# Patient Record
Sex: Female | Born: 1955 | Race: Black or African American | Hispanic: No | Marital: Single | Smoking: Never smoker
Health system: Southern US, Community
[De-identification: ages and names within clinical notes are randomized; demographics above are authoritative.]

## PROBLEM LIST (undated history)

## (undated) DIAGNOSIS — Z8659 Personal history of other mental and behavioral disorders: Secondary | ICD-10-CM

## (undated) DIAGNOSIS — F209 Schizophrenia, unspecified: Secondary | ICD-10-CM

## (undated) DIAGNOSIS — E109 Type 1 diabetes mellitus without complications: Secondary | ICD-10-CM

## (undated) DIAGNOSIS — E785 Hyperlipidemia, unspecified: Secondary | ICD-10-CM

## (undated) DIAGNOSIS — K219 Gastro-esophageal reflux disease without esophagitis: Secondary | ICD-10-CM

## (undated) DIAGNOSIS — D369 Benign neoplasm, unspecified site: Secondary | ICD-10-CM

## (undated) HISTORY — DX: Type 1 diabetes mellitus without complications: E10.9

## (undated) HISTORY — DX: Gastro-esophageal reflux disease without esophagitis: K21.9

## (undated) HISTORY — DX: Hyperlipidemia, unspecified: E78.5

## (undated) HISTORY — DX: Benign neoplasm, unspecified site: D36.9

## (undated) HISTORY — DX: Personal history of other mental and behavioral disorders: Z86.59

---

## 2002-05-18 ENCOUNTER — Other Ambulatory Visit: Admission: RE | Admit: 2002-05-18 | Discharge: 2002-05-18 | Payer: Self-pay | Admitting: Family Medicine

## 2002-10-11 ENCOUNTER — Encounter: Payer: Self-pay | Admitting: Family Medicine

## 2002-10-11 ENCOUNTER — Ambulatory Visit (HOSPITAL_COMMUNITY): Admission: RE | Admit: 2002-10-11 | Discharge: 2002-10-11 | Payer: Self-pay | Admitting: Family Medicine

## 2003-12-05 ENCOUNTER — Ambulatory Visit: Payer: Self-pay | Admitting: Family Medicine

## 2004-05-13 ENCOUNTER — Ambulatory Visit: Payer: Self-pay | Admitting: Family Medicine

## 2004-05-20 ENCOUNTER — Ambulatory Visit (HOSPITAL_COMMUNITY): Admission: RE | Admit: 2004-05-20 | Discharge: 2004-05-20 | Payer: Self-pay | Admitting: Family Medicine

## 2004-06-25 ENCOUNTER — Ambulatory Visit: Payer: Self-pay | Admitting: Family Medicine

## 2004-07-17 ENCOUNTER — Encounter (INDEPENDENT_AMBULATORY_CARE_PROVIDER_SITE_OTHER): Payer: Self-pay | Admitting: General Surgery

## 2004-07-17 ENCOUNTER — Ambulatory Visit (HOSPITAL_COMMUNITY): Admission: RE | Admit: 2004-07-17 | Discharge: 2004-07-17 | Payer: Self-pay | Admitting: General Surgery

## 2004-08-07 ENCOUNTER — Ambulatory Visit: Payer: Self-pay | Admitting: Family Medicine

## 2004-08-12 ENCOUNTER — Ambulatory Visit (HOSPITAL_COMMUNITY): Admission: RE | Admit: 2004-08-12 | Discharge: 2004-08-12 | Payer: Self-pay | Admitting: Family Medicine

## 2005-03-11 ENCOUNTER — Ambulatory Visit: Payer: Self-pay | Admitting: Family Medicine

## 2005-04-06 ENCOUNTER — Ambulatory Visit: Payer: Self-pay | Admitting: Family Medicine

## 2005-04-08 ENCOUNTER — Ambulatory Visit (HOSPITAL_COMMUNITY): Admission: RE | Admit: 2005-04-08 | Discharge: 2005-04-08 | Payer: Self-pay | Admitting: Family Medicine

## 2005-08-12 ENCOUNTER — Other Ambulatory Visit: Admission: RE | Admit: 2005-08-12 | Discharge: 2005-08-12 | Payer: Self-pay | Admitting: Family Medicine

## 2005-08-12 ENCOUNTER — Encounter: Payer: Self-pay | Admitting: Family Medicine

## 2005-08-12 ENCOUNTER — Encounter (INDEPENDENT_AMBULATORY_CARE_PROVIDER_SITE_OTHER): Payer: Self-pay | Admitting: *Deleted

## 2005-08-12 ENCOUNTER — Ambulatory Visit: Payer: Self-pay | Admitting: Family Medicine

## 2005-08-18 ENCOUNTER — Ambulatory Visit (HOSPITAL_COMMUNITY): Admission: RE | Admit: 2005-08-18 | Discharge: 2005-08-18 | Payer: Self-pay | Admitting: Family Medicine

## 2005-10-02 ENCOUNTER — Encounter (HOSPITAL_COMMUNITY): Admission: RE | Admit: 2005-10-02 | Discharge: 2005-10-17 | Payer: Self-pay | Admitting: Family Medicine

## 2005-11-12 ENCOUNTER — Ambulatory Visit: Payer: Self-pay | Admitting: Family Medicine

## 2006-02-17 ENCOUNTER — Ambulatory Visit: Payer: Self-pay | Admitting: Family Medicine

## 2006-02-17 LAB — CONVERTED CEMR LAB: Microalb, Ur: 0.44 mg/dL (ref 0.00–1.89)

## 2006-02-25 ENCOUNTER — Encounter (INDEPENDENT_AMBULATORY_CARE_PROVIDER_SITE_OTHER): Payer: Self-pay | Admitting: *Deleted

## 2006-02-25 ENCOUNTER — Other Ambulatory Visit: Admission: RE | Admit: 2006-02-25 | Discharge: 2006-02-25 | Payer: Self-pay | Admitting: General Surgery

## 2006-05-19 ENCOUNTER — Ambulatory Visit: Payer: Self-pay | Admitting: Family Medicine

## 2006-05-26 ENCOUNTER — Ambulatory Visit: Payer: Self-pay | Admitting: Family Medicine

## 2006-05-28 ENCOUNTER — Encounter: Payer: Self-pay | Admitting: Family Medicine

## 2006-05-28 LAB — CONVERTED CEMR LAB
ALT: 10 units/L (ref 0–35)
AST: 14 units/L (ref 0–37)
Albumin: 3.8 g/dL (ref 3.5–5.2)
Basophils Absolute: 0 10*3/uL (ref 0.0–0.1)
Bilirubin, Direct: <0.1 mg/dL (ref 0.0–0.3)
CO2: 32 meq/L (ref 19–32)
Calcium: 9.3 mg/dL (ref 8.4–10.5)
Cholesterol: 102 mg/dL (ref 0–200)
Eosinophils Absolute: 0.1 10*3/uL (ref 0.0–0.7)
Eosinophils Relative: 2 % (ref 0–5)
Glucose, Bld: 81 mg/dL (ref 70–99)
HCT: 37.7 % (ref 36.0–46.0)
HDL: 43 mg/dL (ref >39)
Hgb A1c MFr Bld: 6.2 % — ABNORMAL HIGH (ref 4.6–6.1)
Lymphocytes Relative: 46 % (ref 12–46)
Lymphs Abs: 1.9 10*3/uL (ref 0.7–3.3)
Neutrophils Relative %: 43 % (ref 43–77)
Platelets: 331 10*3/uL (ref 150–400)
Potassium: 3.7 meq/L (ref 3.5–5.3)
RDW: 13.4 % (ref 11.5–14.0)
Sodium: 141 meq/L (ref 135–145)
TSH: 1.233 microintl units/mL (ref 0.350–5.50)
Total CHOL/HDL Ratio: 2.4
VLDL: 16 mg/dL (ref 0–40)
WBC: 4.1 10*3/uL (ref 4.0–10.5)

## 2006-06-04 ENCOUNTER — Ambulatory Visit (HOSPITAL_COMMUNITY): Admission: RE | Admit: 2006-06-04 | Discharge: 2006-06-04 | Payer: Self-pay | Admitting: Family Medicine

## 2006-08-03 ENCOUNTER — Ambulatory Visit: Payer: Self-pay | Admitting: Family Medicine

## 2006-08-18 ENCOUNTER — Other Ambulatory Visit: Admission: RE | Admit: 2006-08-18 | Discharge: 2006-08-18 | Payer: Self-pay | Admitting: Family Medicine

## 2006-08-18 ENCOUNTER — Encounter: Payer: Self-pay | Admitting: Family Medicine

## 2006-08-18 ENCOUNTER — Ambulatory Visit: Payer: Self-pay | Admitting: Family Medicine

## 2006-08-18 ENCOUNTER — Encounter (INDEPENDENT_AMBULATORY_CARE_PROVIDER_SITE_OTHER): Payer: Self-pay | Admitting: *Deleted

## 2006-09-10 ENCOUNTER — Ambulatory Visit (HOSPITAL_COMMUNITY): Admission: RE | Admit: 2006-09-10 | Discharge: 2006-09-10 | Payer: Self-pay | Admitting: Family Medicine

## 2006-09-10 ENCOUNTER — Ambulatory Visit: Payer: Self-pay | Admitting: Family Medicine

## 2006-10-13 ENCOUNTER — Ambulatory Visit: Payer: Self-pay | Admitting: Family Medicine

## 2006-11-18 ENCOUNTER — Ambulatory Visit: Payer: Self-pay | Admitting: Family Medicine

## 2006-11-19 ENCOUNTER — Encounter: Payer: Self-pay | Admitting: Family Medicine

## 2006-11-19 LAB — CONVERTED CEMR LAB
ALT: 10 units/L (ref 0–35)
AST: 13 units/L (ref 0–37)
Albumin: 4.2 g/dL (ref 3.5–5.2)
Alkaline Phosphatase: 39 units/L (ref 39–117)
BUN: 8 mg/dL (ref 6–23)
Calcium: 9.2 mg/dL (ref 8.4–10.5)
Cholesterol: 115 mg/dL (ref 0–200)
Creatinine, Ser: 0.64 mg/dL (ref 0.40–1.20)
HDL: 44 mg/dL (ref >39)
Indirect Bilirubin: 0.2 mg/dL (ref 0.0–0.9)
Total Protein: 7.1 g/dL (ref 6.0–8.3)
Triglycerides: 75 mg/dL (ref <150)

## 2006-12-09 ENCOUNTER — Ambulatory Visit: Payer: Self-pay | Admitting: Family Medicine

## 2007-02-17 ENCOUNTER — Ambulatory Visit: Payer: Self-pay | Admitting: Family Medicine

## 2007-04-22 ENCOUNTER — Inpatient Hospital Stay: Payer: Self-pay | Admitting: Unknown Physician Specialty

## 2007-05-31 ENCOUNTER — Ambulatory Visit: Payer: Self-pay | Admitting: Family Medicine

## 2007-06-01 ENCOUNTER — Encounter: Payer: Self-pay | Admitting: Family Medicine

## 2007-06-01 LAB — CONVERTED CEMR LAB: Microalb, Ur: 0.25 mg/dL (ref 0.00–1.89)

## 2007-06-02 ENCOUNTER — Encounter (INDEPENDENT_AMBULATORY_CARE_PROVIDER_SITE_OTHER): Payer: Self-pay | Admitting: *Deleted

## 2007-06-02 DIAGNOSIS — E785 Hyperlipidemia, unspecified: Secondary | ICD-10-CM

## 2007-06-03 ENCOUNTER — Encounter: Payer: Self-pay | Admitting: Family Medicine

## 2007-06-03 LAB — CONVERTED CEMR LAB
AST: 13 units/L (ref 0–37)
Alkaline Phosphatase: 44 units/L (ref 39–117)
Basophils Absolute: 0 10*3/uL (ref 0.0–0.1)
Bilirubin, Direct: 0.1 mg/dL (ref 0.0–0.3)
CO2: 22 meq/L (ref 19–32)
Calcium: 9.3 mg/dL (ref 8.4–10.5)
Cholesterol: 119 mg/dL (ref 0–200)
Creatinine, Ser: 0.72 mg/dL (ref 0.40–1.20)
Eosinophils Absolute: 0 10*3/uL (ref 0.0–0.7)
Eosinophils Relative: 1 % (ref 0–5)
Glucose, Bld: 101 mg/dL — ABNORMAL HIGH (ref 70–99)
HCT: 43.4 % (ref 36.0–46.0)
HDL: 44 mg/dL (ref >39)
Hemoglobin: 14 g/dL (ref 12.0–15.0)
Lymphocytes Relative: 33 % (ref 12–46)
MCHC: 32.3 g/dL (ref 30.0–36.0)
MCV: 94.1 fL (ref 78.0–100.0)
Monocytes Absolute: 0.6 10*3/uL (ref 0.1–1.0)
RDW: 12.5 % (ref 11.5–15.5)
Total Bilirubin: 0.3 mg/dL (ref 0.3–1.2)

## 2007-06-07 ENCOUNTER — Ambulatory Visit (HOSPITAL_COMMUNITY): Admission: RE | Admit: 2007-06-07 | Discharge: 2007-06-07 | Payer: Self-pay | Admitting: Unknown Physician Specialty

## 2007-08-03 ENCOUNTER — Ambulatory Visit: Payer: Self-pay | Admitting: Family Medicine

## 2007-08-17 ENCOUNTER — Encounter: Payer: Self-pay | Admitting: Family Medicine

## 2007-08-17 ENCOUNTER — Other Ambulatory Visit: Admission: RE | Admit: 2007-08-17 | Discharge: 2007-08-17 | Payer: Self-pay | Admitting: Family Medicine

## 2007-08-17 ENCOUNTER — Ambulatory Visit: Payer: Self-pay | Admitting: Family Medicine

## 2007-08-17 ENCOUNTER — Encounter (INDEPENDENT_AMBULATORY_CARE_PROVIDER_SITE_OTHER): Payer: Self-pay | Admitting: *Deleted

## 2007-08-17 LAB — CONVERTED CEMR LAB
Blood in Urine, dipstick: NEGATIVE
Ketones, urine, test strip: NEGATIVE
Nitrite: NEGATIVE
Urobilinogen, UA: NEGATIVE

## 2007-08-18 ENCOUNTER — Encounter: Payer: Self-pay | Admitting: Family Medicine

## 2007-08-18 LAB — CONVERTED CEMR LAB
Candida species: POSITIVE — AB
Chlamydia, DNA Probe: NEGATIVE
GC Probe Amp, Genital: NEGATIVE
Gardnerella vaginalis: NEGATIVE
Trichomonal Vaginitis: NEGATIVE

## 2007-08-24 ENCOUNTER — Encounter: Payer: Self-pay | Admitting: Family Medicine

## 2007-09-08 ENCOUNTER — Encounter: Payer: Self-pay | Admitting: Family Medicine

## 2007-10-28 ENCOUNTER — Encounter: Payer: Self-pay | Admitting: Family Medicine

## 2007-11-07 ENCOUNTER — Ambulatory Visit: Payer: Self-pay | Admitting: Family Medicine

## 2007-11-07 DIAGNOSIS — E119 Type 2 diabetes mellitus without complications: Secondary | ICD-10-CM

## 2007-11-07 LAB — CONVERTED CEMR LAB
Blood in Urine, dipstick: NEGATIVE
Nitrite: NEGATIVE
Protein, U semiquant: NEGATIVE
WBC Urine, dipstick: NEGATIVE
pH: 7

## 2007-11-25 ENCOUNTER — Encounter: Payer: Self-pay | Admitting: Family Medicine

## 2007-11-25 LAB — CONVERTED CEMR LAB
Albumin: 4.3 g/dL (ref 3.5–5.2)
Alkaline Phosphatase: 43 units/L (ref 39–117)
BUN: 13 mg/dL (ref 6–23)
CO2: 27 meq/L (ref 19–32)
Chloride: 104 meq/L (ref 96–112)
Creatinine, Ser: 0.92 mg/dL (ref 0.40–1.20)
HDL: 48 mg/dL (ref >39)
LDL Cholesterol: 60 mg/dL (ref 0–99)
Total Bilirubin: 0.3 mg/dL (ref 0.3–1.2)

## 2007-12-29 ENCOUNTER — Encounter: Payer: Self-pay | Admitting: Family Medicine

## 2008-01-30 ENCOUNTER — Encounter: Payer: Self-pay | Admitting: Family Medicine

## 2008-02-27 ENCOUNTER — Encounter: Payer: Self-pay | Admitting: Family Medicine

## 2008-03-13 ENCOUNTER — Ambulatory Visit: Payer: Self-pay | Admitting: Family Medicine

## 2008-03-13 LAB — CONVERTED CEMR LAB
Glucose, Bld: 135 mg/dL
Hgb A1c MFr Bld: 7.3 %

## 2008-03-14 ENCOUNTER — Encounter: Payer: Self-pay | Admitting: Family Medicine

## 2008-03-24 DIAGNOSIS — J309 Allergic rhinitis, unspecified: Secondary | ICD-10-CM | POA: Insufficient documentation

## 2008-03-27 ENCOUNTER — Encounter: Payer: Self-pay | Admitting: Family Medicine

## 2008-04-24 ENCOUNTER — Encounter: Payer: Self-pay | Admitting: Family Medicine

## 2008-04-24 LAB — CONVERTED CEMR LAB
ALT: 17 units/L (ref 0–35)
AST: 19 units/L (ref 0–37)
Albumin: 4.1 g/dL (ref 3.5–5.2)
Alkaline Phosphatase: 46 units/L (ref 39–117)
Basophils Absolute: 0 10*3/uL (ref 0.0–0.1)
Basophils Relative: 0 % (ref 0–1)
Calcium: 10.5 mg/dL (ref 8.4–10.5)
Cholesterol: 121 mg/dL (ref 0–200)
Creatinine, Urine: 112.3 mg/dL
HDL: 39 mg/dL — ABNORMAL LOW (ref >39)
MCHC: 33.3 g/dL (ref 30.0–36.0)
Microalb, Ur: 0.5 mg/dL (ref 0.00–1.89)
Neutro Abs: 3.5 10*3/uL (ref 1.7–7.7)
Neutrophils Relative %: 52 % (ref 43–77)
Platelets: 317 10*3/uL (ref 150–400)
RBC: 4.24 M/uL (ref 3.87–5.11)
RDW: 12.8 % (ref 11.5–15.5)
Sodium: 140 meq/L (ref 135–145)
Total CHOL/HDL Ratio: 3.1

## 2008-05-01 ENCOUNTER — Encounter: Payer: Self-pay | Admitting: Family Medicine

## 2008-05-14 ENCOUNTER — Encounter: Payer: Self-pay | Admitting: Family Medicine

## 2008-05-16 ENCOUNTER — Encounter: Payer: Self-pay | Admitting: Family Medicine

## 2008-05-19 DIAGNOSIS — D369 Benign neoplasm, unspecified site: Secondary | ICD-10-CM

## 2008-05-19 HISTORY — PX: COLONOSCOPY: SHX174

## 2008-05-19 HISTORY — DX: Benign neoplasm, unspecified site: D36.9

## 2008-05-23 ENCOUNTER — Encounter: Payer: Self-pay | Admitting: Gastroenterology

## 2008-06-12 ENCOUNTER — Encounter: Payer: Self-pay | Admitting: Gastroenterology

## 2008-06-12 ENCOUNTER — Ambulatory Visit: Payer: Self-pay | Admitting: Gastroenterology

## 2008-06-12 ENCOUNTER — Ambulatory Visit (HOSPITAL_COMMUNITY): Admission: RE | Admit: 2008-06-12 | Discharge: 2008-06-12 | Payer: Self-pay | Admitting: Gastroenterology

## 2008-06-13 ENCOUNTER — Encounter: Payer: Self-pay | Admitting: Gastroenterology

## 2008-07-06 ENCOUNTER — Encounter: Payer: Self-pay | Admitting: Family Medicine

## 2008-07-11 ENCOUNTER — Encounter: Payer: Self-pay | Admitting: Family Medicine

## 2008-07-12 ENCOUNTER — Ambulatory Visit: Payer: Self-pay | Admitting: Family Medicine

## 2008-07-12 DIAGNOSIS — R5383 Other fatigue: Secondary | ICD-10-CM

## 2008-07-12 DIAGNOSIS — M79609 Pain in unspecified limb: Secondary | ICD-10-CM

## 2008-07-12 DIAGNOSIS — R5381 Other malaise: Secondary | ICD-10-CM

## 2008-07-12 LAB — CONVERTED CEMR LAB: Hgb A1c MFr Bld: 6.9 %

## 2008-07-25 LAB — CONVERTED CEMR LAB
Alkaline Phosphatase: 45 units/L (ref 39–117)
BUN: 10 mg/dL (ref 6–23)
Bilirubin, Direct: 0.1 mg/dL (ref 0.0–0.3)
Chloride: 104 meq/L (ref 96–112)
Cholesterol: 105 mg/dL (ref 0–200)
Creatinine, Ser: 0.84 mg/dL (ref 0.40–1.20)
Glucose, Bld: 94 mg/dL (ref 70–99)
Indirect Bilirubin: 0.2 mg/dL (ref 0.0–0.9)
LDL Cholesterol: 54 mg/dL (ref 0–99)
Total Protein: 7 g/dL (ref 6.0–8.3)
Triglycerides: 80 mg/dL (ref <150)

## 2008-09-13 ENCOUNTER — Ambulatory Visit: Payer: Self-pay | Admitting: Gastroenterology

## 2008-09-13 DIAGNOSIS — K5900 Constipation, unspecified: Secondary | ICD-10-CM

## 2008-09-13 DIAGNOSIS — R112 Nausea with vomiting, unspecified: Secondary | ICD-10-CM

## 2008-09-13 HISTORY — DX: Constipation, unspecified: K59.00

## 2008-09-14 ENCOUNTER — Encounter: Payer: Self-pay | Admitting: Gastroenterology

## 2008-09-17 ENCOUNTER — Encounter (HOSPITAL_COMMUNITY): Admission: RE | Admit: 2008-09-17 | Discharge: 2008-10-17 | Payer: Self-pay | Admitting: Gastroenterology

## 2008-09-18 ENCOUNTER — Encounter (INDEPENDENT_AMBULATORY_CARE_PROVIDER_SITE_OTHER): Payer: Self-pay

## 2008-09-18 ENCOUNTER — Encounter: Payer: Self-pay | Admitting: Gastroenterology

## 2008-09-28 ENCOUNTER — Encounter: Payer: Self-pay | Admitting: Family Medicine

## 2008-10-11 ENCOUNTER — Encounter: Payer: Self-pay | Admitting: Family Medicine

## 2008-10-12 ENCOUNTER — Ambulatory Visit: Payer: Self-pay | Admitting: Family Medicine

## 2008-10-12 DIAGNOSIS — N3000 Acute cystitis without hematuria: Secondary | ICD-10-CM

## 2008-10-12 LAB — CONVERTED CEMR LAB
Blood in Urine, dipstick: NEGATIVE
Glucose, Urine, Semiquant: NEGATIVE
Hgb A1c MFr Bld: 6.5 %
Ketones, urine, test strip: NEGATIVE
Protein, U semiquant: NEGATIVE
WBC Urine, dipstick: NEGATIVE
pH: 7

## 2008-10-16 ENCOUNTER — Ambulatory Visit (HOSPITAL_COMMUNITY): Admission: RE | Admit: 2008-10-16 | Discharge: 2008-10-16 | Payer: Self-pay | Admitting: Family Medicine

## 2008-11-06 ENCOUNTER — Encounter: Payer: Self-pay | Admitting: Urgent Care

## 2008-11-22 LAB — CONVERTED CEMR LAB
ALT: 23 units/L (ref 0–35)
BUN: 7 mg/dL (ref 6–23)
Basophils Absolute: 0 10*3/uL (ref 0.0–0.1)
Bilirubin, Direct: <0.1 mg/dL (ref 0.0–0.3)
Chloride: 104 meq/L (ref 96–112)
Cholesterol: 117 mg/dL (ref 0–200)
Eosinophils Relative: 2 % (ref 0–5)
Glucose, Bld: 107 mg/dL — ABNORMAL HIGH (ref 70–99)
HCT: 38.3 % (ref 36.0–46.0)
Hemoglobin: 12.3 g/dL (ref 12.0–15.0)
LDL Cholesterol: 63 mg/dL (ref 0–99)
Lymphocytes Relative: 44 % (ref 12–46)
Lymphs Abs: 2.6 10*3/uL (ref 0.7–4.0)
Monocytes Absolute: 0.8 10*3/uL (ref 0.1–1.0)
Monocytes Relative: 13 % — ABNORMAL HIGH (ref 3–12)
Neutro Abs: 2.3 10*3/uL (ref 1.7–7.7)
Potassium: 4.4 meq/L (ref 3.5–5.3)
RBC: 4.09 M/uL (ref 3.87–5.11)
Sodium: 143 meq/L (ref 135–145)
TSH: 2.148 microintl units/mL (ref 0.350–4.500)
Total CHOL/HDL Ratio: 3.1
Total Protein: 7 g/dL (ref 6.0–8.3)
VLDL: 16 mg/dL (ref 0–40)
WBC: 5.8 10*3/uL (ref 4.0–10.5)

## 2008-11-30 ENCOUNTER — Telehealth: Payer: Self-pay | Admitting: Family Medicine

## 2008-12-04 ENCOUNTER — Encounter: Payer: Self-pay | Admitting: Family Medicine

## 2008-12-11 ENCOUNTER — Ambulatory Visit: Payer: Self-pay | Admitting: Gastroenterology

## 2008-12-11 DIAGNOSIS — Z860100 Personal history of colon polyps, unspecified: Secondary | ICD-10-CM | POA: Insufficient documentation

## 2008-12-11 HISTORY — DX: Personal history of colonic polyps: Z86.010

## 2008-12-11 HISTORY — DX: Personal history of colon polyps, unspecified: Z86.0100

## 2008-12-12 DIAGNOSIS — K219 Gastro-esophageal reflux disease without esophagitis: Secondary | ICD-10-CM | POA: Insufficient documentation

## 2008-12-27 ENCOUNTER — Encounter: Payer: Self-pay | Admitting: Family Medicine

## 2009-01-15 ENCOUNTER — Ambulatory Visit: Payer: Self-pay | Admitting: Family Medicine

## 2009-01-15 ENCOUNTER — Other Ambulatory Visit: Admission: RE | Admit: 2009-01-15 | Discharge: 2009-01-15 | Payer: Self-pay | Admitting: Family Medicine

## 2009-01-30 ENCOUNTER — Encounter: Payer: Self-pay | Admitting: Gastroenterology

## 2009-01-30 ENCOUNTER — Encounter: Payer: Self-pay | Admitting: Family Medicine

## 2009-02-12 ENCOUNTER — Encounter: Payer: Self-pay | Admitting: Family Medicine

## 2009-02-22 LAB — CONVERTED CEMR LAB
ALT: 15 units/L (ref 0–35)
Albumin: 4 g/dL (ref 3.5–5.2)
Alkaline Phosphatase: 51 units/L (ref 39–117)
BUN: 9 mg/dL (ref 6–23)
Cholesterol: 116 mg/dL (ref 0–200)
Creatinine, Ser: 0.84 mg/dL (ref 0.40–1.20)
HDL: 35 mg/dL — ABNORMAL LOW (ref >39)
Indirect Bilirubin: 0.1 mg/dL (ref 0.0–0.9)
Potassium: 4.4 meq/L (ref 3.5–5.3)
Total Protein: 7 g/dL (ref 6.0–8.3)
Triglycerides: 84 mg/dL (ref <150)

## 2009-04-15 ENCOUNTER — Ambulatory Visit: Payer: Self-pay | Admitting: Family Medicine

## 2009-04-15 LAB — HM DIABETES FOOT EXAM

## 2009-04-15 LAB — CONVERTED CEMR LAB: Glucose, Bld: 103 mg/dL

## 2009-04-26 ENCOUNTER — Ambulatory Visit: Payer: Self-pay | Admitting: Gastroenterology

## 2009-04-29 ENCOUNTER — Telehealth: Payer: Self-pay | Admitting: Family Medicine

## 2009-04-29 DIAGNOSIS — R1084 Generalized abdominal pain: Secondary | ICD-10-CM | POA: Insufficient documentation

## 2009-05-01 ENCOUNTER — Encounter: Payer: Self-pay | Admitting: Family Medicine

## 2009-06-03 ENCOUNTER — Telehealth: Payer: Self-pay | Admitting: Family Medicine

## 2009-06-05 ENCOUNTER — Encounter: Payer: Self-pay | Admitting: Family Medicine

## 2009-07-16 ENCOUNTER — Ambulatory Visit: Payer: Self-pay | Admitting: Family Medicine

## 2009-07-16 DIAGNOSIS — R110 Nausea: Secondary | ICD-10-CM

## 2009-07-16 LAB — CONVERTED CEMR LAB: Microalb Creat Ratio: 3.8 mg/g (ref 0.0–30.0)

## 2009-07-18 LAB — CONVERTED CEMR LAB: Hgb A1c MFr Bld: 6.5 % — ABNORMAL HIGH (ref <5.7)

## 2009-07-22 DIAGNOSIS — F209 Schizophrenia, unspecified: Secondary | ICD-10-CM | POA: Insufficient documentation

## 2009-07-22 HISTORY — DX: Schizophrenia, unspecified: F20.9

## 2009-10-22 ENCOUNTER — Ambulatory Visit (HOSPITAL_COMMUNITY)
Admission: RE | Admit: 2009-10-22 | Discharge: 2009-10-22 | Payer: Self-pay | Source: Home / Self Care | Admitting: Family Medicine

## 2009-11-06 ENCOUNTER — Ambulatory Visit: Payer: Self-pay | Admitting: Family Medicine

## 2009-11-06 DIAGNOSIS — M549 Dorsalgia, unspecified: Secondary | ICD-10-CM | POA: Insufficient documentation

## 2009-11-19 LAB — CONVERTED CEMR LAB
Albumin: 4.3 g/dL (ref 3.5–5.2)
Alkaline Phosphatase: 52 units/L (ref 39–117)
Chloride: 103 meq/L (ref 96–112)
HDL: 39 mg/dL — ABNORMAL LOW (ref >39)
Hgb A1c MFr Bld: 6.6 % — ABNORMAL HIGH (ref <5.7)
LDL Cholesterol: 71 mg/dL (ref 0–99)
Potassium: 4.3 meq/L (ref 3.5–5.3)
Sodium: 140 meq/L (ref 135–145)
Total CHOL/HDL Ratio: 3.2
Total Protein: 7.2 g/dL (ref 6.0–8.3)
Triglycerides: 78 mg/dL (ref <150)
VLDL: 16 mg/dL (ref 0–40)

## 2009-12-10 ENCOUNTER — Ambulatory Visit: Payer: Self-pay | Admitting: Family Medicine

## 2010-01-28 ENCOUNTER — Encounter: Payer: Self-pay | Admitting: Urgent Care

## 2010-02-03 HISTORY — PX: MULTIPLE TOOTH EXTRACTIONS: SHX2053

## 2010-02-04 ENCOUNTER — Ambulatory Visit (HOSPITAL_COMMUNITY)
Admission: RE | Admit: 2010-02-04 | Discharge: 2010-02-04 | Payer: Self-pay | Source: Home / Self Care | Attending: Oral Surgery | Admitting: Oral Surgery

## 2010-02-05 LAB — BASIC METABOLIC PANEL
BUN: 6 mg/dL (ref 6–23)
CO2: 30 mEq/L (ref 19–32)
Calcium: 9.9 mg/dL (ref 8.4–10.5)
Chloride: 107 mEq/L (ref 96–112)
Creatinine, Ser: 0.8 mg/dL (ref 0.4–1.2)
GFR calc Af Amer: >60 mL/min (ref >60)
GFR calc non Af Amer: >60 mL/min (ref >60)
Glucose, Bld: 100 mg/dL — ABNORMAL HIGH (ref 70–99)
Potassium: 4.2 mEq/L (ref 3.5–5.1)
Sodium: 143 mEq/L (ref 135–145)

## 2010-02-05 LAB — CBC
HCT: 37.2 % (ref 36.0–46.0)
Hemoglobin: 12.1 g/dL (ref 12.0–15.0)
MCH: 29.8 pg (ref 26.0–34.0)
MCHC: 32.5 g/dL (ref 30.0–36.0)
MCV: 91.6 fL (ref 78.0–100.0)
Platelets: 314 10*3/uL (ref 150–400)
RBC: 4.06 MIL/uL (ref 3.87–5.11)
RDW: 12.8 % (ref 11.5–15.5)
WBC: 5.6 10*3/uL (ref 4.0–10.5)

## 2010-02-05 LAB — GLUCOSE, CAPILLARY
Glucose-Capillary: 125 mg/dL — ABNORMAL HIGH (ref 70–99)
Glucose-Capillary: 111 mg/dL — ABNORMAL HIGH (ref 70–99)
Glucose-Capillary: 152 mg/dL — ABNORMAL HIGH (ref 70–99)

## 2010-02-09 ENCOUNTER — Encounter: Payer: Self-pay | Admitting: Family Medicine

## 2010-02-10 ENCOUNTER — Encounter: Payer: Self-pay | Admitting: Family Medicine

## 2010-02-10 NOTE — Op Note (Signed)
NAME:  NANAKO, STOPHER                 ACCOUNT NO.:  [id number]  MEDICAL RECORD NO.:  [id number]          PATIENT TYPE:  AMB  LOCATION:  SDS                          FACILITY:  [facility]  PHYSICIAN:  Georgia Lopes, M.D.  DATE OF BIRTH:  Apr 10, 1955  DATE OF PROCEDURE:  02/04/2010 DATE OF DISCHARGE:  02/04/2010                              OPERATIVE REPORT   PREOPERATIVE DIAGNOSES:  Nonrestorable teeth numbers 5, 6, 7, 8, 9, 10, 11, 12, 13, 20, 21, 22, 23, 24, 25, 26, 27, 28, 29, and 30.  POSTOPERATIVE DIAGNOSES:  Nonrestorable teeth numbers 5, 6, 7, 8, 9, 10, 11, 12, 13, 20, 21, 22, 23, 24, 25, 26, 27, 28, 29, and 30.  PROCEDURE:  Removal of teeth numbers 5, 6, 7, 8, 9, 10, 11, 12, 13, 20, 21, 22, 23, 24, 25, 26, 27, 28, 29, and 30, alveoplasty of maxilla and mandible.  SURGEON:  Georgia Lopes, MD  ANESTHESIA:  General nasal.  Dr. Gypsy Balsam attending.  ASSISTANTS:  Nixon and Cimler.  INDICATIONS FOR PROCEDURE:  Wendy Oneill is a 55 year old female who was referred to my office by her general dentist to remove multiple nonrestorable decayed teeth because of her past medical history which is significant for irregular heartbeat, diabetes, schizophrenia, and because of the amount of work needed and severity of the bone loss around teeth, it was recommended that the patient had the procedure done at hospital under general anesthesia so that the airway could be protected.  PROCEDURE:  The patient was taken to the operating room and placed on the table in supine position.  General anesthesia was administered intravenously and a nasal endotracheal tube was placed and secured.  The patient was draped for the procedure after the eyes were protected.  The posterior pharynx was suctioned.  A throat pack was placed.  Lidocaine 2% was infiltrated 1:100,000 epinephrine in an inferior alveolar block technique on the right and left side and buccal and palatal infiltration around the maxillary teeth.  A  total of 18 mL was utilized.  Then, a #15 blade was used to make a full-thickness incision around teeth numbers 20, 21, 22, 23, 24, 25, 26, and around teeth numbers 7, 8, 9, 10, 11, 12, and 13.  The periosteum was reflected buccally and palatally in the maxilla and buccally and lingually in the mandible.  Interproximal bone was removed with the handpiece with crosscut fissure bur under irrigation between all these teeth.  The teeth were elevated with a 301 elevator and several teeth were removed with the Ash forceps; however, several of teeth fractured during removal and required additional bone to be removed around the roots before the teeth could be removed.  After all the teeth were removed from the mouth, the curette was used to curette the tissues.  The periosteum was further reflected and an egg- shaped bur and a bone file was used to perform alveoplasty.  Then, the area was irrigated again and closed with 3-0 chromic.  Bite block was repositioned and a 15 blade was used to make a full-thickness incision around teeth numbers 5 and 6 and  around teeth numbers 27, 28, 29, and 30.  The periosteum was reflected.  Bone was removed interproximally, the teeth were elevated, and some of the teeth were removed with the forceps, however, the majority of the teeth fractured at the root and required removal of additional bone with the handpiece and then the teeth were eventually removed.  The sockets were curetted, irrigated, and then, alveoplasty was performed with egg-shaped bur and bone file and then the areas were irrigated, suctioned, and closed with 3-0 chromic.  There was no lesion noted at tooth #8 or any of the other extraction areas.  Then, the oral cavity was examined and inspected and found to have good closure and contour.  The posterior pharynx was suctioned, the Yankauer was used.  Then, the throat pack was removed. The patient was awakened and taken to the recovery room  breathing spontaneously in good condition.  ESTIMATED BLOOD LOSS:  Minimum.  COMPLICATIONS:  None.  SPECIMENS:  None.     Georgia Lopes, M.D.     SMJ/MEDQ  D:  02/04/2010  T:  02/05/2010  Job:  [id number]  Electronically Signed by Ocie Doyne M.D. on 02/10/2010 09:32:40 AM

## 2010-02-16 LAB — CONVERTED CEMR LAB
Glucose, Bld: 129 mg/dL
Hgb A1c MFr Bld: 6.8 %

## 2010-02-20 NOTE — Miscellaneous (Signed)
Summary: Home Care Report  Home Care Report   Imported By: Lind Guest 07/17/2009 14:30:12  _____________________________________________________________________  External Attachment:    Type:   Image     Comment:   External Document

## 2010-02-20 NOTE — Miscellaneous (Signed)
Summary: Home Care Report  Home Care Report   Imported By: Lind Guest 06/05/2009 10:29:49  _____________________________________________________________________  External Attachment:    Type:   Image     Comment:   External Document

## 2010-02-20 NOTE — Assessment & Plan Note (Signed)
Summary: f up   Vital Signs:  Patient profile:   55 year old female Menstrual status:  irregular Height:      62 inches Weight:      137.50 pounds O2 Sat:      98 % on Room air Pulse rate:   118 / minute Resp:     16 per minute BP sitting:   112 / 76  (left arm)  Vitals Entered By: Mauricia Area CMA (November 06, 2009 10:20 AM) CC: Sharp lower back pain, hurts off and on throughout the day   Primary Provider:  Lodema Hong, M.D.  CCLambert Mody lower back pain and hurts off and on throughout the day.  History of Present Illness: Pt states she has been having lower back pain x approx 1 week.  She first noticed this one morning when she got out of bed & back felt stiff and sore.  No injury. No prev hx of back problems. No radiation, loss of bowel or bladder control, or parasthesias of LE.  Pain is present with certain mvmts only.  Hx of DM and hyperlipidemia. Taking meds as prescribed.  Has lab order, but has not had drawn yet.      Allergies (verified): 1)  ! Risperdal (Risperidone) 2)  ! * Uncoated Asa 3)  ! * Haladol 4)  ! Sulfa  Past History:  Past medical history reviewed for relevance to current acute and chronic problems.  Past Medical History: Reviewed history from 04/26/2009 and no changes required. 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 CV:  Denies chest pain or discomfort and palpitations. Resp:  Denies cough and shortness of breath. GI:  Complains of constipation; denies abdominal pain, change in bowel habits, nausea, and vomiting. GU:  Denies dysuria and urinary frequency. MS:  Complains of low back pain; denies joint pain and muscle weakness.  Physical Exam  General:  Well-developed,well-nourished,in no acute distress; alert,appropriate and  cooperative throughout examination Head:  Normocephalic and atraumatic without obvious abnormalities. No apparent alopecia or balding. Ears:  External ear exam shows no significant lesions or deformities.  Otoscopic examination reveals clear canals, tympanic membranes are intact bilaterally without bulging, retraction, inflammation or discharge. Hearing is grossly normal bilaterally. Nose:  External nasal examination shows no deformity or inflammation. Nasal mucosa are pink and moist without lesions or exudates. Mouth:  Oral mucosa and oropharynx without lesions or exudates.   Neck:  No deformities, masses, or tenderness noted. Lungs:  Normal respiratory effort, chest expands symmetrically. Lungs are clear to auscultation, no crackles or wheezes. Heart:  Normal rate and regular rhythm. S1 and S2 normal without gallop, murmur, click, rub or other extra sounds. Msk:  LS spine:  FROM.  mild TTP lumbar paraspinal muscles bilat, nontender SI and sciatic notches Extremities:  No clubbing, cyanosis, edema, or deformity noted with normal full range of motion of all joints.   Neurologic:  alert & oriented X3, strength normal in all extremities, gait normal, and DTRs symmetrical and normal.   Skin:  Intact without suspicious lesions or rashes Cervical Nodes:  No lymphadenopathy noted Psych:  Cognition and judgment appear intact. Alert and cooperative with normal attention span and concentration. No apparent delusions, illusions, hallucinations   Impression & Recommendations:  Problem # 1:  BACK PAIN (ICD-724.5) Assessment New  Her updated medication list for this problem  includes:    Aspirin 81 Mg Tbec (Aspirin) ..... One tab by mouth at bedtime    Ibuprofen 800 Mg Tabs (Ibuprofen) .Marland Kitchen... Take one tab three times a day as needed back pain  Orders: Depo- Medrol 80mg  (J1040) Admin of Therapeutic Inj  intramuscular or subcutaneous (16109) Ketorolac-Toradol 15mg  (U0454)  Problem # 2:  DIABETES  MELLITUS, TYPE II (ICD-250.00) Assessment: Comment Only  Her updated medication list for this problem includes:    Aspirin 81 Mg Tbec (Aspirin) ..... One tab by mouth at bedtime    Metformin Hcl 500 Mg Tabs (Metformin hcl) .Marland Kitchen... 2 tablets in the morning and 1 in the evening  Problem # 3:  HYPERLIPIDEMIA (ICD-272.4) Assessment: Comment Only  Her updated medication list for this problem includes:    Simvastatin 40 Mg Tabs (Simvastatin) ..... One tab by mouth at bedtime  Complete Medication List: 1)  Simvastatin 40 Mg Tabs (Simvastatin) .... One tab by mouth at bedtime 2)  Aspirin 81 Mg Tbec (Aspirin) .... One tab by mouth at bedtime 3)  Risperidone 2 Mg Tabs (Risperidone) .... One tablet am and  two tablets in  the pm 4)  Lexapro 10 Mg Tabs (Escitalopram oxalate) .... One tab by mouth once daily 5)  Oyster Shell Calcium/d 500-200 Mg-unit Tabs (Calcium-vitamin d) .... Take 1 tablet by mouth three times a day 6)  Loratadine 10 Mg Tabs (Loratadine) .... Take one tablet by mouth once a day 7)  Benztropine Mesylate 1 Mg Tabs (Benztropine mesylate) .... Take 1 tablet by mouth two times a day 8)  Metformin Hcl 500 Mg Tabs (Metformin hcl) .... 2 tablets in the morning and 1 in the evening 9)  Diabetic Shoes  .... W/ inserts x 1 pair dx: 250.00 10)  Clotrimazole-betamethasone 1-0.05 % Crea (Clotrimazole-betamethasone) .... Apply to the soles of both feet two times a day 11)  Omeprazole 20 Mg Cpdr (Omeprazole) .... 30 minutes prior to breakfast 12)  Docusate Sodium 100 Mg Caps (Docusate sodium) .... Take 1 tablet by mouth two times a day 13)  Ibuprofen 800 Mg Tabs (Ibuprofen) .... Take one tab three times a day as needed back pain  Patient Instructions: 1)  Please schedule a follow-up appointment in 1 month. 2)  Have blood work drawn fasting before your next appt. 3)  You have received Depo Medrol and Toradol injections today for your back pain. 4)  I have prescribed Ibuprofen to use as needed  for back pain. Prescriptions: IBUPROFEN 800 MG TABS (IBUPROFEN) take one tab three times a day as needed back pain  #30 x 0   Entered and Authorized by:   Esperanza Sheets PA   Signed by:   Esperanza Sheets PA on 11/06/2009   Method used:   Electronically to        [facility] (retail)       [address]       [place], [place]  [address]       Ph: [phone]       Fax: [phone]   RxID:   [id number]    Medication Administration  Injection # 1:    Medication: Depo- Medrol 80mg     Diagnosis: BACK PAIN (ICD-724.5)    Route: IM    Site: LUOQ gluteus    Exp Date: 06/2010    Lot #: [id number]    Mfr: Pharmacia    Comments: 80 mg given    Patient tolerated injection  without complications    Given by: Mauricia Area CMA (November 06, 2009 11:04 AM)  Injection # 2:    Medication: Ketorolac-Toradol 15mg     Diagnosis: BACK PAIN (ICD-724.5)    Route: IM    Site: RUOQ gluteus    Exp Date: 11/20/2010    Lot #: [id number]    Mfr: novaplus    Comments: 60 mg given    Patient tolerated injection without complications    Given by: Mauricia Area CMA (November 06, 2009 11:05 AM)  Orders Added: 1)  Depo- Medrol 80mg  [J1040] 2)  Admin of Therapeutic Inj  intramuscular or subcutaneous [96372] 3)  Ketorolac-Toradol 15mg  [J1885] 4)  Est. Patient Level III [16109]

## 2010-02-20 NOTE — Miscellaneous (Signed)
Summary: Home Care Report  Home Care Report   Imported By: Lind Guest 05/01/2009 09:32:02  _____________________________________________________________________  External Attachment:    Type:   Image     Comment:   External Document

## 2010-02-20 NOTE — Assessment & Plan Note (Signed)
Summary: office visit   Vital Signs:  Patient profile:   55 year old female Menstrual status:  irregular Height:      62 inches Weight:      131.75 pounds BMI:     24.18 O2 Sat:      97 % on Room air Pulse rate:   106 / minute Pulse rhythm:   regular Resp:     16 per minute BP sitting:   100 / 70  (left arm)  Vitals Entered By: Adella Hare LPN (December 10, 2009 10:06 AM)  O2 Flow:  Room air CC: follow-up visit Is Patient Diabetic? Yes Did you bring your meter with you today? No Pain Assessment Patient in pain? no        Primary Care Provider:  Lodema Hong, M.D.  CC:  follow-up visit.  History of Present Illness: Pt has upcoming dental work for extraction of her teeth.she has occasional back pain, but less severe than befor. she is here for clearance before her oral surgery. Reports  that she has been  doing well. Denies recent fever or chills. Denies sinus pressure, nasal congestion , ear pain or sore throat. Denies chest congestion, or cough productive of sputum. Denies chest pain, palpitations, PND, orthopnea or leg swelling. Denies abdominal pain, nausea, vomitting, diarrhea or constipation. Denies change in bowel movements or bloody stool. Denies dysuria , frequency, incontinence or hesitancy. Denies significant joint pain, swelling, or reduced mobility. Denies headaches, vertigo, seizures. Denies depression, anxiety or insomnia. Denies  rash, lesions, or itch.     Current Medications (verified): 1)  Simvastatin 40 Mg  Tabs (Simvastatin) .... One Tab By Mouth At Bedtime 2)  Aspirin 81 Mg  Tbec (Aspirin) .... One Tab By Mouth At Bedtime 3)  Risperidone 2 Mg  Tabs (Risperidone) .... One Tablet Am and  Two Tablets in  The Pm 4)  Lexapro 10 Mg  Tabs (Escitalopram Oxalate) .... One Tab By Mouth Once Daily 5)  Oyster Shell Calcium/d 500-200 Mg-Unit Tabs (Calcium-Vitamin D) .... Take 1 Tablet By Mouth Three Times A Day 6)  Loratadine 10 Mg Tabs (Loratadine) ....  Take One Tablet By Mouth Once A Day 7)  Benztropine Mesylate 1 Mg Tabs (Benztropine Mesylate) .... Take 1 Tablet By Mouth Two Times A Day 8)  Metformin Hcl 500 Mg Tabs (Metformin Hcl) .... 2 Tablets in The Morning and 1 in The Evening 9)  Diabetic Shoes .... W/ Inserts X 1 Pair Dx: 250.00 10)  Clotrimazole-Betamethasone 1-0.05 % Crea (Clotrimazole-Betamethasone) .... Apply To The Soles of Both Feet Two Times A Day 11)  Omeprazole 20 Mg Cpdr (Omeprazole) .... 30 Minutes Prior To Breakfast 12)  Docusate Sodium 100 Mg Caps (Docusate Sodium) .... Take 1 Tablet By Mouth Two Times A Day 13)  Ibuprofen 800 Mg Tabs (Ibuprofen) .... Take One Tab Three Times A Day As Needed Back Pain  Allergies (verified): 1)  ! Risperdal (Risperidone) 2)  ! * Uncoated Asa 3)  ! * Haladol 4)  ! Sulfa  Review of Systems      See HPI General:  Denies fatigue, sweats, and weakness. Eyes:  Denies discharge, eye pain, and red eye. Psych:  Complains of mental problems. Endo:  Denies cold intolerance, excessive hunger, excessive thirst, and excessive urination. Heme:  Denies abnormal bruising and bleeding. Allergy:  Denies hives or rash and itching eyes.  Physical Exam  General:  Well-developed,well-nourished,in no acute distress; alert,appropriate and cooperative throughout examination HEENT: No facial asymmetry,  EOMI, No  sinus tenderness, TM's Clear, oropharynx  pink and moist.   Chest: Clear to auscultation bilaterally.  CVS: S1, S2, No murmurs, No S3.   Abd: Soft, Nontender.  MS: Adequate ROM spine, hips, shoulders and knees.  Ext: No edema.   CNS: CN 2-12 intact, power tone and sensation normal throughout.   Skin: Intact, no visible lesions or rashes.  Psych: Good eye contact, normal affect.  Memory intact, not anxious or depressed appearing.    Impression & Recommendations:  Problem # 1:  BACK PAIN (ICD-724.5) Assessment Unchanged  The following medications were removed from the medication list:     Ibuprofen 800 Mg Tabs (Ibuprofen) .Marland Kitchen... Take one tab three times a day as needed back pain Her updated medication list for this problem includes:    Aspirin 81 Mg Tbec (Aspirin) ..... One tab by mouth at bedtime    Tylenol Extra Strength 500 Mg Tabs (Acetaminophen) ..... One tablet daily as needed for pain  Problem # 2:  GERD (ICD-530.81) Assessment: Unchanged  Her updated medication list for this problem includes:    Omeprazole 20 Mg Cpdr (Omeprazole) .Marland KitchenMarland KitchenMarland KitchenMarland Kitchen 30 minutes prior to breakfast  Problem # 3:  DIABETES MELLITUS, TYPE II (ICD-250.00) Assessment: Unchanged  The following medications were removed from the medication list:    Metformin Hcl 500 Mg Tabs (Metformin hcl) .Marland Kitchen... 2 tablets in the morning and 1 in the evening Her updated medication list for this problem includes:    Aspirin 81 Mg Tbec (Aspirin) ..... One tab by mouth at bedtime    Metformin Hcl 500 Mg Tabs (Metformin hcl) .Marland Kitchen... Take 1 tablet by mouth two times a day  Orders: T- Hemoglobin A1C (11914-78295) EKG w/ Interpretation (93000)nSR, no ischemia  Labs Reviewed: Creat: 0.73 (11/19/2009)    Reviewed HgBA1c results: 6.6 (11/19/2009)  6.5 (07/18/2009)  Problem # 4:  HYPERLIPIDEMIA (ICD-272.4) Assessment: Unchanged  The following medications were removed from the medication list:    Simvastatin 40 Mg Tabs (Simvastatin) ..... One tab by mouth at bedtime Her updated medication list for this problem includes:    Pravastatin Sodium 40 Mg Tabs (Pravastatin sodium) .Marland Kitchen... Take 1 tab by mouth at bedtime  Orders: T-Lipid Profile [phone]) T-Hepatic Function [phone])  Labs Reviewed: SGOT: 16 (11/19/2009)   SGPT: 11 (11/19/2009)   HDL:39 (11/19/2009), 35 (02/22/2009)  LDL:71 (11/19/2009), 64 (02/22/2009)  Chol:126 (11/19/2009), 116 (02/22/2009)  Trig:78 (11/19/2009), 84 (02/22/2009)  Complete Medication List: 1)  Aspirin 81 Mg Tbec (Aspirin) .... One tab by mouth at bedtime 2)  Risperidone 2 Mg Tabs  (Risperidone) .... One tablet am and  two tablets in  the pm 3)  Lexapro 10 Mg Tabs (Escitalopram oxalate) .... One tab by mouth once daily 4)  Oyster Shell Calcium/d 500-200 Mg-unit Tabs (Calcium-vitamin d) .... Take 1 tablet by mouth three times a day 5)  Loratadine 10 Mg Tabs (Loratadine) .... Take one tablet by mouth once a day 6)  Benztropine Mesylate 1 Mg Tabs (Benztropine mesylate) .... Take 1 tablet by mouth two times a day 7)  Diabetic Shoes  .... W/ inserts x 1 pair dx: 250.00 8)  Clotrimazole-betamethasone 1-0.05 % Crea (Clotrimazole-betamethasone) .... Apply to the soles of both feet two times a day 9)  Omeprazole 20 Mg Cpdr (Omeprazole) .... 30 minutes prior to breakfast 10)  Docusate Sodium 100 Mg Caps (Docusate sodium) .... Take 1 tablet by mouth two times a day 11)  Pravastatin Sodium 40 Mg Tabs (Pravastatin sodium) .... Take 1 tab by mouth  at bedtime 12)  Metformin Hcl 500 Mg Tabs (Metformin hcl) .... Take 1 tablet by mouth two times a day 13)  Tylenol Extra Strength 500 Mg Tabs (Acetaminophen) .... One tablet daily as needed for pain  Other Orders: T-CBC w/Diff ([id number]) T-TSH [id number])  Patient Instructions: 1)  Please schedule a follow-up appointment in 4 months. 2)  Hepatic Panel prior to visit, ICD-9: 3)  Lipid Panel prior to visit, ICD-9: 4)  TSH prior to visit, ICD-9:   fasting in 4 months 5)  CBC w/ Diff prior to visit, ICD-9: 6)  HbgA1C prior to visit, ICD-9: 7)  Check your blood sugars regularlyOnce daily in the mornings before breakfast. If your readings are usually above 130 or below 70 you should contact our office. 8)  Stop ibuprofen, and simvastatin. 9)  Reduced dose of metformin. 10)  Labs were excellent Prescriptions: TYLENOL EXTRA STRENGTH 500 MG TABS (ACETAMINOPHEN) one tablet daily as needed for pain  #30 x 4   Entered and Authorized by:   [name] MD   Signed by:   [name] MD on 12/10/2009   Method used:   Printed then  faxed to ...       [facility] (retail)       [address]       [place], [place]  [address]       Ph: [phone]       Fax: [phone]   RxID:   [id number] METFORMIN HCL 500 MG TABS (METFORMIN HCL) Take 1 tablet by mouth two times a day  #60 x 4   Entered and Authorized by:   [name] MD   Signed by:   [name] MD on 12/10/2009   Method used:   Printed then faxed to ...       [facility] (retail)       [address]       [place], [place]  [address]       Ph: [phone]       Fax: [phone]   RxID:   [id number] PRAVASTATIN SODIUM 40 MG TABS (PRAVASTATIN SODIUM) Take 1 tab by mouth at bedtime  #30 x 4   Entered and Authorized by:   [name] MD   Signed by:   [name] MD on 12/10/2009   Method used:   Printed then faxed to ...       [facility] (retail)       [address]       [place], [place]  [address]       Ph: [phone]       Fax: [phone]   RxID:   [id number]    Orders Added: 1)  Est. Patient Level IV [62703] 2)  T-Lipid Profile [80061-22930] 3)  T-Hepatic Function [80076-22960] 4)  T-CBC w/Diff [50093-81829] 5)  T- Hemoglobin A1C [83036-23375] 6)  T-TSH [93716-96789] 7)  EKG w/ Interpretation [93000]

## 2010-02-20 NOTE — Assessment & Plan Note (Signed)
Summary: F UP   Vital Signs:  Patient profile:   55 year old female Menstrual status:  irregular Height:      62 inches Weight:      132.75 pounds BMI:     24.37 O2 Sat:      97 % Pulse rate:   106 / minute Pulse rhythm:   regular Resp:     16 per minute BP sitting:   98 / 68  (left arm) Cuff size:   regular  Vitals Entered By: [name] (July 16, 2009 1:37 PM) CC: Follow up chronic problems, still feels nauseated off and on throughout the day, mostly in the am and after meals   Primary Care Provider:  Lodema Hong, M.D.  CC:  Follow up chronic problems, still feels nauseated off and on throughout the day, and mostly in the am and after meals.  History of Present Illness: Reports  that she has been  doing well. Denies recent fever or chills. Denies sinus pressure, nasal congestion , ear pain or sore throat. Denies chest congestion, or cough productive of sputum. Denies chest pain, palpitations, PND, orthopnea or leg swelling. Denies abdominal pain,  diarrhea or constipation. Denies change in bowel movements or bloody stool. Denies dysuria , frequency, incontinence or hesitancy. Denies  joint pain, swelling, or reduced mobility. Denies headaches, vertigo, seizures. Denies depression, anxiety or insomnia. Denies  rash, lesions, or itch.     Current Medications (verified): 1)  Simvastatin 40 Mg  Tabs (Simvastatin) .... One Tab By Mouth At Bedtime 2)  Aspirin 81 Mg  Tbec (Aspirin) .... One Tab By Mouth At Bedtime 3)  Risperidone 2 Mg  Tabs (Risperidone) .... One Tablet Am and  Two Tablets in  The Pm 4)  Lexapro 10 Mg  Tabs (Escitalopram Oxalate) .... One Tab By Mouth Once Daily 5)  Oyster Shell Calcium/d 500-200 Mg-Unit Tabs (Calcium-Vitamin D) .... Take 1 Tablet By Mouth Three Times A Day 6)  Loratadine 10 Mg Tabs (Loratadine) .... Take One Tablet By Mouth Once A Day 7)  Benztropine Mesylate 1 Mg Tabs (Benztropine Mesylate) .... Take 1 Tablet By Mouth Two Times A Day 8)   Metformin Hcl 500 Mg Tabs (Metformin Hcl) .... 2 Tablets in The Morning and 1 in The Evening 9)  Diabetic Shoes .... W/ Inserts X 1 Pair Dx: 250.00 10)  Dulcolax 5 Mg Tbec (Bisacodyl) .... One Tablet Every 3 Days As Needed 11)  Clotrimazole-Betamethasone 1-0.05 % Crea (Clotrimazole-Betamethasone) .... Apply To The Soles of Both Feet Two Times A Day 12)  Omeprazole 20 Mg Cpdr (Omeprazole) .... 30 Minutes Prior To Breakfast 13)  Docusate Sodium 100 Mg Caps (Docusate Sodium) .... Take 1 Tablet By Mouth Two Times A Day  Allergies (verified): 1)  ! Risperdal (Risperidone) 2)  ! * Uncoated Asa 3)  ! * Haladol  Review of Systems      See HPI Eyes:  Denies discharge, eye pain, and red eye. GI:  Complains of nausea and vomiting; denies abdominal pain, constipation, diarrhea, and indigestion; vommits on avg twice per week. Psych:  Complains of mental problems; denies anxiety, depression, suicidal thoughts/plans, and thoughts of violence. Endo:  Denies cold intolerance, excessive hunger, excessive thirst, excessive urination, heat intolerance, polyuria, and weight change.  Physical Exam  General:  Well-developed,well-nourished,in no acute distress; alert,appropriate and cooperative throughout examination HEENT: No facial asymmetry,  EOMI, No sinus tenderness, TM's Clear, oropharynx  pink and moist.   Chest: Clear to auscultation bilaterally.  CVS: S1, S2, No murmurs, No S3.   Abd: Soft, Nontender.  MS: Adequate ROM spine, hips, shoulders and knees.  Ext: No edema.   CNS: CN 2-12 intact, power tone and sensation normal throughout.   Skin: Intact, no visible lesions or rashes.  Psych: Good eye contact, normal affect.  Memory intact, not anxious or depressed appearing.    Impression & Recommendations:  Problem # 1:  NAUSEA (ICD-787.02) Assessment Deteriorated  Orders: Zofran 1mg . injection (J4782) Admin of Therapeutic Inj  intramuscular or subcutaneous (95621)  Problem # 2:  GERD  (ICD-530.81) Assessment: Deteriorated  Her updated medication list for this problem includes:    Omeprazole 20 Mg Cpdr (Omeprazole) .Marland KitchenMarland KitchenMarland KitchenMarland Kitchen 30 minutes prior to breakfast  Problem # 3:  DIABETES MELLITUS, TYPE II (ICD-250.00) Assessment: Comment Only  Her updated medication list for this problem includes:    Aspirin 81 Mg Tbec (Aspirin) ..... One tab by mouth at bedtime    Metformin Hcl 500 Mg Tabs (Metformin hcl) .Marland Kitchen... 2 tablets in the morning and 1 in the evening  Orders: T- Hemoglobin A1C ([id number]) T-Urine Microalbumin w/creat. ratio [phone]) T- Hemoglobin A1C [phone])  Labs Reviewed: Creat: 0.84 (02/22/2009)    Reviewed HgBA1c results: 6.9 (04/15/2009)  6.8 (01/15/2009)  Problem # 4:  HYPERLIPIDEMIA (ICD-272.4) Assessment: Comment Only  Her updated medication list for this problem includes:    Simvastatin 40 Mg Tabs (Simvastatin) ..... One tab by mouth at bedtime  Orders: T-Hepatic Function [phone]) T-Lipid Profile [id number])  Labs Reviewed: SGOT: 16 (02/22/2009)   SGPT: 15 (02/22/2009)   HDL:35 (02/22/2009), 38 (11/22/2008)  LDL:64 (02/22/2009), 63 (11/22/2008)  Chol:116 (02/22/2009), 117 (11/22/2008)  Trig:84 (02/22/2009), 82 (11/22/2008)  Problem # 5:  SCHIZOPHRENIA (ICD-295.90) Assessment: Improved well controlled on meds, and pt is followed by psych  Complete Medication List: 1)  Simvastatin 40 Mg Tabs (Simvastatin) .... One tab by mouth at bedtime 2)  Aspirin 81 Mg Tbec (Aspirin) .... One tab by mouth at bedtime 3)  Risperidone 2 Mg Tabs (Risperidone) .... One tablet am and  two tablets in  the pm 4)  Lexapro 10 Mg Tabs (Escitalopram oxalate) .... One tab by mouth once daily 5)  Oyster Shell Calcium/d 500-200 Mg-unit Tabs (Calcium-vitamin d) .... Take 1 tablet by mouth three times a day 6)  Loratadine 10 Mg Tabs (Loratadine) .... Take one tablet by mouth once a day 7)  Benztropine Mesylate 1 Mg Tabs (Benztropine mesylate) .... Take 1  tablet by mouth two times a day 8)  Metformin Hcl 500 Mg Tabs (Metformin hcl) .... 2 tablets in the morning and 1 in the evening 9)  Diabetic Shoes  .... W/ inserts x 1 pair dx: 250.00 10)  Dulcolax 5 Mg Tbec (Bisacodyl) .... One tablet every 3 days as needed 11)  Clotrimazole-betamethasone 1-0.05 % Crea (Clotrimazole-betamethasone) .... Apply to the soles of both feet two times a day 12)  Omeprazole 20 Mg Cpdr (Omeprazole) .... 30 minutes prior to breakfast 13)  Docusate Sodium 100 Mg Caps (Docusate sodium) .... Take 1 tablet by mouth two times a day  Other Orders: T-Basic Metabolic Panel [phone])  Patient Instructions: 1)  Please schedule a follow-up appointment in 3.5 months. 2)  Check your blood sugars regularly. If your readings are usually above : or below 70 you should contact our office. 3)  HbgA1C prior to visit, ICD-9:  today, also send microalb from the office 4)  Fasting labs in 3.5 months chem 7 lipid , hepatic , HBa1C in  3.5 months  5)  You will get an injection in for nausea    Medication Administration  Injection # 1:    Medication: Zofran 1mg . injection    Diagnosis: NAUSEA (ICD-787.02)    Route: IM    Site: RUOQ gluteus    Exp Date: 10/12    Lot #: [id number]    Mfr: baxter    Comments: 4mg  given     Patient tolerated injection without complications    Given by: [name] (July 16, 2009 2:43 PM)  Orders Added: 1)  Est. Patient Level IV [78469] 2)  T- Hemoglobin A1C [83036-23375] 3)  T-Urine Microalbumin w/creat. ratio [[id number]] 4)  T-Basic Metabolic Panel [80048-22910] 5)  T-Hepatic Function [80076-22960] 6)  T-Lipid Profile [80061-22930] 7)  T- Hemoglobin A1C [83036-23375] 8)  Zofran 1mg . injection [J2405] 9)  Admin of Therapeutic Inj  intramuscular or subcutaneous [62952]

## 2010-02-20 NOTE — Medication Information (Signed)
Summary: Tax adviser   Imported By: Diana Eves 01/30/2009 10:21:13  _____________________________________________________________________  External Attachment:    Type:   Image     Comment:   External Document  Appended Document: RX Folder-omeprazole    Prescriptions: OMEPRAZOLE 20 MG CPDR (OMEPRAZOLE) 30 minutes prior to breakfast  #30 x 11   Entered and Authorized by:   Leanna Battles. Dixon Boos   Signed by:   Leanna Battles Alexandro Line PA-C on 01/31/2009   Method used:   Electronically to        [facility] (retail)       [address]       [place], [place]  [address]       Ph: [phone]       Fax: [phone]   RxID:   [id number]

## 2010-02-20 NOTE — Miscellaneous (Signed)
Summary: refill  Clinical Lists Changes  Medications: Rx of METFORMIN HCL 500 MG TABS (METFORMIN HCL) 2 tablets in the morning and 1 in the evening;  #90 x 3;  Signed;  Entered by: Worthy Keeler LPN;  Authorized by: [name] MD;  Method used: Telephoned to [facility] [address], [place], [place], [place]  [address], Ph: [phone], Fax: [phone] Rx of DOCUSATE SODIUM 100 MG Take 1 tablet by mouth two times a day;  #60 x 3;  Signed;  Entered by: Worthy Keeler LPN;  Authorized by: [name] MD;  Method used: Telephoned to [facility] [address], [place], [place], [place]  [address], Ph: [phone], Fax: [phone]    Prescriptions: DOCUSATE SODIUM 100 MG Take 1 tablet by mouth two times a day  #60 x 3   Entered by:   Worthy Keeler LPN   Authorized by:   [name] MD   Signed by:   Worthy Keeler LPN on 66/44/0347   Method used:   Telephoned to ...       [facility] (retail)       [address]       [place], [place]  [address]       Ph: [phone]       Fax: [phone]   RxID:   [id number] METFORMIN HCL 500 MG TABS (METFORMIN HCL) 2 tablets in the morning and 1 in the evening  #90 x 3   Entered by:   Worthy Keeler LPN   Authorized by:   [name] MD   Signed by:   Worthy Keeler LPN on 22/02/5425   Method used:   Telephoned to ...       [facility] (retail)       [address]       [place], [place]  [address]       Ph: [phone]       Fax: [phone]   RxID:   [id number]

## 2010-02-20 NOTE — Progress Notes (Signed)
Summary: BOIL UNDER GUM  Phone Note Call from Patient   Summary of Call: WANTS A REFERRALL  TO SEE DR. DUNCAN HAS A BOIL UNDER GUM CALL BACK AT [phone] Initial call taken by: Lind Guest,  April 29, 2009 4:23 PM  Follow-up for Phone Call        refer to the dentist of her choice pls Follow-up by: [name] MD,  April 29, 2009 5:06 PM  Additional Follow-up for Phone Call Additional follow up Details #1::        Meriam Sprague said pt already has appt with dr. Blondell Reveal.  Additional Follow-up by: Rudene Anda,  April 30, 2009 11:41 AM

## 2010-02-20 NOTE — Medication Information (Signed)
Summary: Tax adviser   Imported By: Lind Guest 12/11/2009 09:27:10  _____________________________________________________________________  External Attachment:    Type:   Image     Comment:   External Document

## 2010-02-20 NOTE — Medication Information (Signed)
Summary: OMEPRAZOLE 20MG   OMEPRAZOLE 20MG    Imported By: Rexene Alberts 01/28/2010 11:09:18  _____________________________________________________________________  External Attachment:    Type:   Image     Comment:   External Document  Appended Document: OMEPRAZOLE 20MG     Prescriptions: OMEPRAZOLE 20 MG CPDR (OMEPRAZOLE) 30 minutes prior to breakfast  #30 x 11   Entered and Authorized by:   Joselyn Arrow FNP-BC   Signed by:   Joselyn Arrow FNP-BC on 01/28/2010   Method used:   Electronically to        [facility] (retail)       [address]       [place], [place]  [address]       Ph: [phone]       Fax: [phone]   RxID:   [id number]

## 2010-02-20 NOTE — Miscellaneous (Signed)
Summary: Home Care Report  Home Care Report   Imported By: Lind Guest 02/12/2009 10:43:08  _____________________________________________________________________  External Attachment:    Type:   Image     Comment:   External Document

## 2010-02-20 NOTE — Assessment & Plan Note (Signed)
Summary: office visit   Vital Signs:  Patient profile:   55 year old female Menstrual status:  irregular Height:      62 inches Weight:      137.75 pounds BMI:     25.29 O2 Sat:      97 % Pulse rate:   80 / minute Pulse rhythm:   regular Resp:     16 per minute BP sitting:   118 / 80 Cuff size:   regular  Vitals Entered By: [name] (April 15, 2009 9:21 AM)  Nutrition Counseling: Patient's BMI is greater than 25 and therefore counseled on weight management options. CC: Follow up chronic problems Is Patient Diabetic? Yes   Primary Care Provider:  Lodema Hong, M.D.   CC:  Follow up chronic problems.  History of Present Illness: Reports  that she has been doing fairly well. Denies recent fever or chills. Denies sinus pressure, nasal congestion , ear pain or sore throat. Denies chest congestion, or cough productive of sputum. Denies chest pain, palpitations, PND, orthopnea or leg swelling. Denies abdominal pain, nausea, vomitting, diarrhea or constipation. Denies change in bowel movements or bloody stool. Denies dysuria , frequency, incontinence or hesitancy. Denies  joint pain, swelling, or reduced mobility. Denies headaches, vertigo, seizures. Denies depression, anxiety or insomnia. Denies  rash, lesions, or itch.     Current Medications (verified): 1)  Simvastatin 40 Mg  Tabs (Simvastatin) .... One Tab By Mouth At Bedtime 2)  Aspirin 81 Mg  Tbec (Aspirin) .... One Tab By Mouth At Bedtime 3)  Risperidone 2 Mg  Tabs (Risperidone) .... One Tablet Am and  Two Tablets in  The Pm 4)  Lexapro 10 Mg  Tabs (Escitalopram Oxalate) .... One Tab By Mouth Once Daily 5)  Oyster Shell Calcium/d 500-200 Mg-Unit Tabs (Calcium-Vitamin D) .... Take 1 Tablet By Mouth Three Times A Day 6)  Loratadine 10 Mg Tabs (Loratadine) .... Take One Tablet By Mouth Once A Day 7)  Benztropine Mesylate 1 Mg Tabs (Benztropine Mesylate) .... Take 1 Tablet By Mouth Two Times A Day 8)  Metformin Hcl  500 Mg Tabs (Metformin Hcl) .... 2 Tablets in The Morning and 1 in The Evening 9)  Diabetic Shoes .... W/ Inserts X 1 Pair Dx: 250.00 10)  Docusate Sodium 100 Mg .... Take 1 Tablet By Mouth Two Times A Day 11)  Dulcolax 5 Mg Tbec (Bisacodyl) .... One Tablet Every 3 Days As Needed 12)  Benefiber  Powd (Wheat Dextrin) .... 2 Tsp By Mouth Tid 13)  Clotrimazole-Betamethasone 1-0.05 % Crea (Clotrimazole-Betamethasone) .... Apply To The Soles of Both Feet Two Times A Day 14)  Omeprazole 20 Mg Cpdr (Omeprazole) .... 30 Minutes Prior To Breakfast  Allergies (verified): No Known Drug Allergies  Review of Systems      See HPI Eyes:  Complains of vision loss-both eyes; denies blurring and discharge; wears corrective lenses. GU:  Complains of discharge; puritic vaginal d/c . Endo:  Denies excessive thirst, excessive urination, and weight change; tests blood sugars infrequently. Heme:  Denies abnormal bruising and bleeding. Allergy:  Complains of seasonal allergies.  Physical Exam  General:  Well-developed,well-nourished,in no acute distress; alert,appropriate and cooperative throughout examination HEENT: No facial asymmetry,  EOMI, No sinus tenderness, TM's Clear, oropharynx  pink and moist.   Chest: Clear to auscultation bilaterally.  CVS: S1, S2, No murmurs, No S3.   Abd: Soft, Nontender.  MS: Adequate ROM spine, hips, shoulders and knees.  Ext: No edema.  CNS: CN 2-12 intact, power tone and sensation normal throughout.   Skin: Intact, no visible lesions or rashes.  Psych: Good eye contact, normal affect.  Memory intact, not anxious or depressed appearing.   Diabetes Management Exam:    Foot Exam (with socks and/or shoes not present):       Sensory-Monofilament:          Left foot: diminished          Right foot: diminished       Inspection:          Left foot: normal          Right foot: normal       Nails:          Left foot: thickened          Right foot:  thickened   Impression & Recommendations:  Problem # 1:  HYPERLIPIDEMIA (ICD-272.4) Assessment Unchanged  Her updated medication list for this problem includes:    Simvastatin 40 Mg Tabs (Simvastatin) ..... One tab by mouth at bedtime  Labs Reviewed: SGOT: 16 (02/22/2009)   SGPT: 15 (02/22/2009)   HDL:35 (02/22/2009), 38 (11/22/2008)  LDL:64 (02/22/2009), 63 (11/22/2008)  Chol:116 (02/22/2009), 117 (11/22/2008)  Trig:84 (02/22/2009), 82 (11/22/2008)  Problem # 2:  DIABETES MELLITUS, TYPE II (ICD-250.00) Assessment: Comment Only  Her updated medication list for this problem includes:    Aspirin 81 Mg Tbec (Aspirin) ..... One tab by mouth at bedtime    Metformin Hcl 500 Mg Tabs (Metformin hcl) .Marland Kitchen... 2 tablets in the morning and 1 in the evening  Orders: Glucose, (CBG) (82962) T- Hemoglobin A1C ([address])  Labs Reviewed: Creat: 0.84 (02/22/2009)    Reviewed HgBA1c results: 6.8 (01/15/2009)  6.5 (10/12/2008)  Problem # 3:  ALLERGIC RHINITIS CAUSE UNSPECIFIED (ICD-477.9) Assessment: Unchanged  Her updated medication list for this problem includes:    Loratadine 10 Mg Tabs (Loratadine) .Marland Kitchen... Take one tablet by mouth once a day  Problem # 4:  PERSONAL HISTORY OF SCHIZOPHRENIA (ICD-V11.0) Assessment: Unchanged mx per psych  Complete Medication List: 1)  Simvastatin 40 Mg Tabs (Simvastatin) .... One tab by mouth at bedtime 2)  Aspirin 81 Mg Tbec (Aspirin) .... One tab by mouth at bedtime 3)  Risperidone 2 Mg Tabs (Risperidone) .... One tablet am and  two tablets in  the pm 4)  Lexapro 10 Mg Tabs (Escitalopram oxalate) .... One tab by mouth once daily 5)  Oyster Shell Calcium/d 500-200 Mg-unit Tabs (Calcium-vitamin d) .... Take 1 tablet by mouth three times a day 6)  Loratadine 10 Mg Tabs (Loratadine) .... Take one tablet by mouth once a day 7)  Benztropine Mesylate 1 Mg Tabs (Benztropine mesylate) .... Take 1 tablet by mouth two times a day 8)  Metformin Hcl 500 Mg Tabs  (Metformin hcl) .... 2 tablets in the morning and 1 in the evening 9)  Diabetic Shoes  .... W/ inserts x 1 pair dx: 250.00 10)  Docusate Sodium 100 Mg  .... Take 1 tablet by mouth two times a day 11)  Dulcolax 5 Mg Tbec (Bisacodyl) .... One tablet every 3 days as needed 12)  Benefiber Powd (Wheat dextrin) .... 2 tsp by mouth tid 13)  Clotrimazole-betamethasone 1-0.05 % Crea (Clotrimazole-betamethasone) .... Apply to the soles of both feet two times a day 14)  Omeprazole 20 Mg Cpdr (Omeprazole) .... 30 minutes prior to breakfast 15)  Fluconazole 150 Mg Tabs (Fluconazole) .... Take 1 tablet by mouth once a day  Other  Orders: T-Vitamin D (25-Hydroxy) [phone])  Patient Instructions: 1)  Please schedule a follow-up appointment in 3 months. 2)  It is important that you exercise regularly at least 20 minutes 5 times a week. If you develop chest pain, have severe difficulty breathing, or feel very tired , stop exercising immediately and seek medical attention. 3)  You need to lose weight. Consider a lower calorie diet and regular exercise. goal is 5 pounds. 4)  HbgA1C prior to visit, ICD-9: and Vit D level tioday 5)  your cholesterol and liver are great and the kidneys, no med changes at this time. 6)  one tab is sent in for vag itch, call back if it persits , Ibelieve it isd a yeast infection. Prescriptions: FLUCONAZOLE 150 MG TABS (FLUCONAZOLE) Take 1 tablet by mouth once a day  #1 x 0   Entered and Authorized by:   [name] MD   Signed by:   [name] MD on 04/15/2009   Method used:   Electronically to        [facility] (retail)       [address]       [place], [place]  [address]       Ph: [phone]       Fax: [phone]   RxID:   [id number]   Laboratory Results   Blood Tests     Glucose (random): 103 mg/dL   (Normal Range: 18-841)

## 2010-02-20 NOTE — Assessment & Plan Note (Signed)
Summary: GERD   Visit Type:  Follow-up Visit Primary Care Provider:  Lodema Hong, M.D.  Chief Complaint:  follow up- doing ok.  History of Present Illness: Rare 1-2x/wkk feels soreness under her ribs. Sometimes after she eats. Bowels moving good. BM: once daily or every other day. Rare nausea. Vomiting: 2-3x/mo. Can't think of anything that brings it on. May be related to eating greasy foods. Intentionally lost 9 lbs. Heartburn is good. The capsule helps and does good.  Current Medications (verified): 1)  Simvastatin 40 Mg  Tabs (Simvastatin) .... One Tab By Mouth At Bedtime 2)  Aspirin 81 Mg  Tbec (Aspirin) .... One Tab By Mouth At Bedtime 3)  Risperidone 2 Mg  Tabs (Risperidone) .... One Tablet Am and  Two Tablets in  The Pm 4)  Lexapro 10 Mg  Tabs (Escitalopram Oxalate) .... One Tab By Mouth Once Daily 5)  Oyster Shell Calcium/d 500-200 Mg-Unit Tabs (Calcium-Vitamin D) .... Take 1 Tablet By Mouth Three Times A Day 6)  Loratadine 10 Mg Tabs (Loratadine) .... Take One Tablet By Mouth Once A Day 7)  Benztropine Mesylate 1 Mg Tabs (Benztropine Mesylate) .... Take 1 Tablet By Mouth Two Times A Day 8)  Metformin Hcl 500 Mg Tabs (Metformin Hcl) .... 2 Tablets in The Morning and 1 in The Evening 9)  Diabetic Shoes .... W/ Inserts X 1 Pair Dx: 250.00 10)  Dulcolax 5 Mg Tbec (Bisacodyl) .... One Tablet Every 3 Days As Needed 11)  Clotrimazole-Betamethasone 1-0.05 % Crea (Clotrimazole-Betamethasone) .... Apply To The Soles of Both Feet Two Times A Day 12)  Omeprazole 20 Mg Cpdr (Omeprazole) .... 30 Minutes Prior To Breakfast  Allergies (verified): 1)  ! Risperdal (Risperidone) 2)  ! * Uncoated Asa 3)  ! 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 Head:  Normocephalic and atraumatic. Lungs:  Clear throughout to auscultation. Heart:  Regular rate and rhythm; no murmurs. Abdomen:  Soft, nontender and nondistended.Normal bowel sounds.  Impression & Recommendations:  Problem # 1:  GERD (ICD-530.81) Assessment Improved  Continue OMP. OPV in 12 mos.  CC: PCP  Orders: Est. Patient Level II (91478)  Appended Document: GERD 66yr follow up reminder appt made- cdg

## 2010-02-20 NOTE — Miscellaneous (Signed)
Summary: Home Care Report  Home Care Report   Imported By: Lind Guest 04/15/2009 11:21:30  _____________________________________________________________________  External Attachment:    Type:   Image     Comment:   External Document

## 2010-02-20 NOTE — Progress Notes (Signed)
  Phone Note Other Incoming   Caller: dr simpson Summary of Call: verify her meds on fl2 withe pharmacy, put sticky note on form so i vcan sign off pls put in my chair when done Initial call taken by: [name] MD,  Jun 03, 2009 11:39 PM  Follow-up for Phone Call        done Follow-up by: [name],  Jun 05, 2009 8:15 AM    New/Updated Medications: DOCUSATE SODIUM 100 MG CAPS (DOCUSATE SODIUM) Take 1 tablet by mouth two times a day

## 2010-02-20 NOTE — Letter (Signed)
Summary: RX METER  RX METER   Imported By: Lind Guest 12/10/2009 11:20:23  _____________________________________________________________________  External Attachment:    Type:   Image     Comment:   External Document

## 2010-02-20 NOTE — Miscellaneous (Signed)
Summary: Home Care Report  Home Care Report   Imported By: Lind Guest 12/11/2009 09:23:45  _____________________________________________________________________  External Attachment:    Type:   Image     Comment:   External Document

## 2010-02-20 NOTE — Miscellaneous (Signed)
Summary: Home Care Report  Home Care Report   Imported By: Lind Guest 04/16/2009 14:31:37  _____________________________________________________________________  External Attachment:    Type:   Image     Comment:   External Document

## 2010-02-20 NOTE — Letter (Signed)
Summary: MEDICAL CLEARANCE  MEDICAL CLEARANCE   Imported By: Lind Guest 12/10/2009 11:21:24  _____________________________________________________________________  External Attachment:    Type:   Image     Comment:   External Document

## 2010-02-28 ENCOUNTER — Encounter: Payer: Self-pay | Admitting: Family Medicine

## 2010-03-06 NOTE — Letter (Signed)
Summary: diabetic supplies  diabetic supplies   Imported By: Lind Guest 02/28/2010 09:35:57  _____________________________________________________________________  External Attachment:    Type:   Image     Comment:   External Document

## 2010-04-15 ENCOUNTER — Other Ambulatory Visit: Payer: Self-pay | Admitting: Family Medicine

## 2010-04-15 ENCOUNTER — Encounter: Payer: Self-pay | Admitting: Family Medicine

## 2010-04-15 LAB — HEPATIC FUNCTION PANEL
Alkaline Phosphatase: 59 U/L (ref 39–117)
Total Bilirubin: 0.2 mg/dL — ABNORMAL LOW (ref 0.3–1.2)

## 2010-04-15 LAB — LIPID PANEL
LDL Cholesterol: 69 mg/dL (ref 0–99)
Triglycerides: 104 mg/dL (ref <150)

## 2010-04-15 LAB — CBC WITH DIFFERENTIAL/PLATELET
Basophils Absolute: 0 10*3/uL (ref 0.0–0.1)
Basophils Relative: 0 % (ref 0–1)
Eosinophils Relative: 1 % (ref 0–5)
HCT: 37.8 % (ref 36.0–46.0)
MCHC: 33.1 g/dL (ref 30.0–36.0)
Monocytes Absolute: 0.6 10*3/uL (ref 0.1–1.0)
Neutro Abs: 2.3 10*3/uL (ref 1.7–7.7)
RDW: 12.8 % (ref 11.5–15.5)

## 2010-04-15 LAB — HEMOGLOBIN A1C: Mean Plasma Glucose: 148 mg/dL — ABNORMAL HIGH (ref <117)

## 2010-04-16 ENCOUNTER — Encounter: Payer: Self-pay | Admitting: Family Medicine

## 2010-04-17 ENCOUNTER — Encounter: Payer: Self-pay | Admitting: Family Medicine

## 2010-04-17 ENCOUNTER — Other Ambulatory Visit (HOSPITAL_COMMUNITY)
Admission: RE | Admit: 2010-04-17 | Discharge: 2010-04-17 | Disposition: A | Discharge status: Home / Self Care | Payer: Medicaid Other | Source: Ambulatory Visit | Attending: Family Medicine | Admitting: Family Medicine

## 2010-04-17 ENCOUNTER — Ambulatory Visit (INDEPENDENT_AMBULATORY_CARE_PROVIDER_SITE_OTHER): Payer: Medicaid Other | Admitting: Family Medicine

## 2010-04-17 VITALS — BP 104/74 | HR 104 | Resp 16 | Ht 62.5 in | Wt 129.1 lb

## 2010-04-17 DIAGNOSIS — R5381 Other malaise: Secondary | ICD-10-CM

## 2010-04-17 DIAGNOSIS — E119 Type 2 diabetes mellitus without complications: Secondary | ICD-10-CM

## 2010-04-17 DIAGNOSIS — J309 Allergic rhinitis, unspecified: Secondary | ICD-10-CM

## 2010-04-17 DIAGNOSIS — Z0000 Encounter for general adult medical examination without abnormal findings: Secondary | ICD-10-CM

## 2010-04-17 DIAGNOSIS — Z1211 Encounter for screening for malignant neoplasm of colon: Secondary | ICD-10-CM

## 2010-04-17 DIAGNOSIS — R5383 Other fatigue: Secondary | ICD-10-CM

## 2010-04-17 DIAGNOSIS — E785 Hyperlipidemia, unspecified: Secondary | ICD-10-CM

## 2010-04-17 DIAGNOSIS — Z01419 Encounter for gynecological examination (general) (routine) without abnormal findings: Secondary | ICD-10-CM | POA: Insufficient documentation

## 2010-04-17 NOTE — Patient Instructions (Addendum)
F/u in 4 months.  No med changes at this time.  Recent labs are good.   Fasting lipid, hepatic, chem 7 and HBA1C. In 4 months

## 2010-04-20 ENCOUNTER — Encounter: Payer: Self-pay | Admitting: Family Medicine

## 2010-04-20 NOTE — Progress Notes (Signed)
Subjective:    Patient ID: Wendy Oneill, female    DOB: 1955/07/27, 55 y.o.   MRN: [id number]  Diabetes She presents for her follow-up diabetic visit. She has type 2 diabetes mellitus. No MedicAlert identification noted. Her disease course has been stable. Pertinent negatives for hypoglycemia include no confusion, dizziness, headaches, nervousness/anxiousness, seizures, speech difficulty or tremors. Pertinent negatives for diabetes include no blurred vision, no chest pain, no fatigue, no foot ulcerations, no polydipsia, no polyphagia, no visual change, no weakness and no weight loss. Symptoms are stable. Risk factors for coronary artery disease include diabetes mellitus and dyslipidemia. Current diabetic treatment includes oral agent (monotherapy). She is compliant with treatment all of the time. Her weight is stable. There is no change in her home blood glucose trend. Her breakfast blood glucose range is generally 90-110 mg/dl. She does not see a podiatrist.Eye exam is current.   The PT is here for her annual exam,  and re-evaluation of chronic medical conditions, medication management and review of recent lab and radiology data.  Preventive health is updated, specifically  Cancer screening, Osteoporosis screening and Immunization.   Questions or concerns regarding consultations or procedures which the PT has had in the interim are  addressed. The PT denies any adverse reactions to current medications since the last visit.  There are no new concerns.  There are no specific complaints       Review of Systems  Constitutional: Negative for fever, chills, weight loss, activity change, appetite change, fatigue and unexpected weight change.  HENT: Negative for hearing loss, ear pain, congestion, sore throat, rhinorrhea, sneezing, trouble swallowing, neck pain, neck stiffness, postnasal drip and sinus pressure.   Eyes: Negative for blurred vision, photophobia, pain, discharge, redness, itching and  visual disturbance.  Respiratory: Negative for cough, chest tightness, shortness of breath and wheezing.   Cardiovascular: Negative for chest pain, palpitations and leg swelling.  Gastrointestinal: Negative for nausea, vomiting, abdominal pain, diarrhea, constipation and blood in stool.  Genitourinary: Negative for dysuria, frequency, hematuria and flank pain.  Musculoskeletal: Negative for myalgias, back pain, joint swelling, arthralgias and gait problem.  Skin: Negative for rash and wound.  Neurological: Negative for dizziness, tremors, seizures, speech difficulty, weakness, numbness and headaches.  Hematological: Negative for polydipsia, polyphagia and adenopathy. Does not bruise/bleed easily.  Psychiatric/Behavioral: Negative for suicidal ideas, hallucinations, behavioral problems, confusion, sleep disturbance and decreased concentration. The patient is not nervous/anxious and is not hyperactive.        Objective:   Physical Exam  Nursing note and vitals reviewed. Constitutional: She appears well-developed and well-nourished. No distress.  HENT:  Head: Normocephalic and atraumatic.  Right Ear: External ear normal.  Left Ear: External ear normal.  Nose: Nose normal.  Mouth/Throat: Oropharynx is clear and moist. No oropharyngeal exudate.       Edentulous. Tympanic membranes clear. No sinus tenderness.  Eyes: Conjunctivae are normal. Pupils are equal, round, and reactive to light. Right eye exhibits no discharge. Left eye exhibits no discharge. No scleral icterus.       Fundoscopy; no hemorhage or exudates noted   Neck: Neck supple. No JVD present. No tracheal deviation present. No thyromegaly present.  Cardiovascular: Normal rate, regular rhythm, normal heart sounds and intact distal pulses.  Exam reveals no gallop and no friction rub.   No murmur heard. Pulmonary/Chest: Effort normal. No stridor. No respiratory distress. She has no wheezes. She has no rales. She exhibits no  tenderness.  Abdominal: Soft. Bowel sounds are normal. She  exhibits no distension and no mass. There is no tenderness. There is no rebound and no guarding.       Rectal:No mass,guaic neg stool  Genitourinary: Vagina normal and uterus normal. Guaiac negative stool. No vaginal discharge found.  Musculoskeletal: Normal range of motion. She exhibits no edema and no tenderness.  Lymphadenopathy:    She has no cervical adenopathy.  Neurological: She has normal reflexes. No cranial nerve deficit. She exhibits abnormal muscle tone. Coordination normal.  Skin: Skin is warm and dry. No rash noted. She is not diaphoretic. No erythema.  Psychiatric: She has a normal mood and affect. Her behavior is normal. Judgment and thought content normal.          Assessment & Plan:  1. Physical exam : within normal.pap sent.  2.Diabetes controlled base on recent lab data, no med changes at this time.  3.Hyperlipidemia controlled , continue current meds.Hyperlipidemia:Low fat diet discussed and encouraged.  Schizophrenia: controlled, pt to continue with psych.

## 2010-04-22 ENCOUNTER — Encounter: Payer: Self-pay | Admitting: *Deleted

## 2010-04-25 ENCOUNTER — Other Ambulatory Visit: Payer: Self-pay | Admitting: Family Medicine

## 2010-05-14 ENCOUNTER — Ambulatory Visit (INDEPENDENT_AMBULATORY_CARE_PROVIDER_SITE_OTHER): Payer: Medicaid Other | Admitting: Gastroenterology

## 2010-05-14 ENCOUNTER — Encounter: Payer: Self-pay | Admitting: Gastroenterology

## 2010-05-14 DIAGNOSIS — K219 Gastro-esophageal reflux disease without esophagitis: Secondary | ICD-10-CM

## 2010-05-14 DIAGNOSIS — K5900 Constipation, unspecified: Secondary | ICD-10-CM

## 2010-05-14 NOTE — Assessment & Plan Note (Signed)
CONTROLLED. Continue OMP. OPV in 2 years. Will refill meds until next visit.

## 2010-05-14 NOTE — Assessment & Plan Note (Signed)
RESOLVED: Continue fiber and water.

## 2010-05-14 NOTE — Progress Notes (Signed)
Reminder in epic for pt to have a 2 yr follow up

## 2010-05-14 NOTE — Progress Notes (Signed)
  Subjective:    Patient ID: Wendy Oneill, female    DOB: 1955/09/16, 55 y.o.   MRN: [id number]  HPI  No questions, or concerns. No problems swallowing. Heartburn: Sx 100% controlled with OMP. Lost 8 lbs since APR 2011. Bms: q2-3 days, no straining most of the time.   Past Medical History  Diagnosis Date  . Allergic rhinitis   . Personal history of schizophrenia   . Diabetes mellitus, type 1   . Hyperlipidemia   . Adenoma May 2010    Simple : TCS   . GERD (gastroesophageal reflux disease)       Review of Systems     Objective:   Physical Exam  Vitals reviewed. Constitutional: She appears well-developed and well-nourished.  HENT:  Head: Normocephalic and atraumatic.  Cardiovascular: Normal rate and regular rhythm.   Pulmonary/Chest: Effort normal and breath sounds normal.  Abdominal: Soft. Bowel sounds are normal. There is tenderness. There is no rebound and no guarding.       MILD TTP BUQ  Neurological: She is alert.          Assessment & Plan:

## 2010-05-15 NOTE — Progress Notes (Signed)
Cc to PCP 

## 2010-05-27 ENCOUNTER — Other Ambulatory Visit: Payer: Self-pay | Admitting: Family Medicine

## 2010-05-29 ENCOUNTER — Telehealth: Payer: Self-pay | Admitting: Family Medicine

## 2010-05-29 NOTE — Telephone Encounter (Signed)
Called and left message.

## 2010-05-30 ENCOUNTER — Other Ambulatory Visit: Payer: Self-pay

## 2010-05-30 MED ORDER — CALCIUM CARBONATE-VITAMIN D 500-200 MG-UNIT PO TABS: 1.0000 | ORAL_TABLET | Freq: Three times a day (TID) | ORAL | Status: DC

## 2010-05-30 NOTE — Telephone Encounter (Signed)
Refilled calcium

## 2010-06-03 NOTE — Op Note (Signed)
NAME:  Wendy Oneill, Wendy Oneill                 ACCOUNT NO.:  [id number]   MEDICAL RECORD NO.:  [id number]          PATIENT TYPE:  AMB   LOCATION:  DAY                           FACILITY:  [facility]   PHYSICIAN:  Kassie Mends, M.D.      DATE OF BIRTH:  [birth year]   DATE OF PROCEDURE:  06/12/2008  DATE OF DISCHARGE:                               OPERATIVE REPORT   PROCEDURE:  Colonoscopy with snare cautery and cold forceps polypectomy.   INDICATION FOR EXAM:  Ms. Pe is a 55 year old female who presented in  2006 with rectal bleeding and iron deficiency anemia.  Her colon prep  was inadequate so a complete exam could not be performed.  She presents  with a complaint of constipation and intermittent rectal bleeding.   FINDINGS:  1. Extremely redundant sigmoid colon.  2. A 6-mm sessile proximal transverse colon polyp removed via snare      cautery.  A 4-mm sessile descending colon polyp removed via cold      forceps.  3. Moderate amount of retained liquid with particulate matter which      needed to be aspirated.  The sigmoid colon was extremely redundant      and difficult to traverse.  It did not easily distend.  An adequate      view of the left colon was obtained.  4. Normal retroflexed view of the rectum.   DIAGNOSES:  1. Colon polyps.  2. Extremely redundant left colon.   RECOMMENDATIONS:  1. Screening colonoscopy in 10 years as she has a simple adenoma.  If      she develops iron deficiency anemia or more persistent rectal      bleeding, then could consider a flexible sigmoidoscopy after 5      years.  2. Follow up in 2 months with Dr. Cira Servant regarding her constipation.      She was given a handout on high-fiber diet, constipation, and      polyps.  3. She may restart her aspirin on June 19, 2008.  4. She should drink 6-8 cups of water daily and add Fibersure twice      daily.  5. No NSAIDs or anticoagulation until June 19, 2008.   MEDICATIONS:  1. Demerol 50 mg IV.  2. Versed 4 mg  IV.   PROCEDURE TECHNIQUE:  Physical exam was performed.  Informed consent was  obtained from the patient after explaining the benefits, risks, and  alternatives to the procedure.  The patient was connected to the monitor  and placed in the left lateral position.  Continuous oxygen was provided  by nasal cannula.  IV medicine administered through an indwelling  cannula.  After administration of sedation and rectal exam, the  patient's rectum was intubated, and the scope was advanced under direct  visualization to the cecum.  The scope was removed slowly by carefully  examining the color, texture, anatomy, and integrity of the mucosa on  the way out.  The patient was recovered in endoscopy and discharged home  in satisfactory condition.   PATH:  Simple adenoma. TSC  in 10 years.      Kassie Mends, M.D.  Electronically Signed     SM/MEDQ  D:  06/12/2008  T:  06/12/2008  Job:  [id number]   cc:   Milus Mallick. Lodema Hong, M.D.  Fax: [phone]

## 2010-06-06 NOTE — Consult Note (Signed)
Wendy Oneill, Wendy Oneill                 ACCOUNT NO.:  [id number]   MEDICAL RECORD NO.:  [id number]          PATIENT TYPE:  AMB   LOCATION:  DAY                           FACILITY:  [facility]   PHYSICIAN:  Jerolyn Shin C. Katrinka Blazing, M.D.   DATE OF BIRTH:  April 25, 1955   DATE OF CONSULTATION:  DATE OF DISCHARGE:                                   CONSULTATION   CONSULTING PHYSICIAN:  Dirk Dress. Katrinka Blazing, M.D.   REFERRING PHYSICIAN:  Dr. Lodema Hong.   A 55 year old female referred by Dr. Lodema Hong for EGD and colonoscopy.  The  patient has had recurrent abdominal pain with nausea without vomiting.  She  has had two episodes of rectal bleeding with bright red blood.  She has not  had black bowel movements.  Her weight has been stable, and her appetite has  been good.  She is scheduled for upper and lower endoscopy.   PAST MEDICAL HISTORY:  1.  Hypertension.  2.  Diabetes mellitus.  3.  Hyperlipidemia.  4.  Depression.  5.  Iron-deficiency anemia with a history of rectal bleeding.   MEDICATIONS:  1.  __________  21/7 mg daily.  2.  Paroxetine 10 mg daily.  3.  Mobic 7.5 mg daily.  4.  Zocor 40 mg at bedtime.  5.  Hydrochlorothiazide 25 mg daily.  6.  Avandia 4 mg twice daily.  7.  Aspirin 81 mg daily.  8.  Fexofenadine 180 mg daily.  9.  Metformin 1,000 mg twice daily.  10. Zyprexa 10 mg at bedtime.  11. Patanol solution one drop each eye twice daily.   ALLERGIES:  None.   SURGERY:  None.   FAMILY HISTORY:  Positive for hypertension, diabetes.   SOCIAL HISTORY:  She is single.  Disabled.  She does not drink, smoke, or  use drugs.   PHYSICAL EXAMINATION:  VITAL SIGNS:  Blood pressure 126/82, pulse 78,  respirations 20, weight 148 pounds.  HEENT:  Unremarkable.  NECK:  Supple.  No JVD, bruit, adenopathy, or thyromegaly.  CHEST:  Clear to auscultation.  HEART:  Regular rate and rhythm without murmur, gallop, or rub.  ABDOMEN:  Mild suprapubic and left lower quadrant tenderness.  EXTREMITIES:  No  cyanosis, clubbing, or edema.  NEUROLOGIC:  No focal motor, sensory, or cerebellar deficit.   IMPRESSION:  1.  Recurrent abdominal pain in the epigastrium and lower abdomen with      documented anemia.  2.  History of bright red rectal bleeding.  3.  Diabetes mellitus.  4.  Hypertension.  5.  Depression.   PLAN:  Esophagogastroduodenoscopy and total colonoscopy.       LCS/MEDQ  D:  07/16/2004  T:  07/17/2004  Job:  [id number]

## 2010-06-25 ENCOUNTER — Other Ambulatory Visit: Payer: Self-pay

## 2010-07-07 ENCOUNTER — Ambulatory Visit: Payer: Medicaid Other | Admitting: Family Medicine

## 2010-07-23 LAB — BASIC METABOLIC PANEL
CO2: 29 mEq/L (ref 19–32)
Calcium: 9.8 mg/dL (ref 8.4–10.5)
Potassium: 4.9 mEq/L (ref 3.5–5.3)
Sodium: 141 mEq/L (ref 135–145)

## 2010-07-23 LAB — LIPID PANEL
Cholesterol: 153 mg/dL (ref 0–200)
HDL: 41 mg/dL (ref >39)
Total CHOL/HDL Ratio: 3.7 Ratio
VLDL: 15 mg/dL (ref 0–40)

## 2010-07-23 LAB — HEPATIC FUNCTION PANEL
ALT: 12 U/L (ref 0–35)
AST: 19 U/L (ref 0–37)
Albumin: 3.9 g/dL (ref 3.5–5.2)

## 2010-07-23 LAB — HEMOGLOBIN A1C: Hgb A1c MFr Bld: 7.1 % — ABNORMAL HIGH (ref <5.7)

## 2010-08-14 ENCOUNTER — Encounter: Payer: Self-pay | Admitting: Family Medicine

## 2010-08-19 ENCOUNTER — Ambulatory Visit (HOSPITAL_COMMUNITY)
Admission: RE | Admit: 2010-08-19 | Discharge: 2010-08-19 | Disposition: A | Discharge status: Home / Self Care | Payer: Medicaid Other | Source: Ambulatory Visit | Attending: Family Medicine | Admitting: Family Medicine

## 2010-08-19 ENCOUNTER — Encounter: Payer: Self-pay | Admitting: Family Medicine

## 2010-08-19 ENCOUNTER — Ambulatory Visit (INDEPENDENT_AMBULATORY_CARE_PROVIDER_SITE_OTHER): Payer: Medicaid Other | Admitting: Family Medicine

## 2010-08-19 VITALS — BP 118/80 | HR 110 | Resp 16 | Ht 62.5 in | Wt 129.4 lb

## 2010-08-19 DIAGNOSIS — R059 Cough, unspecified: Secondary | ICD-10-CM | POA: Insufficient documentation

## 2010-08-19 DIAGNOSIS — R05 Cough: Secondary | ICD-10-CM | POA: Insufficient documentation

## 2010-08-19 DIAGNOSIS — J309 Allergic rhinitis, unspecified: Secondary | ICD-10-CM

## 2010-08-19 DIAGNOSIS — E785 Hyperlipidemia, unspecified: Secondary | ICD-10-CM

## 2010-08-19 DIAGNOSIS — I10 Essential (primary) hypertension: Secondary | ICD-10-CM | POA: Insufficient documentation

## 2010-08-19 DIAGNOSIS — F209 Schizophrenia, unspecified: Secondary | ICD-10-CM

## 2010-08-19 DIAGNOSIS — E119 Type 2 diabetes mellitus without complications: Secondary | ICD-10-CM

## 2010-08-19 DIAGNOSIS — Z23 Encounter for immunization: Secondary | ICD-10-CM

## 2010-08-19 MED ORDER — LOVASTATIN 20 MG PO TABS: 20.0000 mg | ORAL_TABLET | Freq: Every day | ORAL | Status: DC

## 2010-08-19 MED ORDER — METFORMIN HCL 500 MG PO TABS: 500.0000 mg | ORAL_TABLET | Freq: Three times a day (TID) | ORAL | Status: DC

## 2010-08-19 NOTE — Patient Instructions (Signed)
F/u in 3.5 months LABWORK  NEEDS TO BE DONE BETWEEN 3 TO 7 DAYS BEFORE YOUR NEXT SCEDULED  VISIT.  THIS WILL IMPROVE THE QUALITY OF YOUR CARE.   Fasting labs before next visit.  microalb and pneumonia vaccine today  Dose increase on the metformin to 3 times daily.  New med for your cholesterol since the pravasttin made you jittery. cXR today

## 2010-08-19 NOTE — Assessment & Plan Note (Signed)
Excellent profile reports med intolerance , makes her jittery, will change to lovastatin

## 2010-08-20 LAB — MICROALBUMIN / CREATININE URINE RATIO
Creatinine, Urine: 111.1 mg/dL
Microalb, Ur: 0.5 mg/dL (ref 0.00–1.89)

## 2010-08-28 ENCOUNTER — Other Ambulatory Visit: Payer: Self-pay | Admitting: Family Medicine

## 2010-09-09 NOTE — Assessment & Plan Note (Signed)
Followed by psych, currently stable 

## 2010-09-09 NOTE — Progress Notes (Signed)
  Subjective:    Patient ID: Wendy Oneill, female    DOB: 09/14/1955, 55 y.o.   MRN: [id number]  HPI The PT is here for follow up and re-evaluation of chronic medical conditions, medication management and review of any available recent lab and radiology data.  Preventive health is updated, specifically  Cancer screening and Immunization.   Questions or concerns regarding consultations or procedures which the PT has had in the interim are  addressed. The PT denies any adverse reactions to current medications since the last visit.  There are no new concerns.She does note recent increase in her anxiety  There are no specific complaints       Review of Systems Denies recent fever or chills. Denies sinus pressure, nasal congestion, ear pain or sore throat. Denies chest congestion, productive cough or wheezing. Denies chest pains, palpitations and leg swelling Denies abdominal pain, nausea, vomiting,diarrhea or constipation.   Denies dysuria, frequency, hesitancy or incontinence. Denies joint pain, swelling and limitation in mobility. Denies headaches, seizures, numbness, or tingling. Denies uncontrolled  depression, c/o mild inc anxiety. Denies suicidal or homicidal thoughts.denies hallucinations,  Denies skin break down or rash.        Objective:   Physical Exam Patient alert and oriented and in no cardiopulmonary distress.  HEENT: No facial asymmetry, EOMI, no sinus tenderness,  oropharynx pink and moist.  Neck supple no adenopathy.  Chest: Clear to auscultation bilaterally.  CVS: S1, S2 no murmurs, no S3.  ABD: Soft non tender. Bowel sounds normal.  Ext: No edema  MS: Adequate ROM spine, shoulders, hips and knees.  Skin: Intact, no ulcerations or rash noted.  Psych: Good eye contact,flat  affect. Memory loss not anxious or depressed appearing.  CNS: CN 2-12 intact, power, tone and sensation normal throughout.        Assessment & Plan:

## 2010-09-09 NOTE — Assessment & Plan Note (Signed)
Controlled, no change in medication  

## 2010-09-09 NOTE — Assessment & Plan Note (Signed)
Sub optimal control, low carb diet discussed  And encouraged

## 2010-09-30 ENCOUNTER — Other Ambulatory Visit: Payer: Self-pay | Admitting: Family Medicine

## 2010-10-02 ENCOUNTER — Telehealth: Payer: Self-pay | Admitting: Family Medicine

## 2010-10-02 ENCOUNTER — Other Ambulatory Visit: Payer: Self-pay

## 2010-10-02 MED ORDER — CALCIUM CARBONATE-VITAMIN D 500-200 MG-UNIT PO TABS: 1.0000 | ORAL_TABLET | Freq: Three times a day (TID) | ORAL | Status: DC

## 2010-10-02 NOTE — Telephone Encounter (Signed)
MED REFILLED  

## 2010-10-15 ENCOUNTER — Other Ambulatory Visit: Payer: Self-pay | Admitting: Family Medicine

## 2010-10-15 DIAGNOSIS — Z139 Encounter for screening, unspecified: Secondary | ICD-10-CM

## 2010-10-27 ENCOUNTER — Ambulatory Visit (HOSPITAL_COMMUNITY)
Admission: RE | Admit: 2010-10-27 | Discharge: 2010-10-27 | Disposition: A | Discharge status: Home / Self Care | Payer: Medicaid Other | Source: Ambulatory Visit | Attending: Family Medicine | Admitting: Family Medicine

## 2010-10-27 DIAGNOSIS — Z1231 Encounter for screening mammogram for malignant neoplasm of breast: Secondary | ICD-10-CM | POA: Insufficient documentation

## 2010-10-27 DIAGNOSIS — Z139 Encounter for screening, unspecified: Secondary | ICD-10-CM

## 2010-12-01 ENCOUNTER — Other Ambulatory Visit: Payer: Self-pay | Admitting: Family Medicine

## 2010-12-08 ENCOUNTER — Encounter: Payer: Self-pay | Admitting: Family Medicine

## 2010-12-15 ENCOUNTER — Ambulatory Visit (INDEPENDENT_AMBULATORY_CARE_PROVIDER_SITE_OTHER): Payer: Medicaid Other | Admitting: Family Medicine

## 2010-12-15 ENCOUNTER — Encounter: Payer: Self-pay | Admitting: Family Medicine

## 2010-12-15 ENCOUNTER — Telehealth: Payer: Self-pay | Admitting: Family Medicine

## 2010-12-15 VITALS — BP 126/64 | HR 88 | Resp 16 | Ht 62.5 in | Wt 128.0 lb

## 2010-12-15 DIAGNOSIS — E785 Hyperlipidemia, unspecified: Secondary | ICD-10-CM

## 2010-12-15 DIAGNOSIS — R5381 Other malaise: Secondary | ICD-10-CM

## 2010-12-15 DIAGNOSIS — J309 Allergic rhinitis, unspecified: Secondary | ICD-10-CM

## 2010-12-15 DIAGNOSIS — E119 Type 2 diabetes mellitus without complications: Secondary | ICD-10-CM

## 2010-12-15 DIAGNOSIS — F209 Schizophrenia, unspecified: Secondary | ICD-10-CM

## 2010-12-15 NOTE — Progress Notes (Signed)
  Subjective:    Patient ID: Wendy Oneill, female    DOB: Dec 03, 1955, 55 y.o.   MRN: [id number]  HPI The PT is here for follow up and re-evaluation of chronic medical conditions, medication management and review of any available recent lab and radiology data.  Preventive health is updated, specifically  Cancer screening and Immunization.   Questions or concerns regarding consultations or procedures which the PT has had in the interim are  addressed. The PT denies any adverse reactions to current medications since the last visit.  There are no new concerns.  There are no specific complaints . Blood sugars are tested daily, and fasting sugars are seldom over 110      Review of Systems    See HPI Denies recent fever or chills. Denies sinus pressure, nasal congestion, ear pain or sore throat. Denies chest congestion, productive cough or wheezing. Denies chest pains, palpitations and leg swelling Denies abdominal pain, nausea, vomiting,diarrhea or constipation.   Denies dysuria, frequency, hesitancy or incontinence. Denies joint pain, swelling and limitation in mobility. Denies headaches, seizures, numbness, or tingling. Denies uncontrolled  depression, anxiety or insomnia.Followed by psychiatry. Denies skin break down or rash.     Objective:   Physical Exam Patient alert and oriented and in no cardiopulmonary distress.  HEENT: No facial asymmetry, EOMI, no sinus tenderness,  oropharynx pink and moist.  Neck supple no adenopathy.  Chest: Clear to auscultation bilaterally.  CVS: S1, S2 no murmurs, no S3.  ABD: Soft non tender. Bowel sounds normal.  Ext: No edema  MS: Adequate ROM spine, shoulders, hips and knees.  Skin: Intact, no ulcerations or rash noted.  Psych: Good eye contact, normal affect. Memory intact not anxious or depressed appearing.  CNS: CN 2-12 intact, power, tone and sensation normal throughout.        Assessment & Plan:

## 2010-12-15 NOTE — Patient Instructions (Signed)
CPE first or 2nd week in April.  HBA1C and chem 7 today.   Fasting cbc, cmp and eGFR, lipid , hBA1C and TSH 3/28 or after.   No med changes at this time.  Happy holidays!

## 2010-12-16 LAB — BASIC METABOLIC PANEL
BUN: 9 mg/dL (ref 6–23)
Calcium: 10.2 mg/dL (ref 8.4–10.5)
Glucose, Bld: 80 mg/dL (ref 70–99)
Potassium: 4.5 mEq/L (ref 3.5–5.3)
Sodium: 141 mEq/L (ref 135–145)

## 2010-12-16 LAB — HEMOGLOBIN A1C
Hgb A1c MFr Bld: 6.7 % — ABNORMAL HIGH (ref <5.7)
Mean Plasma Glucose: 146 mg/dL — ABNORMAL HIGH (ref <117)

## 2010-12-16 NOTE — Telephone Encounter (Signed)
Picked up yesterday

## 2010-12-16 NOTE — Assessment & Plan Note (Signed)
Controlled, no change in medication No med change at this time

## 2010-12-16 NOTE — Assessment & Plan Note (Signed)
Stable and followed by psych 

## 2010-12-16 NOTE — Assessment & Plan Note (Signed)
Improved and controlled, no med change 

## 2010-12-16 NOTE — Assessment & Plan Note (Signed)
Controlled, no change in medication  

## 2011-01-02 ENCOUNTER — Other Ambulatory Visit: Payer: Self-pay | Admitting: Family Medicine

## 2011-01-29 ENCOUNTER — Other Ambulatory Visit: Payer: Self-pay | Admitting: Family Medicine

## 2011-04-29 ENCOUNTER — Ambulatory Visit (INDEPENDENT_AMBULATORY_CARE_PROVIDER_SITE_OTHER): Payer: Medicaid Other | Admitting: Family Medicine

## 2011-04-29 ENCOUNTER — Encounter: Payer: Self-pay | Admitting: Family Medicine

## 2011-04-29 ENCOUNTER — Other Ambulatory Visit (HOSPITAL_COMMUNITY)
Admission: RE | Admit: 2011-04-29 | Discharge: 2011-04-29 | Disposition: A | Discharge status: Home / Self Care | Payer: Medicaid Other | Source: Ambulatory Visit | Attending: Family Medicine | Admitting: Family Medicine

## 2011-04-29 VITALS — BP 112/74 | HR 110 | Resp 16 | Ht 62.5 in | Wt 126.4 lb

## 2011-04-29 DIAGNOSIS — Z1211 Encounter for screening for malignant neoplasm of colon: Secondary | ICD-10-CM

## 2011-04-29 DIAGNOSIS — N3090 Cystitis, unspecified without hematuria: Secondary | ICD-10-CM

## 2011-04-29 DIAGNOSIS — N3000 Acute cystitis without hematuria: Secondary | ICD-10-CM

## 2011-04-29 DIAGNOSIS — E785 Hyperlipidemia, unspecified: Secondary | ICD-10-CM

## 2011-04-29 DIAGNOSIS — Z0000 Encounter for general adult medical examination without abnormal findings: Secondary | ICD-10-CM

## 2011-04-29 DIAGNOSIS — Z124 Encounter for screening for malignant neoplasm of cervix: Secondary | ICD-10-CM

## 2011-04-29 DIAGNOSIS — Z01419 Encounter for gynecological examination (general) (routine) without abnormal findings: Secondary | ICD-10-CM | POA: Insufficient documentation

## 2011-04-29 DIAGNOSIS — N760 Acute vaginitis: Secondary | ICD-10-CM

## 2011-04-29 DIAGNOSIS — E119 Type 2 diabetes mellitus without complications: Secondary | ICD-10-CM

## 2011-04-29 LAB — CBC WITH DIFFERENTIAL/PLATELET
Basophils Absolute: 0 10*3/uL (ref 0.0–0.1)
Basophils Relative: 0 % (ref 0–1)
Eosinophils Absolute: 0.1 10*3/uL (ref 0.0–0.7)
Eosinophils Relative: 3 % (ref 0–5)
HCT: 38.3 % (ref 36.0–46.0)
Lymphocytes Relative: 40 % (ref 12–46)
MCH: 30.6 pg (ref 26.0–34.0)
MCHC: 32.1 g/dL (ref 30.0–36.0)
MCV: 95.3 fL (ref 78.0–100.0)
Monocytes Absolute: 0.7 10*3/uL (ref 0.1–1.0)
Platelets: 348 10*3/uL (ref 150–400)
RDW: 13.3 % (ref 11.5–15.5)
WBC: 4.5 10*3/uL (ref 4.0–10.5)

## 2011-04-29 LAB — POCT URINALYSIS DIPSTICK
Ketones, UA: NEGATIVE
Protein, UA: NEGATIVE
Urobilinogen, UA: 0.2
pH, UA: 6

## 2011-04-29 LAB — COMPLETE METABOLIC PANEL WITH GFR
ALT: 22 U/L (ref 0–35)
Albumin: 4 g/dL (ref 3.5–5.2)
CO2: 24 mEq/L (ref 19–32)
Calcium: 9.9 mg/dL (ref 8.4–10.5)
Chloride: 104 mEq/L (ref 96–112)
GFR, Est African American: >89 mL/min
GFR, Est Non African American: >89 mL/min
Glucose, Bld: 88 mg/dL (ref 70–99)
Sodium: 141 mEq/L (ref 135–145)
Total Bilirubin: 0.2 mg/dL — ABNORMAL LOW (ref 0.3–1.2)
Total Protein: 6.9 g/dL (ref 6.0–8.3)

## 2011-04-29 LAB — LIPID PANEL
HDL: 49 mg/dL (ref >39)
LDL Cholesterol: 63 mg/dL (ref 0–99)
Triglycerides: 118 mg/dL (ref <150)

## 2011-04-29 NOTE — Assessment & Plan Note (Signed)
Controlled, no change in medication  

## 2011-04-29 NOTE — Assessment & Plan Note (Signed)
Symptomatic, swabs sent for testing 

## 2011-04-29 NOTE — Assessment & Plan Note (Signed)
Symptomatic with abn CCUA , will wait on c/s to treat if needed

## 2011-04-29 NOTE — Progress Notes (Signed)
  Subjective:    Patient ID: Wendy Oneill, female    DOB: [birth year], 56 y.o.   MRN: [id number]  HPI The PT is here for annual exam and re-evaluation of chronic medical conditions, medication management and review of any available recent lab and radiology data.  Preventive health is updated, specifically  Cancer screening and Immunization.   Questions or concerns regarding consultations or procedures which the PT has had in the interim are  addressed. The PT denies any adverse reactions to current medications since the last visit.  C/o supra pubic pressure and mild frequency for the past 3 days. Denies flank pain. C/o pruritic vaginal d/c x 2 weeks C/o intermittent right temporal headache x 2 weeks, and feels at times as though blood sugar is "bottoming out" though never checks the sugar, happens on avg once every 2 weeks in the past month     Review of Systems See HPI Denies recent fever or chills. Denies sinus pressure, nasal congestion, ear pain or sore throat. Denies chest congestion, productive cough or wheezing. Denies chest pains, palpitations and leg swelling Denies abdominal pain, nausea, vomiting,diarrhea or constipation.   . Denies joint pain, swelling and limitation in mobility. Denies  seizures, numbness, or tingling. Denies uncontrolled  depression, anxiety or insomnia. Denies skin break down or rash.        Objective:   Physical Exam Pleasant well nourished female, alert and oriented x 3, in no cardio-pulmonary distress. Afebrile. HEENT No facial trauma or asymetry. Sinuses non tender.  EOMI, PERTL, fundoscopic exam  no hemorhage or exudate.  External ears normal, tympanic membranes clear. Oropharynx moist, no exudate, poor  dentition. Neck: supple, no adenopathy,JVD or thyromegaly.No bruits.  Chest: Clear to ascultation bilaterally.No crackles or wheezes. Non tender to palpation  Breast: No asymetry,no masses. No nipple discharge or inversion. No  axillary or supraclavicular adenopathy  Cardiovascular system; Heart sounds normal,  S1 and  S2 ,no S3.  No murmur, or thrill. Apical beat not displaced Peripheral pulses normal.  Abdomen: Soft, non tender, no organomegaly or masses. No bruits. Bowel sounds normal. No guarding, tenderness or rebound.  Rectal:  No mass. Guaiac negative stool.  GU: External genitalia normal. No lesions. Vaginal canal normal.white , thick discharge. Uterus normal size, no adnexal masses, no cervical motion or adnexal tenderness.  Musculoskeletal exam: Full ROM of spine, hips , shoulders and knees. No deformity ,swelling or crepitus noted. No muscle wasting or atrophy.   Neurologic: Cranial nerves 2 to 12 intact. Power, tone ,sensation and reflexes normal throughout. No disturbance in gait. No tremor.  Skin: Intact, no ulceration, erythema , scaling or rash noted. Pigmentation normal throughout  Psych; Normal mood and blunt  affect.   Diabetic Foot Check:  Appearance -  calluses Skin - no unusual pallor or redness Sensation - grossly intact to light touch Monofilament testing -  Right - Great toe, medial, central, lateral ball and posterior foot intact Left - Great toe, medial, central, lateral ball and posterior foot intact Pulses Left - Dorsalis Pedis and Posterior Tibia normal Right - Dorsalis Pedis and Posterior Tibia normal      Assessment & Plan:

## 2011-04-29 NOTE — Patient Instructions (Signed)
F/u in 4.5 month  Please call if you need me sooner  No changes in medication, labs are excellent.  Ensure you eat regularly and on a schedule, this will reduce headaches and the number of times you feel as though you are "bottoming out"  Urine specimen and swabs and pap are sent.  HBA1C in 4.5 month

## 2011-04-30 ENCOUNTER — Other Ambulatory Visit: Payer: Self-pay | Admitting: Family Medicine

## 2011-04-30 LAB — GC/CHLAMYDIA PROBE AMP, GENITAL: Chlamydia, DNA Probe: NEGATIVE

## 2011-04-30 LAB — WET PREP BY MOLECULAR PROBE
Gardnerella vaginalis: NEGATIVE
Trichomonas vaginosis: NEGATIVE

## 2011-04-30 MED ORDER — FLUCONAZOLE 150 MG PO TABS: ORAL_TABLET | ORAL | Status: AC

## 2011-05-01 LAB — URINE CULTURE

## 2011-05-26 ENCOUNTER — Other Ambulatory Visit: Payer: Self-pay | Admitting: Family Medicine

## 2011-06-29 ENCOUNTER — Other Ambulatory Visit: Payer: Self-pay | Admitting: Family Medicine

## 2011-07-15 ENCOUNTER — Telehealth: Payer: Self-pay | Admitting: Family Medicine

## 2011-07-15 ENCOUNTER — Other Ambulatory Visit: Payer: Self-pay | Admitting: Family Medicine

## 2011-07-15 MED ORDER — CLOTRIMAZOLE-BETAMETHASONE 1-0.05 % EX CREA: TOPICAL_CREAM | CUTANEOUS | Status: DC

## 2011-07-15 NOTE — Telephone Encounter (Signed)
Advise med sent in 1 tube no refill please

## 2011-07-15 NOTE — Telephone Encounter (Signed)
Can pt have cream sent for this?

## 2011-08-25 LAB — HEMOGLOBIN A1C: Mean Plasma Glucose: 148 mg/dL — ABNORMAL HIGH (ref <117)

## 2011-08-31 ENCOUNTER — Other Ambulatory Visit: Payer: Self-pay | Admitting: Family Medicine

## 2011-09-10 ENCOUNTER — Telehealth: Payer: Self-pay | Admitting: Family Medicine

## 2011-09-10 DIAGNOSIS — E119 Type 2 diabetes mellitus without complications: Secondary | ICD-10-CM

## 2011-09-10 NOTE — Telephone Encounter (Signed)
Referral placed.

## 2011-09-11 NOTE — Telephone Encounter (Signed)
Patient is aware 

## 2011-09-23 ENCOUNTER — Other Ambulatory Visit: Payer: Self-pay | Admitting: Family Medicine

## 2011-09-23 DIAGNOSIS — Z139 Encounter for screening, unspecified: Secondary | ICD-10-CM

## 2011-09-29 ENCOUNTER — Ambulatory Visit (INDEPENDENT_AMBULATORY_CARE_PROVIDER_SITE_OTHER): Payer: Medicaid Other | Admitting: Family Medicine

## 2011-09-29 VITALS — BP 110/70 | HR 70 | Resp 14 | Wt 124.0 lb

## 2011-09-29 DIAGNOSIS — Z23 Encounter for immunization: Secondary | ICD-10-CM

## 2011-09-29 DIAGNOSIS — E785 Hyperlipidemia, unspecified: Secondary | ICD-10-CM

## 2011-09-29 DIAGNOSIS — J309 Allergic rhinitis, unspecified: Secondary | ICD-10-CM

## 2011-09-29 DIAGNOSIS — E119 Type 2 diabetes mellitus without complications: Secondary | ICD-10-CM

## 2011-09-29 DIAGNOSIS — F209 Schizophrenia, unspecified: Secondary | ICD-10-CM

## 2011-09-29 NOTE — Patient Instructions (Addendum)
F/u in January.  Fasting lipid, cmp and EGFr, hBA1C,   Flu vaccine today.  Mammogram due in October please schedule and have this done , no medication changes  Microalb today

## 2011-10-01 LAB — MICROALBUMIN / CREATININE URINE RATIO
Creatinine, Urine: 48.2 mg/dL
Microalb, Ur: 0.5 mg/dL (ref 0.00–1.89)

## 2011-10-19 ENCOUNTER — Encounter: Payer: Self-pay | Admitting: Family Medicine

## 2011-10-19 NOTE — Assessment & Plan Note (Signed)
Controlled, no change in medication  

## 2011-10-19 NOTE — Assessment & Plan Note (Signed)
Controlled, no change in medication Patient advised to reduce carb and sweets, commit to regular physical activity, take meds as prescribed, test blood as directed, and attempt to lose weight, to improve blood sugar control.  

## 2011-10-19 NOTE — Assessment & Plan Note (Signed)
Hyperlipidemia:Low fat diet discussed and encouraged.  Updated labs prior to next visit 

## 2011-10-19 NOTE — Assessment & Plan Note (Signed)
Stable and controlled, meds and management through mental health

## 2011-10-19 NOTE — Progress Notes (Signed)
  Subjective:    Patient ID: Wendy Oneill, female    DOB: 1955/07/25, 56 y.o.   MRN: [id number]  HPI The PT is here for follow up and re-evaluation of chronic medical conditions, medication management and review of any available recent lab and radiology data.  Preventive health is updated, specifically  Cancer screening and Immunization.   Questions or concerns regarding consultations or procedures which the PT has had in the interim are  addressed. The PT denies any adverse reactions to current medications since the last visit.  There are no new concerns.  There are no specific complaints , denies polyuria, polydipsia, blurred vision or hypoglycemic episodes      Review of Systems See HPI Denies recent fever or chills. Denies sinus pressure, nasal congestion, ear pain or sore throat. Denies chest congestion, productive cough or wheezing. Denies chest pains, palpitations and leg swelling Denies abdominal pain, nausea, vomiting,diarrhea or constipation.   Denies dysuria, frequency, hesitancy or incontinence. Denies joint pain, swelling and limitation in mobility. Denies headaches, seizures, numbness, or tingling.  uncontrolled depression, anxiety or insomnia. Denies skin break down or rash.        Objective:   Physical Exam Patient alert and oriented and in no cardiopulmonary distress.  HEENT: No facial asymmetry, EOMI, no sinus tenderness,  oropharynx pink and moist.  Neck supple no adenopathy.  Chest: Clear to auscultation bilaterally.  CVS: S1, S2 no murmurs, no S3.  ABD: Soft non tender. Bowel sounds normal.  Ext: No edema  MS: Adequate ROM spine, shoulders, hips and knees.  Skin: Intact, no ulcerations or rash noted.  Psych: Good eye contact, normal affect. Memory intact not anxious or depressed appearing.  CNS: CN 2-12 intact, power, tone and sensation normal throughout. Diabetic Foot Check:  Appearance - no lesions, ulcers or calluses Skin - no unusual  pallor or redness Sensation - grossly intact to light touch Monofilament testing -  Right - Great toe, medial, central, lateral ball and posterior foot intact Left - Great toe, medial, central, lateral ball and posterior foot intact Pulses Left - Dorsalis Pedis and Posterior Tibia normal Right - Dorsalis Pedis and Posterior Tibia normal        Assessment & Plan:

## 2011-10-30 ENCOUNTER — Other Ambulatory Visit: Payer: Self-pay

## 2011-10-30 ENCOUNTER — Ambulatory Visit (HOSPITAL_COMMUNITY)
Admission: RE | Admit: 2011-10-30 | Discharge: 2011-10-30 | Disposition: A | Discharge status: Home / Self Care | Payer: Medicaid Other | Source: Ambulatory Visit | Attending: Family Medicine | Admitting: Family Medicine

## 2011-10-30 DIAGNOSIS — Z139 Encounter for screening, unspecified: Secondary | ICD-10-CM

## 2011-10-30 DIAGNOSIS — Z1231 Encounter for screening mammogram for malignant neoplasm of breast: Secondary | ICD-10-CM | POA: Insufficient documentation

## 2011-10-30 MED ORDER — CALCIUM CARBONATE-VITAMIN D 500-200 MG-UNIT PO TABS: 1.0000 | ORAL_TABLET | Freq: Three times a day (TID) | ORAL | Status: DC

## 2011-12-02 ENCOUNTER — Other Ambulatory Visit: Payer: Self-pay

## 2011-12-02 MED ORDER — BENZTROPINE MESYLATE 1 MG PO TABS: 1.0000 mg | ORAL_TABLET | Freq: Two times a day (BID) | ORAL | Status: DC

## 2011-12-02 MED ORDER — OMEPRAZOLE 20 MG PO TBEC: DELAYED_RELEASE_TABLET | ORAL | Status: DC

## 2011-12-02 MED ORDER — LORATADINE 10 MG PO TABS: ORAL_TABLET | ORAL | Status: DC

## 2011-12-02 MED ORDER — ASPIRIN 81 MG PO TBEC: DELAYED_RELEASE_TABLET | ORAL | Status: DC

## 2011-12-02 MED ORDER — LOVASTATIN 20 MG PO TABS: ORAL_TABLET | ORAL | Status: DC

## 2011-12-02 MED ORDER — METFORMIN HCL 500 MG PO TABS: ORAL_TABLET | ORAL | Status: DC

## 2011-12-22 ENCOUNTER — Other Ambulatory Visit: Payer: Self-pay

## 2012-01-07 ENCOUNTER — Ambulatory Visit: Payer: Medicaid Other | Admitting: Family Medicine

## 2012-01-08 ENCOUNTER — Encounter: Payer: Self-pay | Admitting: Family Medicine

## 2012-01-08 ENCOUNTER — Ambulatory Visit (INDEPENDENT_AMBULATORY_CARE_PROVIDER_SITE_OTHER): Payer: Medicaid Other | Admitting: Family Medicine

## 2012-01-08 VITALS — BP 98/60 | HR 80 | Temp 98.6°F | Resp 18 | Ht 62.5 in | Wt 126.1 lb

## 2012-01-08 DIAGNOSIS — J329 Chronic sinusitis, unspecified: Secondary | ICD-10-CM

## 2012-01-08 DIAGNOSIS — J019 Acute sinusitis, unspecified: Secondary | ICD-10-CM

## 2012-01-08 MED ORDER — PENICILLIN V POTASSIUM 500 MG PO TABS: 500.0000 mg | ORAL_TABLET | Freq: Three times a day (TID) | ORAL | Status: DC

## 2012-01-08 MED ORDER — SALINE NASAL SPRAY 0.65 % NA SOLN: 1.0000 | NASAL | Status: DC | PRN

## 2012-01-08 MED ORDER — CEFTRIAXONE SODIUM 1 G IJ SOLR
500.0000 mg | Freq: Once | INTRAMUSCULAR | Status: AC
  Administered 2012-01-08: 500 mg via INTRAMUSCULAR

## 2012-01-08 NOTE — Progress Notes (Signed)
  Subjective:    Patient ID: Wendy Oneill, female    DOB: Feb 06, 1955, 56 y.o.   MRN: [id number]  HPI  Patient presents with sinus pressure and drainage minimal cough sore throat and body aches for the past 3 weeks. She states it began to feel sick with body aches and cold symptoms the first week of December then she improved however her symptoms returned back last week associated with headache and dizziness when she got up.  Review of Systems   GEN- denies fatigue, fever, weight loss,weakness, recent illness HEENT- denies eye drainage, change in vision, nasal discharge, CVS- denies chest pain, palpitations RESP- denies SOB, cough, wheeze ABD- denies N/V, change in stools, abd pain GU- denies dysuria, hematuria, dribbling, incontinence MSK- denies joint pain, muscle aches, injury Neuro- denies headache, dizziness, syncope, seizure activity      Objective:   Physical Exam GEN- NAD, alert and oriented x3 HEENT- PERRL, EOMI, non injected sclera, pink conjunctiva, MMM, oropharynx mild injection, TM clear bilat + clear effusion, + maxillary sinus tenderness, inflammed turbinates, No Nasal drainage  Neck- Supple, no LAD CVS- RRR, no murmur RESP-CTAB EXT- No edema Pulses- Radial 2+ Neuro- No focal deficits, CNII-XII in tact        Assessment & Plan:

## 2012-01-08 NOTE — Patient Instructions (Addendum)
Antibiotic shot given Start pills tomorrow Use nasal saline in sinuses to help thin mucous Drink plenty of fluids Keep f/u appt with Dr. Lodema Hong

## 2012-01-09 DIAGNOSIS — J019 Acute sinusitis, unspecified: Secondary | ICD-10-CM | POA: Insufficient documentation

## 2012-01-09 NOTE — Assessment & Plan Note (Addendum)
Treat based on second illness Given Rocephin in office, lives in home, antibiotics pills should be available tomorrow Antibiotics and nasal spray

## 2012-01-21 LAB — HEMOGLOBIN A1C: Hgb A1c MFr Bld: 6.8 % — ABNORMAL HIGH (ref <5.7)

## 2012-01-22 LAB — COMPLETE METABOLIC PANEL WITH GFR
ALT: 17 U/L (ref 0–35)
CO2: 31 mEq/L (ref 19–32)
Calcium: 10.2 mg/dL (ref 8.4–10.5)
Chloride: 105 mEq/L (ref 96–112)
Creat: 0.77 mg/dL (ref 0.50–1.10)
GFR, Est African American: >89 mL/min
GFR, Est Non African American: 88 mL/min
Glucose, Bld: 107 mg/dL — ABNORMAL HIGH (ref 70–99)
Sodium: 143 mEq/L (ref 135–145)
Total Protein: 7.6 g/dL (ref 6.0–8.3)

## 2012-01-22 LAB — LIPID PANEL: LDL Cholesterol: 93 mg/dL (ref 0–99)

## 2012-01-28 ENCOUNTER — Other Ambulatory Visit: Payer: Self-pay

## 2012-01-28 MED ORDER — CALCIUM CARBONATE-VITAMIN D 500-200 MG-UNIT PO TABS: 1.0000 | ORAL_TABLET | Freq: Three times a day (TID) | ORAL | Status: DC

## 2012-01-28 MED ORDER — DOCUSATE SODIUM 100 MG PO CAPS: ORAL_CAPSULE | ORAL | Status: DC

## 2012-01-29 ENCOUNTER — Ambulatory Visit (INDEPENDENT_AMBULATORY_CARE_PROVIDER_SITE_OTHER): Payer: Medicaid Other | Admitting: Family Medicine

## 2012-01-29 ENCOUNTER — Encounter: Payer: Self-pay | Admitting: Family Medicine

## 2012-01-29 VITALS — BP 110/70 | HR 96 | Resp 18 | Ht 62.5 in | Wt 128.0 lb

## 2012-01-29 DIAGNOSIS — R5381 Other malaise: Secondary | ICD-10-CM

## 2012-01-29 DIAGNOSIS — F209 Schizophrenia, unspecified: Secondary | ICD-10-CM

## 2012-01-29 DIAGNOSIS — J309 Allergic rhinitis, unspecified: Secondary | ICD-10-CM

## 2012-01-29 DIAGNOSIS — E119 Type 2 diabetes mellitus without complications: Secondary | ICD-10-CM

## 2012-01-29 DIAGNOSIS — E785 Hyperlipidemia, unspecified: Secondary | ICD-10-CM

## 2012-01-29 LAB — HEMOGLOBIN A1C
Hgb A1c MFr Bld: 6.7 % — ABNORMAL HIGH (ref <5.7)
Mean Plasma Glucose: 146 mg/dL — ABNORMAL HIGH (ref <117)

## 2012-01-29 NOTE — Progress Notes (Signed)
  Subjective:    Patient ID: Lenell Antu, female    DOB: 1955-12-04, 57 y.o.   MRN: [id number]  HPI The PT is here for follow up and re-evaluation of chronic medical conditions, medication management and review of any available recent lab and radiology data.  Preventive health is updated, specifically  Cancer screening and Immunization.   Treated 3 weeks ago for sinus infection and reports symptom resolution. States ears "itch" from time to time Denies polyuria, polydipsia or blurred vision. The PT denies any adverse reactions to current medications since the last visit.  There are no new concerns.  There are no specific complaints       Review of Systems See HPI Denies recent fever or chills. Denies sinus pressure, nasal congestion, ear pain or sore throat. Denies chest congestion, productive cough or wheezing. Denies chest pains, palpitations and leg swelling Denies abdominal pain, nausea, vomiting,diarrhea or constipation.   Denies dysuria, frequency, hesitancy or incontinence. Denies joint pain, swelling and limitation in mobility. Denies headaches, seizures, numbness, or tingling. Denies depression, anxiety or insomnia. Denies skin break down or rash.        Objective:   Physical Exam Patient alert and oriented and in no cardiopulmonary distress.  HEENT: No facial asymmetry, EOMI, no sinus tenderness,  oropharynx pink and moist.  Neck supple no adenopathy.  Chest: Clear to auscultation bilaterally.  CVS: S1, S2 no murmurs, no S3.  ABD: Soft non tender. Bowel sounds normal.  Ext: No edema  MS: Adequate ROM spine, shoulders, hips and knees.  Skin: Intact, no ulcerations or rash noted.  Psych: Good eye contact, blunted affect.  not anxious or depressed appearing.  CNS: CN 2-12 intact, power, tone and sensation normal throughout.        Assessment & Plan:

## 2012-01-29 NOTE — Patient Instructions (Addendum)
F/u in 4.5 month.Please call if you need me before.  You need to see podiatry for toenails to be cut and I will put in a referral for  2 to 3 weeks  HBA1C today  Fasting lipid, cmp and EGFr, HBa1C and TSH in 4.5 month  Your sinus infection is cured

## 2012-01-30 NOTE — Assessment & Plan Note (Signed)
Controlled, no change in medication Hyperlipidemia:Low fat diet discussed and encouraged.  Updated labs at next visit

## 2012-01-30 NOTE — Assessment & Plan Note (Signed)
Controlled, no change in medication Patient advised to reduce carb and sweets, commit to regular physical activity, take meds as prescribed, test blood as directed, and attempt to lose weight, to improve blood sugar control.  

## 2012-01-30 NOTE — Assessment & Plan Note (Signed)
Stable and controlled, treatd by mental health

## 2012-01-30 NOTE — Assessment & Plan Note (Signed)
Controlled, no change in medication  

## 2012-02-26 ENCOUNTER — Other Ambulatory Visit: Payer: Self-pay | Admitting: Family Medicine

## 2012-03-23 LAB — TSH: TSH: 0.604 u[IU]/mL (ref 0.350–4.500)

## 2012-03-23 LAB — COMPLETE METABOLIC PANEL WITH GFR
ALT: 14 U/L (ref 0–35)
AST: 19 U/L (ref 0–37)
Alkaline Phosphatase: 52 U/L (ref 39–117)
BUN: 8 mg/dL (ref 6–23)
Creat: 0.87 mg/dL (ref 0.50–1.10)

## 2012-03-23 LAB — LIPID PANEL
Cholesterol: 132 mg/dL (ref 0–200)
Triglycerides: 82 mg/dL (ref <150)
VLDL: 16 mg/dL (ref 0–40)

## 2012-03-23 LAB — HEMOGLOBIN A1C
Hgb A1c MFr Bld: 6.5 % — ABNORMAL HIGH (ref <5.7)
Mean Plasma Glucose: 140 mg/dL — ABNORMAL HIGH (ref <117)

## 2012-04-26 ENCOUNTER — Other Ambulatory Visit: Payer: Self-pay | Admitting: Family Medicine

## 2012-05-02 ENCOUNTER — Encounter: Payer: Self-pay | Admitting: Gastroenterology

## 2012-06-23 ENCOUNTER — Encounter: Payer: Self-pay | Admitting: Family Medicine

## 2012-06-23 ENCOUNTER — Ambulatory Visit (INDEPENDENT_AMBULATORY_CARE_PROVIDER_SITE_OTHER): Payer: Medicaid Other | Admitting: Family Medicine

## 2012-06-23 ENCOUNTER — Telehealth: Payer: Self-pay | Admitting: Family Medicine

## 2012-06-23 VITALS — BP 126/78 | HR 100 | Resp 18 | Ht 62.5 in | Wt 128.0 lb

## 2012-06-23 DIAGNOSIS — F209 Schizophrenia, unspecified: Secondary | ICD-10-CM

## 2012-06-23 DIAGNOSIS — K219 Gastro-esophageal reflux disease without esophagitis: Secondary | ICD-10-CM

## 2012-06-23 DIAGNOSIS — J309 Allergic rhinitis, unspecified: Secondary | ICD-10-CM

## 2012-06-23 DIAGNOSIS — E785 Hyperlipidemia, unspecified: Secondary | ICD-10-CM

## 2012-06-23 DIAGNOSIS — E119 Type 2 diabetes mellitus without complications: Secondary | ICD-10-CM

## 2012-06-23 LAB — HEMOGLOBIN A1C
Hgb A1c MFr Bld: 6.9 % — ABNORMAL HIGH (ref <5.7)
Mean Plasma Glucose: 151 mg/dL — ABNORMAL HIGH (ref <117)

## 2012-06-23 NOTE — Progress Notes (Signed)
  Subjective:    Patient ID: Wendy Oneill, female    DOB: 05-18-1955, 57 y.o.   MRN: [id number]  HPI  The PT is here for follow up and re-evaluation of chronic medical conditions, medication management and review of any available recent lab and radiology data.  Preventive health is updated, specifically  Cancer screening and Immunization.   Questions or concerns regarding consultations or procedures which the PT has had in the interim are  addressed. The PT denies any adverse reactions to current medications since the last visit.  There are no new concerns.  There are no specific complaints      Review of Systems See HPI Denies recent fever or chills. Denies sinus pressure, nasal congestion, ear pain or sore throat. Denies chest congestion, productive cough or wheezing. Denies chest pains, palpitations and leg swelling Denies abdominal pain, nausea, vomiting,diarrhea or constipation.   Denies dysuria, frequency, hesitancy or incontinence. Denies joint pain, swelling and limitation in mobility. Denies headaches, seizures, numbness, or tingling. Denies depression, anxiety or insomnia. Denies skin break down or rash.        Objective:   Physical Exam Patient alert and oriented and in no cardiopulmonary distress.  HEENT: No facial asymmetry, EOMI, no sinus tenderness,  oropharynx pink and moist.  Neck supple no adenopathy.  Chest: Clear to auscultation bilaterally.  CVS: S1, S2 no murmurs, no S3.  ABD: Soft non tender. Bowel sounds normal.  Ext: No edema  MS: Adequate ROM spine, shoulders, hips and knees.  Skin: Intact, no ulcerations or rash noted.  Psych: Good eye contact, normal affect. Memory intact not anxious or depressed appearing.  CNS: CN 2-12 intact, power, tone and sensation normal throughout.        Assessment & Plan:

## 2012-06-23 NOTE — Patient Instructions (Addendum)
Pelvic and breast exam in end October, call if you need me before  HBA1C today  Fasting lipid, cmp and eGFR, hBA1C end October, before next viit   No med changes

## 2012-06-26 NOTE — Assessment & Plan Note (Signed)
Stable and controlled Followed by psych

## 2012-06-26 NOTE — Assessment & Plan Note (Signed)
Controlled, no change in medication  

## 2012-06-26 NOTE — Assessment & Plan Note (Signed)
Controlled, no change in medication Hyperlipidemia:Low fat diet discussed and encouraged.  \ 

## 2012-06-26 NOTE — Assessment & Plan Note (Signed)
Controlled, no change in medication Patient advised to reduce carb and sweets, commit to regular physical activity, take meds as prescribed, test blood as directed, and attempt to lose weight, to improve blood sugar control.  

## 2012-06-28 NOTE — Telephone Encounter (Signed)
Patient is aware 

## 2012-07-20 ENCOUNTER — Ambulatory Visit: Payer: Medicaid Other | Admitting: Gastroenterology

## 2012-07-27 ENCOUNTER — Other Ambulatory Visit: Payer: Self-pay | Admitting: Family Medicine

## 2012-07-28 ENCOUNTER — Ambulatory Visit (INDEPENDENT_AMBULATORY_CARE_PROVIDER_SITE_OTHER): Payer: Medicaid Other | Admitting: Gastroenterology

## 2012-07-28 ENCOUNTER — Encounter: Payer: Self-pay | Admitting: Gastroenterology

## 2012-07-28 ENCOUNTER — Other Ambulatory Visit: Payer: Self-pay

## 2012-07-28 VITALS — BP 108/66 | HR 92 | Temp 97.4°F | Ht 62.0 in | Wt 132.0 lb

## 2012-07-28 DIAGNOSIS — K219 Gastro-esophageal reflux disease without esophagitis: Secondary | ICD-10-CM

## 2012-07-28 DIAGNOSIS — K5900 Constipation, unspecified: Secondary | ICD-10-CM

## 2012-07-28 MED ORDER — BENEFIBER PO POWD: ORAL | Status: DC

## 2012-07-28 MED ORDER — OMEPRAZOLE 20 MG PO CPDR: DELAYED_RELEASE_CAPSULE | ORAL | Status: DC

## 2012-07-28 NOTE — Progress Notes (Signed)
  Subjective:    Patient ID: Wendy Oneill, female    DOB: [birth year], 57 y.o.   MRN: [id number]  Wendy Overman, MD  HPI LAST SEEN 2012. NOT ON PPI. MOST LIKELY FOR > 1 YEAR. FEELS HEARTBURN 2-3 DAYS AGO. HAS SX TWICE A WEEK. MAY HAVE PROBLEMS WITH LIQUIDS. NAUSEA: 3-4 TIMES/WEEK. CAN BE SEVERE. BMS: Q1-3 DAYS. MAY HAVE SHARP PAINS WHEN SHE GOES FROM SITTING TO STANDING: 2-3 TIMES/WEEK, < 1 MIN. GAINED 4 LBS SINCE LAST VISIT.  PT DENIES FEVER, CHILLS, BRBPR, vomiting, melena, diarrhea.   Past Medical History  Diagnosis Date  . Allergic rhinitis   . Personal history of schizophrenia   . Diabetes mellitus, type 1   . Hyperlipidemia   . Adenoma May 2010    Simple : TCS   . GERD (gastroesophageal reflux disease)     Past Surgical History  Procedure Laterality Date  . Multiple tooth extractions  Feb 03, 2010  . Colonoscopy  MAY 2010    SIMPLE ADENOMAS   Allergies  Allergen Reactions  . Risperidone   . Sulfonamide Derivatives     Current Outpatient Prescriptions  Medication Sig Dispense Refill  . ASPIRIN LOW DOSE 81 MG EC tablet TAKE (1) TABLET BY MOUTH EACH MORNING.    . COLACE 100 MG TID    . calcium-vitamin D (OYSTER CALCIUM 500 + D) 500-200 MG-UNIT per tablet Take 1 tablet by mouth 3 (three) times daily.    . COGENTIN 0.5 MG tablet TAKE (1) TABLET BY MOUTH TWICE DAILY.    Marland Kitchen escitalopram (LEXAPRO) 10 MG tablet TAKE (1) TABLET BY MOUTH EACH MORNING.    Marland Kitchen loratadine (CLARITIN) 10 MG tablet TAKE 1 TABLET BY MOUTH ONCE DAILY.    Marland Kitchen lovastatin (MEVACOR) 20 MG tablet TAKE 1 TABLET BY MOUTH ONCE DAILY.    . metFORMIN (GLUCOPHAGE) 500 MG tablet TAKE 1 TABLET BY MOUTH 3 TIMES DAILY WITH MEALS.    .      . Q-PAP 500 MG tablet TAKE 1 TABLET BY MOUTH ONCE DAILY AS NEEDED FOR PAIN.    Marland Kitchen risperiDONE (RISPERDAL) 2 MG tablet Take 2 mg by mouth 2 (two) times daily. Take one tablet by mouth in the AM and two tablets in the PM     . sodium chloride (OCEAN NASAL SPRAY) 0.65 % nasal spray  Place 1 spray into the nose as needed for congestion.    . STOOL SOFTENER 100 MG capsule TAKE (1) CAPSULE BY MOUTH TWICE DAILY.    . clotrimazole-betamethasone (LOTRISONE) cream Apply twice daily, as needed to affected area        Review of Systems     Objective:   Physical Exam  Vitals reviewed. Constitutional: She is oriented to person, place, and time. She appears well-nourished. No distress.  HENT:  Head: Normocephalic and atraumatic.  Mouth/Throat: Oropharynx is clear and moist. No oropharyngeal exudate.  Eyes: Pupils are equal, round, and reactive to light. No scleral icterus.  Cardiovascular: Normal rate, regular rhythm and normal heart sounds.   Pulmonary/Chest: Effort normal and breath sounds normal. No respiratory distress.  Abdominal: Soft. Bowel sounds are normal. She exhibits no distension. There is no tenderness.  Musculoskeletal: She exhibits no edema.  Neurological: She is alert and oriented to person, place, and time.  NO  NEW FOCAL DEFICITS   Psychiatric:  FLAT AFFECT, NL MOOD           Assessment & Plan:

## 2012-07-28 NOTE — Progress Notes (Signed)
Cc PCP 

## 2012-07-28 NOTE — Patient Instructions (Addendum)
Use Prilosec 30 minutes prior to your first meal.  USE FIBER POWDER  ONCE DAILY FOR 3 DAYS THEN TWICE DAILY FOR 3 DAYS THEN THREE TIMES A DAY.  FOLLOW A HIGH FIBER DIET. AVOID ITEMS THAT CAUSE BLOATING & GAS. SEE INFO BELOW.   FOLLOW UP IN 1 YEAR.   High-Fiber Diet A high-fiber diet changes your normal diet to include more whole grains, legumes, fruits, and vegetables. Changes in the diet involve replacing refined carbohydrates with unrefined foods. The calorie level of the diet is essentially unchanged. The Dietary Reference Intake (recommended amount) for adult males is 38 grams per day. For adult females, it is 25 grams per day. Pregnant and lactating women should consume 28 grams of fiber per day. Fiber is the intact part of a plant that is not broken down during digestion. Functional fiber is fiber that has been isolated from the plant to provide a beneficial effect in the body. PURPOSE  Increase stool bulk.   Ease and regulate bowel movements.   Lower cholesterol.  INDICATIONS THAT YOU NEED MORE FIBER  Constipation and hemorrhoids.   Uncomplicated diverticulosis (intestine condition) and irritable bowel syndrome.   Weight management.   As a protective measure against hardening of the arteries (atherosclerosis), diabetes, and cancer.   GUIDELINES FOR INCREASING FIBER IN THE DIET  Start adding fiber to the diet slowly. A gradual increase of about 5 more grams (2 slices of whole-wheat bread, 2 servings of most fruits or vegetables, or 1 bowl of high-fiber cereal) per day is best. Too rapid an increase in fiber may result in constipation, flatulence, and bloating.   Drink enough water and fluids to keep your urine clear or pale yellow. Water, juice, or caffeine-free drinks are recommended. Not drinking enough fluid may cause constipation.   Eat a variety of high-fiber foods rather than one type of fiber.   Try to increase your intake of fiber through using high-fiber foods  rather than fiber pills or supplements that contain small amounts of fiber.   The goal is to change the types of food eaten. Do not supplement your present diet with high-fiber foods, but replace foods in your present diet.  INCLUDE A VARIETY OF FIBER SOURCES  Replace refined and processed grains with whole grains, canned fruits with fresh fruits, and incorporate other fiber sources. White rice, white breads, and most bakery goods contain little or no fiber.   Brown whole-grain rice, buckwheat oats, and many fruits and vegetables are all good sources of fiber. These include: broccoli, Brussels sprouts, cabbage, cauliflower, beets, sweet potatoes, white potatoes (skin on), carrots, tomatoes, eggplant, squash, berries, fresh fruits, and dried fruits.   Cereals appear to be the richest source of fiber. Cereal fiber is found in whole grains and bran. Bran is the fiber-rich outer coat of cereal grain, which is largely removed in refining. In whole-grain cereals, the bran remains. In breakfast cereals, the largest amount of fiber is found in those with "bran" in their names. The fiber content is sometimes indicated on the label.   You may need to include additional fruits and vegetables each day.   In baking, for 1 cup white flour, you may use the following substitutions:   1 cup whole-wheat flour minus 2 tablespoons.   1/2 cup white flour plus 1/2 cup whole-wheat flour.

## 2012-07-28 NOTE — Assessment & Plan Note (Signed)
Sx not ideally controlled. PPI RX BY DR. Lodema Hong, BUT NOT LISTED ON MED LIST FROM FACILITY.  Use Prilosec 30 minutes prior to your first meal. REFILL X 1 YEAR.  FOLLOW UP IN 1 YEAR.

## 2012-07-28 NOTE — Assessment & Plan Note (Signed)
SX NOT IDEALLY CONTROLLED.  DRINK WATER TO KEEP URINE LIGHT YELLOW.  USE FIBER POWDER  ONCE DAILY FOR 3 DAYS THEN TWICE DAILY FOR 3 DAYS THEN THREE TIMES A DAY. FOLLOW A HIGH FIBER DIET. AVOID ITEMS THAT CAUSE BLOATING & GAS.  HO GIVEN. FOLLOW UP IN 1 YEAR.

## 2012-08-01 NOTE — Progress Notes (Signed)
REMINDER APPT MADE 

## 2012-08-30 ENCOUNTER — Other Ambulatory Visit: Payer: Self-pay | Admitting: Family Medicine

## 2012-09-21 DIAGNOSIS — F310 Bipolar disorder, current episode hypomanic: Secondary | ICD-10-CM | POA: Insufficient documentation

## 2012-09-21 DIAGNOSIS — F251 Schizoaffective disorder, depressive type: Secondary | ICD-10-CM | POA: Insufficient documentation

## 2012-10-24 ENCOUNTER — Encounter: Payer: Self-pay | Admitting: Family Medicine

## 2012-10-24 ENCOUNTER — Other Ambulatory Visit (HOSPITAL_COMMUNITY)
Admission: RE | Admit: 2012-10-24 | Discharge: 2012-10-24 | Disposition: A | Discharge status: Home / Self Care | Payer: Medicaid Other | Source: Ambulatory Visit | Attending: Family Medicine | Admitting: Family Medicine

## 2012-10-24 ENCOUNTER — Ambulatory Visit (INDEPENDENT_AMBULATORY_CARE_PROVIDER_SITE_OTHER): Payer: Medicaid Other | Admitting: Family Medicine

## 2012-10-24 VITALS — BP 114/78 | HR 98 | Resp 16 | Ht 62.0 in | Wt 135.0 lb

## 2012-10-24 DIAGNOSIS — Z124 Encounter for screening for malignant neoplasm of cervix: Secondary | ICD-10-CM

## 2012-10-24 DIAGNOSIS — Z1211 Encounter for screening for malignant neoplasm of colon: Secondary | ICD-10-CM

## 2012-10-24 DIAGNOSIS — E119 Type 2 diabetes mellitus without complications: Secondary | ICD-10-CM

## 2012-10-24 DIAGNOSIS — N390 Urinary tract infection, site not specified: Secondary | ICD-10-CM

## 2012-10-24 DIAGNOSIS — E785 Hyperlipidemia, unspecified: Secondary | ICD-10-CM

## 2012-10-24 DIAGNOSIS — K219 Gastro-esophageal reflux disease without esophagitis: Secondary | ICD-10-CM

## 2012-10-24 DIAGNOSIS — Z0000 Encounter for general adult medical examination without abnormal findings: Secondary | ICD-10-CM

## 2012-10-24 DIAGNOSIS — Z1239 Encounter for other screening for malignant neoplasm of breast: Secondary | ICD-10-CM

## 2012-10-24 DIAGNOSIS — J209 Acute bronchitis, unspecified: Secondary | ICD-10-CM

## 2012-10-24 DIAGNOSIS — Z1151 Encounter for screening for human papillomavirus (HPV): Secondary | ICD-10-CM | POA: Insufficient documentation

## 2012-10-24 DIAGNOSIS — Z01419 Encounter for gynecological examination (general) (routine) without abnormal findings: Secondary | ICD-10-CM | POA: Insufficient documentation

## 2012-10-24 LAB — POC HEMOCCULT BLD/STL (OFFICE/1-CARD/DIAGNOSTIC): Fecal Occult Blood, POC: NEGATIVE

## 2012-10-24 LAB — POCT URINALYSIS DIPSTICK
Bilirubin, UA: NEGATIVE
Ketones, UA: NEGATIVE
Leukocytes, UA: NEGATIVE
pH, UA: 8

## 2012-10-24 MED ORDER — RANITIDINE HCL 150 MG PO TABS: 150.0000 mg | ORAL_TABLET | Freq: Two times a day (BID) | ORAL | Status: DC

## 2012-10-24 MED ORDER — PENICILLIN V POTASSIUM 500 MG PO TABS: 500.0000 mg | ORAL_TABLET | Freq: Three times a day (TID) | ORAL | Status: DC

## 2012-10-24 MED ORDER — BENZONATATE 100 MG PO CAPS: 100.0000 mg | ORAL_CAPSULE | Freq: Three times a day (TID) | ORAL | Status: DC | PRN

## 2012-10-24 NOTE — Patient Instructions (Addendum)
F/u in 4 month, call if you need me before  You will be treated for acute bronchitis.  Nurse visit in week for flu vaccine appt will be scheduled before you leave  You are referred for a mammogram , get appt at checkout please  Fasting labs this week, CBc, lipid, cmp and eGFr, HBA1C  CCUA and microalb today in office  Additional medication for reflux for 1 month only, zantac twice daily. Stop caffeine, decaffeinated drinks only  HBA1C and chem 7 in 4.5 month before visit

## 2012-10-25 LAB — MICROALBUMIN / CREATININE URINE RATIO: Microalb Creat Ratio: 12.2 mg/g (ref 0.0–30.0)

## 2012-10-30 DIAGNOSIS — Z0000 Encounter for general adult medical examination without abnormal findings: Secondary | ICD-10-CM | POA: Insufficient documentation

## 2012-10-30 NOTE — Assessment & Plan Note (Signed)
Uncontrolled , pt to start PPI and reduce caffeine

## 2012-10-30 NOTE — Assessment & Plan Note (Signed)
Annual exam as documented Pelvic and breast exam and rectal exam are normal Pap sent. Heme negative stool on exam

## 2012-10-30 NOTE — Progress Notes (Signed)
  Subjective:    Patient ID: Wendy Oneill, female    DOB: [birth year], 57 y.o.   MRN: [id number]  HPI Pt in for annual physical exam  5 day h/o increased cough and chest congestion with yellow sputum , has also had fever and chills  also c/o increased and uncontrolled reflux symptoms with heartburn in the past month. Denies dysphagia   Review of Systems See HPI  Denies sinus pressure, nasal congestion, ear pain or sore throat. . Denies chest pains, palpitations and leg swelling Denies  nausea, vomiting,diarrhea or constipation.   Denies dysuria, frequency, hesitancy or incontinence. Denies joint pain, swelling and limitation in mobility. Denies headaches, seizures, numbness, or tingling. Denies uncontrolled  depression, anxiety or insomnia. Denies skin break down or rash.        Objective:   Physical Exam  Pleasant well nourished female, alert and oriented x 3, in no cardio-pulmonary distress. Afebrile. HEENT No facial trauma or asymetry. Sinuses non tender.  EOMI, PERTL, fundoscopic exam , no hemorhage or exudate.  External ears normal, tympanic membranes clear. Oropharynx moist, no exudate, poor dentition. Neck: supple, no adenopathy,JVD or thyromegaly.No bruits.  Chest: Decreased though adequate air entry, scattered crackles, no wheezes Non tender to palpation  Breast: No asymetry,no masses. No nipple discharge or inversion. No axillary or supraclavicular adenopathy  Cardiovascular system; Heart sounds normal,  S1 and  S2 ,no S3.  No murmur, or thrill. Apical beat not displaced Peripheral pulses normal.  Abdomen: Soft, mild epigastric tenderness, no organomegaly or masses. No bruits. Bowel sounds normal. No guarding, tenderness or rebound.  Rectal:  No mass. Guaiac negative stool.  GU: External genitalia normal. No lesions. Vaginal canal normal.Physiologic  discharge. Uterus normal size, no adnexal masses, no cervical motion or adnexal  tenderness.  Musculoskeletal exam: Full ROM of spine, hips , shoulders and knees. No deformity ,swelling or crepitus noted. No muscle wasting or atrophy.   Neurologic: Cranial nerves 2 to 12 intact. Power, tone ,sensation and reflexes normal throughout. No disturbance in gait. No tremor.  Skin: Intact, no ulceration, erythema , scaling or rash noted. Pigmentation normal throughout  Psych; Normal mood and affect.       Assessment & Plan:

## 2012-10-30 NOTE — Assessment & Plan Note (Signed)
Antibiotic and decongestant prescribed 

## 2012-11-01 ENCOUNTER — Ambulatory Visit (HOSPITAL_COMMUNITY): Payer: Medicaid Other

## 2012-11-11 ENCOUNTER — Ambulatory Visit (HOSPITAL_COMMUNITY)
Admission: RE | Admit: 2012-11-11 | Discharge: 2012-11-11 | Disposition: A | Discharge status: Home / Self Care | Payer: Medicaid Other | Source: Ambulatory Visit | Attending: Family Medicine | Admitting: Family Medicine

## 2012-11-11 DIAGNOSIS — Z1239 Encounter for other screening for malignant neoplasm of breast: Secondary | ICD-10-CM

## 2012-11-11 DIAGNOSIS — Z1231 Encounter for screening mammogram for malignant neoplasm of breast: Secondary | ICD-10-CM | POA: Insufficient documentation

## 2012-11-22 ENCOUNTER — Other Ambulatory Visit: Payer: Self-pay | Admitting: Family Medicine

## 2012-11-24 ENCOUNTER — Other Ambulatory Visit: Payer: Self-pay | Admitting: Family Medicine

## 2012-11-24 LAB — COMPLETE METABOLIC PANEL WITH GFR
AST: 20 U/L (ref 0–37)
Albumin: 3.9 g/dL (ref 3.5–5.2)
Alkaline Phosphatase: 59 U/L (ref 39–117)
BUN: 6 mg/dL (ref 6–23)
Calcium: 9.9 mg/dL (ref 8.4–10.5)
Potassium: 4.1 mEq/L (ref 3.5–5.3)
Sodium: 139 mEq/L (ref 135–145)
Total Bilirubin: 0.2 mg/dL — ABNORMAL LOW (ref 0.3–1.2)

## 2012-11-24 LAB — LIPID PANEL
HDL: 43 mg/dL (ref >39)
LDL Cholesterol: 80 mg/dL (ref 0–99)
Total CHOL/HDL Ratio: 3.5 Ratio
Triglycerides: 146 mg/dL (ref <150)
VLDL: 29 mg/dL (ref 0–40)

## 2012-11-24 LAB — CBC
HCT: 34.2 % — ABNORMAL LOW (ref 36.0–46.0)
Hemoglobin: 11.5 g/dL — ABNORMAL LOW (ref 12.0–15.0)
MCV: 87.7 fL (ref 78.0–100.0)
RBC: 3.9 MIL/uL (ref 3.87–5.11)
RDW: 14.4 % (ref 11.5–15.5)
WBC: 5.8 10*3/uL (ref 4.0–10.5)

## 2012-11-24 LAB — HEMOGLOBIN A1C: Hgb A1c MFr Bld: 7.6 % — ABNORMAL HIGH (ref <5.7)

## 2012-11-25 LAB — MICROALBUMIN / CREATININE URINE RATIO: Microalb Creat Ratio: 3.8 mg/g (ref 0.0–30.0)

## 2012-11-28 ENCOUNTER — Other Ambulatory Visit: Payer: Self-pay | Admitting: Family Medicine

## 2012-11-28 ENCOUNTER — Telehealth: Payer: Self-pay | Admitting: Family Medicine

## 2012-11-28 ENCOUNTER — Other Ambulatory Visit: Payer: Self-pay

## 2012-11-28 LAB — FERRITIN: Ferritin: 7 ng/mL — ABNORMAL LOW (ref 10–291)

## 2012-11-28 LAB — IRON: Iron: 74 ug/dL (ref 42–145)

## 2012-11-28 MED ORDER — IRON 325 (65 FE) MG PO TABS: 1.0000 | ORAL_TABLET | Freq: Every day | ORAL | Status: DC

## 2012-11-28 NOTE — Telephone Encounter (Signed)
Pt needs flu vac, should have  Been scheduled at last visit for nurse visit, she was sick, pls call and give her/caregiver with  appt tate and time info Also her iron is low needs to start once daily iron I am entering pls send after you speak with her

## 2012-11-28 NOTE — Telephone Encounter (Signed)
Home aware and that they can bring her Wednesday at 9 and that I was sending in iron tablets to the pharmacy

## 2012-11-30 ENCOUNTER — Encounter (INDEPENDENT_AMBULATORY_CARE_PROVIDER_SITE_OTHER): Payer: Self-pay

## 2012-11-30 ENCOUNTER — Ambulatory Visit (INDEPENDENT_AMBULATORY_CARE_PROVIDER_SITE_OTHER): Payer: Medicaid Other

## 2012-11-30 DIAGNOSIS — Z23 Encounter for immunization: Secondary | ICD-10-CM

## 2012-12-02 ENCOUNTER — Other Ambulatory Visit: Payer: Self-pay | Admitting: Family Medicine

## 2012-12-02 MED ORDER — METFORMIN HCL ER (MOD) 1000 MG PO TB24: ORAL_TABLET | ORAL | Status: DC

## 2013-01-02 ENCOUNTER — Other Ambulatory Visit: Payer: Self-pay | Admitting: Family Medicine

## 2013-02-07 ENCOUNTER — Ambulatory Visit: Payer: Medicaid Other | Admitting: Family Medicine

## 2013-02-13 ENCOUNTER — Encounter: Payer: Self-pay | Admitting: Family Medicine

## 2013-02-13 ENCOUNTER — Ambulatory Visit (HOSPITAL_COMMUNITY)
Admission: RE | Admit: 2013-02-13 | Discharge: 2013-02-13 | Disposition: A | Discharge status: Home / Self Care | Payer: Medicaid Other | Source: Ambulatory Visit | Attending: Family Medicine | Admitting: Family Medicine

## 2013-02-13 ENCOUNTER — Encounter (INDEPENDENT_AMBULATORY_CARE_PROVIDER_SITE_OTHER): Payer: Self-pay

## 2013-02-13 ENCOUNTER — Ambulatory Visit (INDEPENDENT_AMBULATORY_CARE_PROVIDER_SITE_OTHER): Payer: Medicaid Other | Admitting: Family Medicine

## 2013-02-13 VITALS — BP 118/80 | HR 88 | Resp 16 | Ht 62.0 in | Wt 134.1 lb

## 2013-02-13 DIAGNOSIS — R5381 Other malaise: Secondary | ICD-10-CM

## 2013-02-13 DIAGNOSIS — K219 Gastro-esophageal reflux disease without esophagitis: Secondary | ICD-10-CM

## 2013-02-13 DIAGNOSIS — M25539 Pain in unspecified wrist: Secondary | ICD-10-CM | POA: Insufficient documentation

## 2013-02-13 DIAGNOSIS — E785 Hyperlipidemia, unspecified: Secondary | ICD-10-CM

## 2013-02-13 DIAGNOSIS — M25531 Pain in right wrist: Secondary | ICD-10-CM

## 2013-02-13 DIAGNOSIS — F209 Schizophrenia, unspecified: Secondary | ICD-10-CM

## 2013-02-13 DIAGNOSIS — E119 Type 2 diabetes mellitus without complications: Secondary | ICD-10-CM

## 2013-02-13 DIAGNOSIS — R5383 Other fatigue: Secondary | ICD-10-CM

## 2013-02-13 DIAGNOSIS — J309 Allergic rhinitis, unspecified: Secondary | ICD-10-CM

## 2013-02-13 NOTE — Patient Instructions (Signed)
F/U in mid May, call if you need me before  HBa1C, chem 7 andf EGFR today  Fasting lipid, cmp and EGFr and HBa1c and TSH  In May about 5 days before next visit  Medications will be reviewed after the visit andcorrected   Xray of right hand today and you are referred to Dr Karsten Fells need to see podiatry for foot care

## 2013-02-13 NOTE — Progress Notes (Signed)
   Subjective:    Patient ID: Wendy Oneill, female    DOB: [birth year], 58 y.o.   MRN: [id number]  HPI  The PT is here for follow up and re-evaluation of chronic medical conditions, medication management and review of any available recent lab and radiology data.  Preventive health is updated, specifically  Cancer screening and Immunization.    The PT denies any adverse reactions to current medications since the last visit.  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 No other concerns, however , states fasting sugars are between 140 and 150, and record review reveals that although her dose of metformin was increased several months ago by me, this was not carried out so her dose has been too low. Much time is spent during and after the visit getting this entirely sorted out.    Review of Systems See HPI Denies recent fever or chills. Denies sinus pressure, nasal congestion, ear pain or sore throat. Denies chest congestion, productive cough or wheezing. Denies chest pains, palpitations and leg swelling Denies abdominal pain, nausea, vomiting,diarrhea or constipation.   Denies dysuria, frequency, hesitancy or incontinence.  Denies headaches, seizures, numbness, or tingling. Denies uncontrolled  depression, anxiety or insomnia.Denies hallucinations, followed by psychiatry regularly Denies skin break down or rash.        Objective:   Physical Exam Patient alert and oriented and in no cardiopulmonary distress.  HEENT: No facial asymmetry, EOMI, no sinus tenderness,  oropharynx pink and moist.  Neck supple no adenopathy.  Chest: Clear to auscultation bilaterally.  CVS: S1, S2 no murmurs, no S3.  ABD: Soft non tender. Bowel sounds normal.  Ext: No edema  MS: Adequate ROM spine, shoulders, hips and knees.Adequate ROM right wrist and  forearm, no localized tenderness, warmth or swelling  Skin: Intact, no ulcerations or rash noted.  Psych: Good eye contact, normal affect. Memory impaired  not anxious or depressed appearing.  CNS: CN 2-12 intact, power, tone and sensation normal throughout.        Assessment & Plan:

## 2013-02-14 LAB — COMPLETE METABOLIC PANEL WITH GFR
ALT: 14 U/L (ref 0–35)
AST: 16 U/L (ref 0–37)
Albumin: 4.2 g/dL (ref 3.5–5.2)
Alkaline Phosphatase: 53 U/L (ref 39–117)
BUN: 8 mg/dL (ref 6–23)
CALCIUM: 9.8 mg/dL (ref 8.4–10.5)
CO2: 26 meq/L (ref 19–32)
CREATININE: 0.71 mg/dL (ref 0.50–1.10)
Chloride: 103 mEq/L (ref 96–112)
GLUCOSE: 81 mg/dL (ref 70–99)
Potassium: 4.3 mEq/L (ref 3.5–5.3)
Sodium: 140 mEq/L (ref 135–145)
Total Bilirubin: 0.2 mg/dL — ABNORMAL LOW (ref 0.3–1.2)
Total Protein: 7.5 g/dL (ref 6.0–8.3)

## 2013-02-14 LAB — HEMOGLOBIN A1C
Hgb A1c MFr Bld: 7.3 % — ABNORMAL HIGH (ref <5.7)
MEAN PLASMA GLUCOSE: 163 mg/dL — AB (ref <117)

## 2013-02-17 ENCOUNTER — Telehealth: Payer: Self-pay | Admitting: Family Medicine

## 2013-02-17 NOTE — Telephone Encounter (Signed)
Please advise if you would like to change the metformin or do a pa?

## 2013-02-18 NOTE — Telephone Encounter (Signed)
Let us see what they will pay for, also is the cost of the metformin $4..inhaled corticosteroids THAT an option to fill , will  They pay for 500mg  two twice daily?need more info here pls, we will talk about the options when you have the info

## 2013-02-20 ENCOUNTER — Telehealth: Payer: Self-pay | Admitting: Family Medicine

## 2013-02-20 ENCOUNTER — Other Ambulatory Visit: Payer: Self-pay | Admitting: Family Medicine

## 2013-02-20 ENCOUNTER — Other Ambulatory Visit: Payer: Self-pay

## 2013-02-20 DIAGNOSIS — E119 Type 2 diabetes mellitus without complications: Secondary | ICD-10-CM

## 2013-02-20 NOTE — Assessment & Plan Note (Signed)
Chronic claritin to continue

## 2013-02-20 NOTE — Telephone Encounter (Signed)
Spoke with pharmacist at [facility].  Price difference between 1000mg  and 500mg  extreme.  Med was dispensed as 500mg  ER 4 po daily.

## 2013-02-20 NOTE — Assessment & Plan Note (Signed)
Controlled, no change in medication Treated by psych 

## 2013-02-20 NOTE — Telephone Encounter (Signed)
One other recommendation for Xiao as far as medication is concerned. All diabetics should be on an ACE inhibitor for renal protection, independent of whether their BP is high or not. She has normal BP, she has no ACE allergy noted, she needs to be on an ACE (standard of care) Pls explain this to her caregiver, and pt, I have "entered" lisinopril 5mg  one daily, this needs to be sent in after pt and caregiver are aware of the need to start te medication asap, ( 1 day after you spk with them should suffice) if you feel you need to spk to [facility] also , pls do so , thank you!

## 2013-02-20 NOTE — Assessment & Plan Note (Addendum)
UnControlled,  Increase med odse to 2000mg  metformin  Daily. Patient advised to reduce carb and sweets, commit to regular physical activity, take meds as prescribed, test blood as directed, and attempt to lose weight, to improve blood sugar control. Needs to start an ACE for renal protection will need to contact caregiver after the visit Needs foot care, has resisted due to co pay, advised pt of the need for this, she will consider this, no referral made at this visit

## 2013-02-20 NOTE — Assessment & Plan Note (Signed)
Controlled, no change in medication Hyperlipidemia:Low fat diet discussed and encouraged.  Updated lab needed at/ before next visit.  

## 2013-02-20 NOTE — Assessment & Plan Note (Signed)
1 month history following trauma. Exam is within normal, pt reports "feeling a pop" xray today an ortho eval

## 2013-02-20 NOTE — Assessment & Plan Note (Signed)
Controlled, no change in medication  

## 2013-02-21 MED ORDER — LISINOPRIL 5 MG PO TABS: 5.0000 mg | ORAL_TABLET | Freq: Every day | ORAL | Status: DC

## 2013-02-21 NOTE — Telephone Encounter (Signed)
Med sent and caregiver at facilty aware and wrote information down

## 2013-02-28 ENCOUNTER — Encounter: Payer: Self-pay | Admitting: Orthopedic Surgery

## 2013-02-28 ENCOUNTER — Ambulatory Visit: Payer: Medicaid Other | Admitting: Family Medicine

## 2013-02-28 ENCOUNTER — Ambulatory Visit (INDEPENDENT_AMBULATORY_CARE_PROVIDER_SITE_OTHER): Payer: Medicaid Other | Admitting: Orthopedic Surgery

## 2013-02-28 VITALS — BP 119/71 | Ht 62.0 in | Wt 134.0 lb

## 2013-02-28 DIAGNOSIS — M654 Radial styloid tenosynovitis [de Quervain]: Secondary | ICD-10-CM

## 2013-02-28 NOTE — Progress Notes (Signed)
  Subjective:    Wendy Oneill is an 58 y.o. female who presents for evaluation of right wrist pain. Onset was Secondary to an injury in January about 2 weeks ago she went to help her resident get up and felt a pop over the radial aspect of the right wrist associated with pain and swelling in the right thumb. The pain is constant it 7/10 it burns and has throbbing associated with it. There is some catching sensation as well.. The pain is severe, worsens with movement, and is relieved by rest. There is associated numbness in thumb area and wrist area. There is no history of injury, strain, overuse. Evaluation to date: plain films: normal. Treatment to date: nothing specific.  The following portions of the patient's history were reviewed and updated as appropriate: allergies, current medications, past family history, past medical history, past social history, past surgical history and problem list.  Review of Systems A comprehensive review of systems was negative.  except for joint pain  Objective:    BP 119/71  Ht 5\' 2"  (1.575 m)  Wt 134 lb (60.782 kg)  BMI 24.50 kg/m2  LMP 07/06/2012       Overall general appearance is normal she is oriented x3 her mood is pleasant she has no walking abnormalities inspection reveals tenderness over the radial aspect of the wrist including the right thumb with a positive Finkelstein's test was normal flexion-extension passive range of motion painful ulnar deviation. Intact extensor pollicis longus and brevis skin intact normal pulse normal sensation no lymphadenopathy Imaging: X-ray right wrist: no fracture, dislocation, swelling or degenerative changes noted   Assessment:    Wrist tendonitis on the right side   Plan:    Thumb splint for 6 weeks

## 2013-02-28 NOTE — Patient Instructions (Signed)
Wear brace 6 weeks except for bathing

## 2013-03-20 LAB — BASIC METABOLIC PANEL
BUN: 11 mg/dL (ref 6–23)
CO2: 28 mEq/L (ref 19–32)
Calcium: 9.6 mg/dL (ref 8.4–10.5)
Chloride: 102 mEq/L (ref 96–112)
Creat: 0.77 mg/dL (ref 0.50–1.10)
Glucose, Bld: 101 mg/dL — ABNORMAL HIGH (ref 70–99)
Potassium: 3.9 mEq/L (ref 3.5–5.3)
Sodium: 138 mEq/L (ref 135–145)

## 2013-03-21 LAB — HEMOGLOBIN A1C
Hgb A1c MFr Bld: 7.1 % — ABNORMAL HIGH (ref <5.7)
Mean Plasma Glucose: 157 mg/dL — ABNORMAL HIGH (ref <117)

## 2013-03-22 ENCOUNTER — Other Ambulatory Visit: Payer: Self-pay | Admitting: Family Medicine

## 2013-03-31 ENCOUNTER — Other Ambulatory Visit: Payer: Self-pay | Admitting: Family Medicine

## 2013-04-11 ENCOUNTER — Other Ambulatory Visit: Payer: Self-pay | Admitting: Family Medicine

## 2013-04-11 ENCOUNTER — Ambulatory Visit (INDEPENDENT_AMBULATORY_CARE_PROVIDER_SITE_OTHER): Payer: Medicaid Other | Admitting: Orthopedic Surgery

## 2013-04-11 ENCOUNTER — Encounter: Payer: Self-pay | Admitting: Orthopedic Surgery

## 2013-04-11 VITALS — BP 109/63 | Ht 62.0 in | Wt 134.0 lb

## 2013-04-11 DIAGNOSIS — M654 Radial styloid tenosynovitis [de Quervain]: Secondary | ICD-10-CM

## 2013-04-11 NOTE — Progress Notes (Signed)
Patient ID: Wendy Oneill, female   DOB: Jun 28, 1955, 58 y.o.   MRN: [id number]  Chief Complaint  Patient presents with  . Follow-up    6 week recheck on right wrist. DOI 02-14-13.    Recheck right wrist treated for de Quervain's syndrome still complains of pain and numbness and tingling of the thumb with pain in the first compartment  System review negative  General appearance is normal, the patient is alert and oriented x3 with normal mood and affect. Tenderness and swelling in the first compartment painful ulnar deviation positive Finkelstein's with decreased sensation skin is normal radial side of the thumb is normal pulse and perfusion normal capillary refill  De Quervain's syndrome  Injection with verbal consent and timeout the right first extensor compartment was injected with Depo-Medrol 1 cc and lidocaine 1 cc tolerated well without complication. Skin prep was done with alcohol and ethyl chloride. No complications. Followup one month if no improvement discuss surgery

## 2013-04-11 NOTE — Patient Instructions (Addendum)
Wear brace for one month  Joint Injection  Care After  Refer to this sheet in the next few days. These instructions provide you with information on caring for yourself after you have had a joint injection. Your caregiver also may give you more specific instructions. Your treatment has been planned according to current medical practices, but problems sometimes occur. Call your caregiver if you have any problems or questions after your procedure.  After any type of joint injection, it is not uncommon to experience:  Soreness, swelling, or bruising around the injection site.  Mild numbness, tingling, or weakness around the injection site caused by the numbing medicine used before or with the injection. It also is possible to experience the following effects associated with the specific agent after injection:  Iodine-based contrast agents:  Allergic reaction (itching, hives, widespread redness, and swelling beyond the injection site).  Corticosteroids (These effects are rare.):  Allergic reaction.  Increased blood sugar levels (If you have diabetes and you notice that your blood sugar levels have increased, notify your caregiver).  Increased blood pressure levels.  Mood swings.  Hyaluronic acid in the use of viscosupplementation.  Temporary heat or redness.  Temporary rash and itching.  Increased fluid accumulation in the injected joint. These effects all should resolve within a day after your procedure.  HOME CARE INSTRUCTIONS  Limit yourself to light activity the day of your procedure. Avoid lifting heavy objects, bending, stooping, or twisting.  Take prescription or over-the-counter pain medication as directed by your caregiver.  You may apply ice to your injection site to reduce pain and swelling the day of your procedure. Ice may be applied 3-4 times:  Put ice in a plastic bag.  Place a towel between your skin and the bag.  Leave the ice on for no longer than 15-20 minutes each  time. SEEK IMMEDIATE MEDICAL CARE IF:  Pain and swelling get worse rather than better or extend beyond the injection site.  Numbness does not go away.  Blood or fluid continues to leak from the injection site.  You have chest pain.  You have swelling of your face or tongue.  You have trouble breathing or you become dizzy.  You develop a fever, chills, or severe tenderness at the injection site that last longer than 1 day. MAKE SURE YOU:  Understand these instructions.  Watch your condition.  Get help right away if you are not doing well or if you get worse. Document Released: 09/18/2010 Document Revised: 03/30/2011 Document Reviewed: 09/18/2010  [facility] Patient Information 2014 [place].

## 2013-04-20 ENCOUNTER — Other Ambulatory Visit: Payer: Self-pay | Admitting: *Deleted

## 2013-04-20 DIAGNOSIS — M654 Radial styloid tenosynovitis [de Quervain]: Secondary | ICD-10-CM

## 2013-04-20 MED ORDER — IBUPROFEN 800 MG PO TABS: 800.0000 mg | ORAL_TABLET | Freq: Three times a day (TID) | ORAL | Status: DC

## 2013-05-16 ENCOUNTER — Ambulatory Visit (INDEPENDENT_AMBULATORY_CARE_PROVIDER_SITE_OTHER): Payer: Medicaid Other | Admitting: Orthopedic Surgery

## 2013-05-16 VITALS — BP 128/84 | Ht 62.0 in | Wt 134.0 lb

## 2013-05-16 DIAGNOSIS — M654 Radial styloid tenosynovitis [de Quervain]: Secondary | ICD-10-CM

## 2013-05-16 NOTE — Patient Instructions (Signed)
Wear brace for 3 more weeks then remove.

## 2013-05-16 NOTE — Progress Notes (Signed)
Patient ID: Wendy Oneill, female   DOB: 10/06/1955, 58 y.o.   MRN: [id number] Established patient recheck  BP 128/84  Ht 5\' 2"  (1.575 m)  Wt 134 lb (60.782 kg)  BMI 24.50 kg/m2  LMP 07/06/2012 Chief Complaint  Patient presents with  . Follow-up    1 month recheck on right wrist after injection and brace.    Diagnosis de Quervain's syndrome  Previous treatment splinting injection  Complaints resolution of symptoms  Exam mild tenderness mild Finkelstein's but overall improved  Brace 3 weeks then remove  Followup as needed

## 2013-05-23 ENCOUNTER — Other Ambulatory Visit: Payer: Self-pay | Admitting: Family Medicine

## 2013-06-06 ENCOUNTER — Ambulatory Visit (INDEPENDENT_AMBULATORY_CARE_PROVIDER_SITE_OTHER): Payer: Medicaid Other | Admitting: Family Medicine

## 2013-06-06 ENCOUNTER — Encounter: Payer: Self-pay | Admitting: Family Medicine

## 2013-06-06 VITALS — BP 110/78 | HR 108 | Resp 16 | Ht 62.0 in | Wt 131.0 lb

## 2013-06-06 DIAGNOSIS — E785 Hyperlipidemia, unspecified: Secondary | ICD-10-CM

## 2013-06-06 DIAGNOSIS — I10 Essential (primary) hypertension: Secondary | ICD-10-CM

## 2013-06-06 DIAGNOSIS — K219 Gastro-esophageal reflux disease without esophagitis: Secondary | ICD-10-CM

## 2013-06-06 DIAGNOSIS — E119 Type 2 diabetes mellitus without complications: Secondary | ICD-10-CM

## 2013-06-06 DIAGNOSIS — F209 Schizophrenia, unspecified: Secondary | ICD-10-CM

## 2013-06-06 DIAGNOSIS — J3089 Other allergic rhinitis: Secondary | ICD-10-CM

## 2013-06-06 NOTE — Patient Instructions (Addendum)
F/u in 4.5 month, with rectal exam call if you need me before  Call in September for flu vaccine  Your eye exam will be due with Dr  Radford Pax July 24 or after, we will refer you for this   Fasting lipid, cmp and EGFR, hBA1C  June 10 or after  You are doing well, keep reading , no changes in medication doses at this time

## 2013-06-07 ENCOUNTER — Telehealth: Payer: Self-pay | Admitting: Family Medicine

## 2013-06-07 ENCOUNTER — Telehealth: Payer: Self-pay | Admitting: Gastroenterology

## 2013-06-07 NOTE — Telephone Encounter (Signed)
Wendy Oneill called from the group home and stated that Barney did not want to take the [place] is asking [place] to fax her a discontinuation of the [employer] [phone] please advise

## 2013-06-08 NOTE — Telephone Encounter (Signed)
I called the home and spoke to the supervisor, Blanch Media. She said that the patient has been refusing the Benefiber for some time and her BM's are normal at this time.  They would like to have a D.C order for the Benefiber.

## 2013-06-08 NOTE — Telephone Encounter (Signed)
Fax order to D/C BENEFIBER.

## 2013-06-08 NOTE — Telephone Encounter (Signed)
rx sent to [facility]

## 2013-06-15 NOTE — Telephone Encounter (Signed)
D/C order was faxed on 06/08/2013.

## 2013-06-28 ENCOUNTER — Other Ambulatory Visit: Payer: Self-pay

## 2013-07-05 ENCOUNTER — Telehealth: Payer: Self-pay | Admitting: Family Medicine

## 2013-07-05 NOTE — Telephone Encounter (Signed)
Ok to D/c the benefiber?

## 2013-07-05 NOTE — Telephone Encounter (Signed)
Yes this is ok, just also pls let [facility]ly and pot know that she needs to ensure adequate diet in her fiber with shredded wheat, all bran, raisin bran etc so that she does not have constipation issues which the bene fiber was preventing. Pls fax stamped  d/c order to facility and notify pharmacy also

## 2013-07-06 NOTE — Telephone Encounter (Signed)
D/C ORDER SENT

## 2013-07-20 LAB — COMPLETE METABOLIC PANEL WITH GFR
ALBUMIN: 4.1 g/dL (ref 3.5–5.2)
ALT: 14 U/L (ref 0–35)
AST: 17 U/L (ref 0–37)
Alkaline Phosphatase: 43 U/L (ref 39–117)
BILIRUBIN TOTAL: 0.3 mg/dL (ref 0.2–1.2)
BUN: 12 mg/dL (ref 6–23)
CO2: 29 mEq/L (ref 19–32)
Calcium: 10.3 mg/dL (ref 8.4–10.5)
Chloride: 103 mEq/L (ref 96–112)
Creat: 0.83 mg/dL (ref 0.50–1.10)
GFR, EST NON AFRICAN AMERICAN: 79 mL/min
GFR, Est African American: >89 mL/min
GLUCOSE: 99 mg/dL (ref 70–99)
POTASSIUM: 4.9 meq/L (ref 3.5–5.3)
SODIUM: 141 meq/L (ref 135–145)
TOTAL PROTEIN: 7.1 g/dL (ref 6.0–8.3)

## 2013-07-20 LAB — LIPID PANEL
Cholesterol: 137 mg/dL (ref 0–200)
HDL: 47 mg/dL (ref >39)
LDL Cholesterol: 76 mg/dL (ref 0–99)
TRIGLYCERIDES: 72 mg/dL (ref <150)
Total CHOL/HDL Ratio: 2.9 Ratio
VLDL: 14 mg/dL (ref 0–40)

## 2013-07-20 LAB — HEMOGLOBIN A1C
HEMOGLOBIN A1C: 6.4 % — AB (ref <5.7)
Mean Plasma Glucose: 137 mg/dL — ABNORMAL HIGH (ref <117)

## 2013-07-26 ENCOUNTER — Encounter: Payer: Self-pay | Admitting: Gastroenterology

## 2013-08-22 ENCOUNTER — Other Ambulatory Visit: Payer: Self-pay | Admitting: Family Medicine

## 2013-09-13 ENCOUNTER — Ambulatory Visit (INDEPENDENT_AMBULATORY_CARE_PROVIDER_SITE_OTHER): Payer: Medicaid Other | Admitting: Gastroenterology

## 2013-09-13 ENCOUNTER — Encounter (INDEPENDENT_AMBULATORY_CARE_PROVIDER_SITE_OTHER): Payer: Self-pay

## 2013-09-13 ENCOUNTER — Encounter: Payer: Self-pay | Admitting: Gastroenterology

## 2013-09-13 VITALS — BP 108/74 | HR 88 | Temp 98.2°F | Ht 62.0 in | Wt 128.8 lb

## 2013-09-13 DIAGNOSIS — Z86010 Personal history of colonic polyps: Secondary | ICD-10-CM

## 2013-09-13 DIAGNOSIS — K219 Gastro-esophageal reflux disease without esophagitis: Secondary | ICD-10-CM

## 2013-09-13 MED ORDER — OMEPRAZOLE 20 MG PO CPDR: DELAYED_RELEASE_CAPSULE | ORAL | Status: DC

## 2013-09-13 NOTE — Progress Notes (Signed)
   Subjective:    Patient ID: Wendy Oneill, female    DOB: Jul 20, 1955, 58 y.o.   MRN: [id number]  HPI  RARE NAUSEA AND CHILLS. BMs: DAILY SOMETIMES UP TO 3X/DAY. OCCASIONAL PAIN IN LOWER STOMACH BEFORE BOWELS MOVE.WEIGH STABLE SINCE 2012.  PT DENIES FEVER, CHILLS, HEMATOCHEZIA, vomiting, melena, diarrhea, CHEST PAIN, SHORTNESS OF BREATH,  CHANGE IN BOWEL IN HABITS, constipation,  problems swallowing, OR heartburn or indigestion.   Past Medical History  Diagnosis Date  . Allergic rhinitis   . Personal history of schizophrenia   . Diabetes mellitus, type 1   . Hyperlipidemia   . Adenoma May 2010    Simple : TCS   . GERD (gastroesophageal reflux disease)     Past Surgical History  Procedure Laterality Date  . Multiple tooth extractions  Feb 03, 2010  . Colonoscopy  MAY 2010    SIMPLE ADENOMAS   Allergies  Allergen Reactions  . Risperidone   . Sulfonamide Derivatives       Review of Systems     Objective:   Physical Exam  Vitals reviewed. Constitutional: She is oriented to person, place, and time. She appears well-developed and well-nourished. No distress.  HENT:  Head: Normocephalic and atraumatic.  Mouth/Throat: Oropharynx is clear and moist. No oropharyngeal exudate.  Eyes: Pupils are equal, round, and reactive to light. No scleral icterus.  Neck: Normal range of motion. Neck supple.  Cardiovascular: Normal rate, regular rhythm and normal heart sounds.   Pulmonary/Chest: Effort normal and breath sounds normal. No respiratory distress.  Abdominal: Soft. Bowel sounds are normal. She exhibits no distension. There is no tenderness.  Musculoskeletal: She exhibits no edema.  BRACE ON R WRIST  Lymphadenopathy:    She has no cervical adenopathy.  Neurological: She is alert and oriented to person, place, and time.  NO  NEW FOCAL DEFICITS   Psychiatric:  FLAT AFFECT, NL MOOD           Assessment & Plan:

## 2013-09-13 NOTE — Patient Instructions (Signed)
FOLLOW A HIGH FIBER/LOW FAT DIET.   AVOID TRIGGERS FOR REFLUX. SEE INFO BELOW.  CONTINUE OMEPRAZOLE.  TAKE 30 MINUTES PRIOR TO YOUR FIST MEAL.  FOLLOW UP IN 1 YEAR. MERRY CHRISTMAS AND HAPPY NEW YEAR!   Lifestyle and home remedies TO HELP CONTROL HEARTBURN.  You may eliminate or reduce the frequency of heartburn by making the following lifestyle changes:    Control your weight. Being overweight is a major risk factor for heartburn and GERD. Excess pounds put pressure on your abdomen, pushing up your stomach and causing acid to back up into your esophagus.     Eat smaller meals. 4 TO 6 MEALS A DAY. This reduces pressure on the lower esophageal sphincter, helping to prevent the valve from opening and acid from washing back into your esophagus.     Loosen your belt. Clothes that fit tightly around your waist put pressure on your abdomen and the lower esophageal sphincter.       Eliminate heartburn triggers. Everyone has specific triggers.      Common triggers such as fatty or fried foods, spicy food, tomato sauce, carbonated beverages, alcohol, chocolate, mint, garlic, onion, caffeine and nicotine may make heartburn worse.     Avoid stooping or bending. Tying your shoes is OK. Bending over for longer periods to weed your garden isn't, especially soon after eating.     Don't lie down after a meal. Wait at least three to four hours after eating before going to bed, and don't lie down right after eating.

## 2013-09-13 NOTE — Assessment & Plan Note (Signed)
REVIEWED TCS FROM MAY 2010-2 SIMPLE ADENOMAS REMOVED.  NEXT TCS IN 2020 UNLESS PT DEVELOPS SX HIGH FIBER DIET

## 2013-09-13 NOTE — Progress Notes (Signed)
Reminder in epic °

## 2013-09-13 NOTE — Assessment & Plan Note (Signed)
SX CONTROLLED.  REFILL OMP FOR 1 YEAR LOW FAT DIET FOLLOW UP IN 1 YEAR.

## 2013-09-21 ENCOUNTER — Other Ambulatory Visit: Payer: Self-pay | Admitting: Family Medicine

## 2013-10-20 ENCOUNTER — Telehealth: Payer: Self-pay | Admitting: Family Medicine

## 2013-10-20 NOTE — Telephone Encounter (Signed)
Pt needs to take lovastatin 20mg  at bedtime, notify [facility] care and family home pls. Pls send in 4 refills , and paperwork wil`l need to be corrected before I  Can sign

## 2013-10-23 ENCOUNTER — Other Ambulatory Visit: Payer: Self-pay

## 2013-10-23 MED ORDER — LOVASTATIN 20 MG PO TABS: ORAL_TABLET | ORAL | Status: DC

## 2013-10-23 NOTE — Telephone Encounter (Signed)
Facility and [facility] aware. Med sent

## 2013-10-24 ENCOUNTER — Other Ambulatory Visit: Payer: Self-pay | Admitting: Family Medicine

## 2013-10-26 ENCOUNTER — Ambulatory Visit (INDEPENDENT_AMBULATORY_CARE_PROVIDER_SITE_OTHER): Payer: Medicaid Other | Admitting: Family Medicine

## 2013-10-26 ENCOUNTER — Encounter (INDEPENDENT_AMBULATORY_CARE_PROVIDER_SITE_OTHER): Payer: Self-pay

## 2013-10-26 ENCOUNTER — Encounter: Payer: Self-pay | Admitting: Family Medicine

## 2013-10-26 VITALS — BP 128/84 | HR 83 | Resp 16 | Wt 129.4 lb

## 2013-10-26 DIAGNOSIS — D539 Nutritional anemia, unspecified: Secondary | ICD-10-CM

## 2013-10-26 DIAGNOSIS — IMO0001 Reserved for inherently not codable concepts without codable children: Secondary | ICD-10-CM

## 2013-10-26 DIAGNOSIS — K219 Gastro-esophageal reflux disease without esophagitis: Secondary | ICD-10-CM

## 2013-10-26 DIAGNOSIS — Z1211 Encounter for screening for malignant neoplasm of colon: Secondary | ICD-10-CM

## 2013-10-26 DIAGNOSIS — J3089 Other allergic rhinitis: Secondary | ICD-10-CM

## 2013-10-26 DIAGNOSIS — E119 Type 2 diabetes mellitus without complications: Secondary | ICD-10-CM

## 2013-10-26 DIAGNOSIS — Z23 Encounter for immunization: Secondary | ICD-10-CM

## 2013-10-26 DIAGNOSIS — E785 Hyperlipidemia, unspecified: Secondary | ICD-10-CM

## 2013-10-26 DIAGNOSIS — F209 Schizophrenia, unspecified: Secondary | ICD-10-CM

## 2013-10-26 LAB — COMPLETE METABOLIC PANEL WITH GFR
ALBUMIN: 4.3 g/dL (ref 3.5–5.2)
ALT: 12 U/L (ref 0–35)
AST: 15 U/L (ref 0–37)
Alkaline Phosphatase: 48 U/L (ref 39–117)
BUN: 9 mg/dL (ref 6–23)
CALCIUM: 10.2 mg/dL (ref 8.4–10.5)
CO2: 26 mEq/L (ref 19–32)
Chloride: 104 mEq/L (ref 96–112)
Creat: 0.73 mg/dL (ref 0.50–1.10)
GFR, Est African American: >89 mL/min
Glucose, Bld: 91 mg/dL (ref 70–99)
Potassium: 4.5 mEq/L (ref 3.5–5.3)
Sodium: 140 mEq/L (ref 135–145)
Total Bilirubin: 0.3 mg/dL (ref 0.2–1.2)
Total Protein: 7.3 g/dL (ref 6.0–8.3)

## 2013-10-26 LAB — HEMOCCULT GUIAC POC 1CARD (OFFICE): FECAL OCCULT BLD: NEGATIVE

## 2013-10-26 LAB — CBC
HCT: 38.5 % (ref 36.0–46.0)
Hemoglobin: 13.1 g/dL (ref 12.0–15.0)
MCH: 31 pg (ref 26.0–34.0)
MCHC: 34 g/dL (ref 30.0–36.0)
MCV: 91.2 fL (ref 78.0–100.0)
Platelets: 375 10*3/uL (ref 150–400)
RBC: 4.22 MIL/uL (ref 3.87–5.11)
RDW: 13.5 % (ref 11.5–15.5)
WBC: 4.9 10*3/uL (ref 4.0–10.5)

## 2013-10-26 LAB — FERRITIN: Ferritin: 16 ng/mL (ref 10–291)

## 2013-10-26 LAB — HEMOGLOBIN A1C
Hgb A1c MFr Bld: 6.9 % — ABNORMAL HIGH (ref <5.7)
Mean Plasma Glucose: 151 mg/dL — ABNORMAL HIGH (ref <117)

## 2013-10-26 LAB — TSH: TSH: 0.833 u[IU]/mL (ref 0.350–4.500)

## 2013-10-26 LAB — IRON: IRON: 125 ug/dL (ref 42–145)

## 2013-10-26 MED ORDER — FLUTICASONE PROPIONATE 50 MCG/ACT NA SUSP: 2.0000 | Freq: Every day | NASAL | Status: DC

## 2013-10-26 NOTE — Patient Instructions (Signed)
F/u in 4.5 month, call if you need me before  Headache Is from allergies, flonase is sent  To use daily in addition to claritin for allergies  Two tylenol tablets in office for headache  Foot exam is good  Sign for eye exam from this yar  Flu vaccine todAY  RECTAL EXAM TODAY  LABS TODAY, AND A DIFFERENT SHEET FOR LABS FASTING IN 4.5 MONTHS ABOUT 3 TO 5 DAYS BEFORE THAT VISIT

## 2013-10-27 DIAGNOSIS — Z23 Encounter for immunization: Secondary | ICD-10-CM | POA: Insufficient documentation

## 2013-10-27 DIAGNOSIS — Z1211 Encounter for screening for malignant neoplasm of colon: Secondary | ICD-10-CM | POA: Insufficient documentation

## 2013-10-27 LAB — HM DIABETES EYE EXAM

## 2013-10-27 NOTE — Assessment & Plan Note (Signed)
Controlled, no change in medication  

## 2013-10-27 NOTE — Assessment & Plan Note (Signed)
Controlled, no change in medication Hyperlipidemia:Low fat diet discussed and encouraged.  \ 

## 2013-10-27 NOTE — Assessment & Plan Note (Signed)
Increased symptoms causing headache , needs to use flonase daily

## 2013-10-27 NOTE — Assessment & Plan Note (Signed)
Controlled, no change in medication Followed by psych 

## 2013-10-27 NOTE — Assessment & Plan Note (Signed)
Controlled, no change in medication Patient advised to reduce carb and sweets, commit to regular physical activity, take meds as prescribed, test blood as directed, and attempt to lose weight, to improve blood sugar control.  

## 2013-10-27 NOTE — Progress Notes (Signed)
   Subjective:    Patient ID: Wendy Oneill, female    DOB: 12/11/1955, 58 y.o.   MRN: [id number]  HPI The PT is here for follow up and re-evaluation of chronic medical conditions, medication management and review of any available recent lab and radiology data.  Preventive health is updated, specifically  Cancer screening and Immunization.   Questions or concerns regarding consultations or procedures which the PT has had in the interim are  addressed. The PT denies any adverse reactions to current medications since the last visit.  There are no new concerns.  C/o intermittent frontal headaches for past week, but denies fever, chills or green drainage      Review of Systems See HPI Denies recent fever or chills. Denies sinus pressure,  Has had increased nasal congestion, denies  ear pain or sore throat. Denies chest congestion, productive cough or wheezing. Denies chest pains, palpitations and leg swelling Denies abdominal pain, nausea, vomiting,diarrhea or constipation.   Denies dysuria, frequency, hesitancy or incontinence. Denies joint pain, swelling and limitation in mobility. Denies , seizures, numbness, or tingling. Denies depression, anxiety or insomnia. Denies skin break down or rash.        Objective:   Physical Exam  BP 128/84  Pulse 83  Resp 16  Wt 129 lb 6.4 oz (58.695 kg)  SpO2 98%  LMP 07/06/2012 Patient alert and oriented and in no cardiopulmonary distress.  HEENT: No facial asymmetry, EOMI,   oropharynx pink and moist.  Neck supple no JVD, no mass.  Chest: Clear to auscultation bilaterally.  CVS: S1, S2 no murmurs, no S3.Regular rate.  ABD: Soft non tender. No organomegaly or mass Rectal: normal sphincter tone, no mass, heme negative stool  Ext: No edema  MS: Adequate ROM spine, shoulders, hips and knees.  Skin: Intact, no ulcerations or rash noted.  Psych: Good eye contact, normal affect. Memory intact not anxious or depressed  appearing.  CNS: CN 2-12 intact, power,  normal throughout.no focal deficits noted.       Assessment & Plan:  Non-insulin-dependent diabetes mellitus without complications Controlled, no change in medication Patient advised to reduce carb and sweets, commit to regular physical activity, take meds as prescribed, test blood as directed, and attempt to lose weight, to improve blood sugar control.   Hyperlipidemia LDL goal <100 Controlled, no change in medication Hyperlipidemia:Low fat diet discussed and encouraged.    GERD Controlled, no change in medication   Allergic rhinitis Increased symptoms causing headache , needs to use flonase daily  Need for prophylactic vaccination and inoculation against influenza Vaccine administered at visit.   Schizophrenia, unspecified type Controlled, no change in medication Followed by psych   Special screening for malignant neoplasms, colon Rectal exam at visit, heme negative stool

## 2013-10-27 NOTE — Assessment & Plan Note (Signed)
Vaccine administered at visit.  

## 2013-10-27 NOTE — Assessment & Plan Note (Signed)
Rectal exam at visit, heme negative stool

## 2013-11-13 NOTE — Assessment & Plan Note (Signed)
Controlled, no change in medication  

## 2013-11-13 NOTE — Assessment & Plan Note (Signed)
Patient advised to reduce carb and sweets, commit to regular physical activity, take meds as prescribed, test blood as directed, and attempt to lose weight, to improve blood sugar control. Controlled, no change in medication   

## 2013-11-13 NOTE — Progress Notes (Signed)
   Subjective:    Patient ID: Wendy Oneill, female    DOB: 1955-03-24, 58 y.o.   MRN: [id number]  HPI The PT is here for follow up and re-evaluation of chronic medical conditions, medication management and review of any available recent lab and radiology data.  Preventive health is updated, specifically  Cancer screening and Immunization.   Questions or concerns regarding consultations or procedures which the PT has had in the interim are  addressed. The PT denies any adverse reactions to current medications since the last visit.  There are no new concerns.  There are no specific complaints       Review of Systems See HPI Denies recent fever or chills. Denies sinus pressure, nasal congestion, ear pain or sore throat. Denies chest congestion, productive cough or wheezing. Denies chest pains, palpitations and leg swelling Denies abdominal pain, nausea, vomiting,diarrhea or constipation.   Denies dysuria, frequency, hesitancy or incontinence. Denies joint pain, swelling and limitation in mobility. Denies headaches, seizures, numbness, or tingling. Denies depression, anxiety or insomnia. Denies skin break down or rash.        Objective:   Physical Exam  BP 110/78  Pulse 108  Resp 16  Ht 5\' 2"  (1.575 m)  Wt 131 lb (59.421 kg)  BMI 23.95 kg/m2  SpO2 99%  LMP 07/06/2012 Patient alert and oriented and in no cardiopulmonary distress.  HEENT: No facial asymmetry, EOMI,   oropharynx pink and moist.  Neck supple no JVD, no mass.  Chest: Clear to auscultation bilaterally.  CVS: S1, S2 no murmurs, no S3.Regular rate.  ABD: Soft non tender.   Ext: No edema  MS: Adequate ROM spine, shoulders, hips and knees.  Skin: Intact, no ulcerations or rash noted.  Psych: Good eye contact, normal affect. Memory intact not anxious or depressed appearing.  CNS: CN 2-12 intact, power,  normal throughout.no focal deficits noted.       Assessment & Plan:  Non-insulin-dependent  diabetes mellitus without complications Patient advised to reduce carb and sweets, commit to regular physical activity, take meds as prescribed, test blood as directed, and attempt to lose weight, to improve blood sugar control. Controlled, no change in medication   Hyperlipidemia LDL goal <100 Hyperlipidemia:Low fat diet discussed and encouraged.  Controlled, no change in medication   Gastro-esophageal reflux Controlled, no change in medication   HTN, goal below 130/80 Controlled, no change in medication DASH diet and commitment to daily physical activity for a minimum of 30 minutes discussed and encouraged, as a part of hypertension management. The importance of attaining a healthy weight is also discussed.   Allergic rhinitis Controlled, no change in medication   Schizophrenia, unspecified type Controlled, no change in medication, treated by psych

## 2013-11-13 NOTE — Assessment & Plan Note (Signed)
Hyperlipidemia:Low fat diet discussed and encouraged.  Controlled, no change in medication   

## 2013-11-14 ENCOUNTER — Other Ambulatory Visit: Payer: Self-pay | Admitting: Family Medicine

## 2013-11-14 DIAGNOSIS — Z1231 Encounter for screening mammogram for malignant neoplasm of breast: Secondary | ICD-10-CM

## 2013-11-16 ENCOUNTER — Ambulatory Visit (HOSPITAL_COMMUNITY)
Admission: RE | Admit: 2013-11-16 | Discharge: 2013-11-16 | Disposition: A | Discharge status: Home / Self Care | Payer: Medicaid Other | Source: Ambulatory Visit | Attending: Family Medicine | Admitting: Family Medicine

## 2013-11-16 DIAGNOSIS — Z1231 Encounter for screening mammogram for malignant neoplasm of breast: Secondary | ICD-10-CM | POA: Diagnosis not present

## 2013-11-16 DIAGNOSIS — I10 Essential (primary) hypertension: Secondary | ICD-10-CM | POA: Insufficient documentation

## 2013-11-16 NOTE — Assessment & Plan Note (Addendum)
Controlled, no change in medication, treated by psych

## 2013-11-16 NOTE — Assessment & Plan Note (Signed)
Controlled, no change in medication  

## 2013-11-16 NOTE — Assessment & Plan Note (Signed)
Controlled, no change in medication DASH diet and commitment to daily physical activity for a minimum of 30 minutes discussed and encouraged, as a part of hypertension management. The importance of attaining a healthy weight is also discussed.  

## 2013-12-22 ENCOUNTER — Other Ambulatory Visit: Payer: Self-pay | Admitting: Family Medicine

## 2014-01-23 ENCOUNTER — Other Ambulatory Visit: Payer: Self-pay | Admitting: Family Medicine

## 2014-01-25 ENCOUNTER — Other Ambulatory Visit: Payer: Self-pay | Admitting: Family Medicine

## 2014-02-12 ENCOUNTER — Other Ambulatory Visit: Payer: Self-pay | Admitting: Family Medicine

## 2014-02-19 LAB — COMPLETE METABOLIC PANEL WITH GFR
ALT: 12 U/L (ref 0–35)
AST: 13 U/L (ref 0–37)
Albumin: 4 g/dL (ref 3.5–5.2)
Alkaline Phosphatase: 45 U/L (ref 39–117)
BUN: 11 mg/dL (ref 6–23)
CALCIUM: 9.8 mg/dL (ref 8.4–10.5)
CO2: 27 mEq/L (ref 19–32)
CREATININE: 0.81 mg/dL (ref 0.50–1.10)
Chloride: 104 mEq/L (ref 96–112)
GFR, Est African American: >89 mL/min
GFR, Est Non African American: 81 mL/min
Glucose, Bld: 107 mg/dL — ABNORMAL HIGH (ref 70–99)
POTASSIUM: 5 meq/L (ref 3.5–5.3)
Sodium: 140 mEq/L (ref 135–145)
TOTAL PROTEIN: 6.7 g/dL (ref 6.0–8.3)
Total Bilirubin: 0.3 mg/dL (ref 0.2–1.2)

## 2014-02-19 LAB — LIPID PANEL
Cholesterol: 140 mg/dL (ref 0–200)
HDL: 39 mg/dL — ABNORMAL LOW (ref >39)
LDL Cholesterol: 84 mg/dL (ref 0–99)
TRIGLYCERIDES: 87 mg/dL (ref <150)
Total CHOL/HDL Ratio: 3.6 Ratio
VLDL: 17 mg/dL (ref 0–40)

## 2014-02-19 LAB — HEMOGLOBIN A1C
Hgb A1c MFr Bld: 6.8 % — ABNORMAL HIGH (ref <5.7)
MEAN PLASMA GLUCOSE: 148 mg/dL — AB (ref <117)

## 2014-02-20 ENCOUNTER — Telehealth: Payer: Self-pay | Admitting: *Deleted

## 2014-02-20 NOTE — Telephone Encounter (Signed)
Rise Paganini called for pt stating their is a group called [facility] coming in the facility and they have been telling pt that she can live on her own, and pt is now packing her stuff in a bag every morning stating she is going to have surgery and per Rise Paganini pt is not having surgery. Pt's daughter does not want pt living on her own because she does not do good living on her own and she quits taking her medication. 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 The group people told pt that someone will take her to the grocery store help with medications and give her baths and will come to her home all the time, [facility] has been questioning pt on how much money she gets monthly. Rise Paganini stated that the group is very stuck on Sritha because they have been asking for social security, medicaid and picture ID. Rise Paganini stated she did not make any copies for the [facility] with that information. Rise Paganini is upset about this, Rise Paganini requested  Dr. Moshe Cipro will need to write a letter stating pt needs to live in the facility and can not live on her own. Please advise [phone]

## 2014-02-21 ENCOUNTER — Other Ambulatory Visit: Payer: Self-pay | Admitting: Family Medicine

## 2014-02-21 NOTE — Telephone Encounter (Signed)
Called and left message for caregiver to return call.

## 2014-03-16 ENCOUNTER — Other Ambulatory Visit: Payer: Self-pay

## 2014-03-27 NOTE — Telephone Encounter (Signed)
Will addrss at [facility] , needs psych eval re abn bejhjvior also, she is regularly followed by psych

## 2014-03-28 ENCOUNTER — Ambulatory Visit (INDEPENDENT_AMBULATORY_CARE_PROVIDER_SITE_OTHER): Payer: Medicaid Other | Admitting: Family Medicine

## 2014-03-28 ENCOUNTER — Encounter: Payer: Self-pay | Admitting: Family Medicine

## 2014-03-28 VITALS — BP 114/74 | HR 96 | Resp 16 | Ht 62.0 in | Wt 127.1 lb

## 2014-03-28 DIAGNOSIS — IMO0001 Reserved for inherently not codable concepts without codable children: Secondary | ICD-10-CM

## 2014-03-28 DIAGNOSIS — I10 Essential (primary) hypertension: Secondary | ICD-10-CM

## 2014-03-28 DIAGNOSIS — Z23 Encounter for immunization: Secondary | ICD-10-CM

## 2014-03-28 DIAGNOSIS — E119 Type 2 diabetes mellitus without complications: Secondary | ICD-10-CM

## 2014-03-28 DIAGNOSIS — K219 Gastro-esophageal reflux disease without esophagitis: Secondary | ICD-10-CM

## 2014-03-28 DIAGNOSIS — E785 Hyperlipidemia, unspecified: Secondary | ICD-10-CM

## 2014-03-28 DIAGNOSIS — J309 Allergic rhinitis, unspecified: Secondary | ICD-10-CM

## 2014-03-28 DIAGNOSIS — Z1159 Encounter for screening for other viral diseases: Secondary | ICD-10-CM

## 2014-03-28 DIAGNOSIS — F209 Schizophrenia, unspecified: Secondary | ICD-10-CM

## 2014-03-28 DIAGNOSIS — E559 Vitamin D deficiency, unspecified: Secondary | ICD-10-CM

## 2014-03-28 NOTE — Patient Instructions (Signed)
F/u in 3.5 month, call if you need me  before  Prevnar today  Non fasting HIV, chem 7 and EGFR, hBA1C, vit D in 4 month  Because of your multiple , and complex medical conditions, I recommend continued living in a supervised environment, you are doing very well , and you report that you are well cared for

## 2014-03-29 LAB — MICROALBUMIN / CREATININE URINE RATIO
CREATININE, URINE: 39.4 mg/dL
Microalb, Ur: <0.2 mg/dL (ref <2.0)

## 2014-04-01 DIAGNOSIS — Z23 Encounter for immunization: Secondary | ICD-10-CM | POA: Insufficient documentation

## 2014-04-01 HISTORY — DX: Encounter for immunization: Z23

## 2014-04-01 NOTE — Assessment & Plan Note (Signed)
Controlled, no change in medication  

## 2014-04-01 NOTE — Assessment & Plan Note (Signed)
controlled and managed by psychiatry, pt needs  To remain in an assisted living facility, she is incapable of adequately caring for herself on her own, i have told her this

## 2014-04-01 NOTE — Progress Notes (Signed)
   Subjective:    Patient ID: Wendy Oneill, female    DOB: 1955/04/30, 59 y.o.   MRN: [id number]  HPI The PT is here for follow up and re-evaluation of chronic medical conditions, medication management and review of any available recent lab and radiology data.  Preventive health is updated, specifically  Cancer screening and Immunization.   Questions or concerns regarding consultations or procedures which the PT has had in the interim are  addressed. The PT denies any adverse reactions to current medications since the last visit.  Denies polyuria, polydipsia, blurred vision , or hypoglycemic episodes. Has questions/concerns as to her ability to live independently, per er caregiver, she has been recently encouraged to do so. Wendy Oneill has multiple medical conditions which include schizophrenia, her ability to be responsible for her care while living on her own is not present      Review of Systems See HPI Denies recent fever or chills. Denies sinus pressure, nasal congestion, ear pain or sore throat. Denies chest congestion, productive cough or wheezing. Denies chest pains, palpitations and leg swelling Denies abdominal pain, nausea, vomiting,diarrhea or constipation.   Denies dysuria, frequency, hesitancy or incontinence. Denies joint pain, swelling and limitation in mobility. Denies headaches, seizures, numbness, or tingling. Denies depression, anxiety or insomnia. Denies skin break down or rash.        Objective:   Physical Exam BP 114/74 mmHg  Pulse 96  Resp 16  Ht 5\' 2"  (1.575 m)  Wt 127 lb 1.9 oz (57.661 kg)  BMI 23.24 kg/m2  SpO2 95%  LMP 07/06/2012 Patient alert and oriented and in no cardiopulmonary distress.  HEENT: No facial asymmetry, EOMI,   oropharynx pink and moist.  Neck supple no JVD, no mass.  Chest: Clear to auscultation bilaterally.  CVS: S1, S2 no murmurs, no S3.Regular rate.  ABD: Soft non tender.   Ext: No edema  MS: Adequate ROM spine,  shoulders, hips and knees.  Skin: Intact, no ulcerations or rash noted.  Psych: Good eye contact, normal affect. Memory intact not anxious or depressed appearing.  CNS: CN 2-12 intact, power,  normal throughout.no focal deficits noted.        Assessment & Plan:  HTN, goal below 130/80 Controlled, no change in medication    Allergic rhinitis Controlled, no change in medication    Gastro-esophageal reflux Controlled, no change in medication    Hyperlipidemia LDL goal <100 Controlled, no change in medication Hyperlipidemia:Low fat diet discussed and encouraged.     Schizophrenia, unspecified type controlled and managed by psychiatry, pt needs  To remain in an assisted living facility, she is incapable of adequately caring for herself on her own, i have told her this   Need for vaccination with 13-polyvalent pneumococcal conjugate vaccine After obtaining informed consent, the vaccine is  administered by LPN

## 2014-04-01 NOTE — Assessment & Plan Note (Signed)
After obtaining informed consent, the vaccine is  administered by LPN.  

## 2014-04-01 NOTE — Assessment & Plan Note (Signed)
Controlled, no change in medication Hyperlipidemia:Low fat diet discussed and encouraged.  \ 

## 2014-04-24 ENCOUNTER — Other Ambulatory Visit: Payer: Self-pay | Admitting: Family Medicine

## 2014-05-24 ENCOUNTER — Other Ambulatory Visit: Payer: Self-pay | Admitting: Family Medicine

## 2014-06-19 ENCOUNTER — Other Ambulatory Visit: Payer: Self-pay | Admitting: Family Medicine

## 2014-07-25 ENCOUNTER — Encounter: Payer: Self-pay | Admitting: Gastroenterology

## 2014-07-26 LAB — BASIC METABOLIC PANEL WITH GFR
BUN: 14 mg/dL (ref 6–23)
CHLORIDE: 104 meq/L (ref 96–112)
CO2: 25 mEq/L (ref 19–32)
CREATININE: 0.67 mg/dL (ref 0.50–1.10)
Calcium: 9.6 mg/dL (ref 8.4–10.5)
GFR, Est African American: >89 mL/min
Glucose, Bld: 96 mg/dL (ref 70–99)
POTASSIUM: 3.9 meq/L (ref 3.5–5.3)
Sodium: 140 mEq/L (ref 135–145)

## 2014-07-26 LAB — HEMOGLOBIN A1C
Hgb A1c MFr Bld: 7 % — ABNORMAL HIGH (ref <5.7)
MEAN PLASMA GLUCOSE: 154 mg/dL — AB (ref <117)

## 2014-07-26 LAB — HIV ANTIBODY (ROUTINE TESTING W REFLEX): HIV 1&2 Ab, 4th Generation: NONREACTIVE

## 2014-07-26 LAB — VITAMIN D 25 HYDROXY (VIT D DEFICIENCY, FRACTURES): VIT D 25 HYDROXY: 34 ng/mL (ref 30–100)

## 2014-08-01 ENCOUNTER — Ambulatory Visit (INDEPENDENT_AMBULATORY_CARE_PROVIDER_SITE_OTHER): Payer: Medicaid Other | Admitting: Family Medicine

## 2014-08-01 ENCOUNTER — Encounter: Payer: Self-pay | Admitting: Family Medicine

## 2014-08-01 VITALS — BP 116/76 | HR 86 | Resp 18 | Ht 66.0 in | Wt 123.0 lb

## 2014-08-01 DIAGNOSIS — I10 Essential (primary) hypertension: Secondary | ICD-10-CM

## 2014-08-01 DIAGNOSIS — K219 Gastro-esophageal reflux disease without esophagitis: Secondary | ICD-10-CM

## 2014-08-01 DIAGNOSIS — E119 Type 2 diabetes mellitus without complications: Secondary | ICD-10-CM

## 2014-08-01 DIAGNOSIS — E785 Hyperlipidemia, unspecified: Secondary | ICD-10-CM | POA: Diagnosis not present

## 2014-08-01 DIAGNOSIS — J3089 Other allergic rhinitis: Secondary | ICD-10-CM | POA: Diagnosis not present

## 2014-08-01 DIAGNOSIS — IMO0001 Reserved for inherently not codable concepts without codable children: Secondary | ICD-10-CM

## 2014-08-01 DIAGNOSIS — F209 Schizophrenia, unspecified: Secondary | ICD-10-CM

## 2014-08-01 MED ORDER — FLUTICASONE PROPIONATE 50 MCG/ACT NA SUSP: 2.0000 | Freq: Every day | NASAL | Status: DC

## 2014-08-01 NOTE — Patient Instructions (Signed)
F/u with rectal in Novemebr, call if you need me before  Use flonase and claritin daily for allergies  Increase food intake a bit, you are losing weight   Thanks for choosing [facility], we consider it a privelige to serve you.  Fasting lipid, cmp  and EGFr, HBA1C and tsh in Novemebr  Thanks for choosing [facility], we consider it a privelige to serve you.

## 2014-08-17 ENCOUNTER — Other Ambulatory Visit: Payer: Self-pay | Admitting: Family Medicine

## 2014-08-18 NOTE — Assessment & Plan Note (Signed)
Controlled, no change in medication Hyperlipidemia:Low fat diet discussed and encouraged.   Lipid Panel  Lab Results  Component Value Date   CHOL 140 02/19/2014   HDL 39* 02/19/2014   LDLCALC 84 02/19/2014   TRIG 87 02/19/2014   CHOLHDL 3.6 02/19/2014

## 2014-08-18 NOTE — Progress Notes (Signed)
Subjective:    Patient ID: Wendy Oneill, female    DOB: 12/23/1955, 59 y.o.   MRN: [id number]  HPI   Wendy Oneill     MRN: [id number]      DOB: 1955/07/10   HPI Wendy Oneill is here for follow up and re-evaluation of chronic medical conditions, medication management and review of any available recent lab and radiology data.  Preventive health is updated, specifically  Cancer screening and Immunization.   Questions or concerns regarding consultations or procedures which the PT has had in the interim are  addressed. The PT denies any adverse reactions to current medications since the last visit.  Denies polyuria, polydipsia, blurred vision , or hypoglycemic episodes. C/o increased nasal drainage and blurred vision for past week ROS Denies recent fever or chills. Denies sinus pressure, nasal congestion, ear pain or sore throat. Denies chest congestion, productive cough or wheezing. Denies chest pains, palpitations and leg swelling Denies abdominal pain, nausea, vomiting,diarrhea or constipation.   Denies dysuria, frequency, hesitancy or incontinence. Denies joint pain, swelling and limitation in mobility. Denies headaches, seizures, numbness, or tingling. Denies depression, anxiety or insomnia. Denies skin break down or rash.   PE  BP 116/76 mmHg  Pulse 86  Resp 18  Ht 5\' 6"  (1.676 m)  Wt 123 lb (55.792 kg)  BMI 19.86 kg/m2  SpO2 99%  LMP 07/06/2012  Patient alert and oriented and in no cardiopulmonary distress.  HEENT: No facial asymmetry, EOMI,   oropharynx pink and moist.  Neck supple no JVD, no mass.  Chest: Clear to auscultation bilaterally.  CVS: S1, S2 no murmurs, no S3.Regular rate.  ABD: Soft non tender.   Ext: No edema  MS: Adequate ROM spine, shoulders, hips and knees.  Skin: Intact, no ulcerations or rash noted.  Psych: Good eye contact, normal affect. Memory intact not anxious or depressed appearing.  CNS: CN 2-12 intact, power,  normal throughout.no  focal deficits noted.   Assessment & Plan   HTN, goal below 130/80 Controlled, no change in medication DASH diet and commitment to daily physical activity for a minimum of 30 minutes discussed and encouraged, as a part of hypertension management. The importance of attaining a healthy weight is also discussed.  BP/Weight 08/01/2014 03/28/2014 10/26/2013 09/13/2013 06/06/2013 05/16/2013 07/18/5282  Systolic BP 132 440 102 725 366 440 347  Diastolic BP 76 74 84 74 78 84 63  Wt. (Lbs) 123 127.12 129.4 128.8 131 134 134  BMI 19.86 23.24 23.66 23.55 23.95 24.5 24.5        Allergic rhinitis Increased symptoms, needs to commit to daily use of prescription meds  COLONIC POLYPS, ADENOMATOUS, HX OF Next colonoscopy due in 2020  Schizophrenia, unspecified type Controlled, no change in medication   Hyperlipidemia LDL goal <100 Controlled, no change in medication Hyperlipidemia:Low fat diet discussed and encouraged.   Lipid Panel  Lab Results  Component Value Date   CHOL 140 02/19/2014   HDL 39* 02/19/2014   LDLCALC 84 02/19/2014   TRIG 87 02/19/2014   CHOLHDL 3.6 02/19/2014         Non-insulin-dependent diabetes mellitus without complications Controlled, no change in medication Wendy Oneill is reminded of the importance of commitment to daily physical activity for 30 minutes or more, as able and the need to limit carbohydrate intake to 30 to 60 grams per meal to help with blood sugar control.   The need to take medication as prescribed, test blood sugar as directed, and  to call between visits if there is a concern that blood sugar is uncontrolled is also discussed.   Wendy Oneill is reminded of the importance of daily foot exam, annual eye examination, and good blood sugar, blood pressure and cholesterol control.  Diabetic Labs Latest Ref Rng 07/25/2014 03/28/2014 02/19/2014 10/26/2013 07/20/2013  HbA1c <5.7 % 7.0(H) - 6.8(H) 6.9(H) 6.4(H)  Microalbumin <2.0 mg/dL - <0.2 - - -  Micro/Creat  Ratio 0.0 - 30.0 mg/g - SEE NOTE - - -  Chol 0 - 200 mg/dL - - 140 - 137  HDL >39 mg/dL - - 39(L) - 47  Calc LDL 0 - 99 mg/dL - - 84 - 76  Triglycerides <150 mg/dL - - 87 - 72  Creatinine 0.50 - 1.10 mg/dL 0.67 - 0.81 0.73 0.83   BP/Weight 08/01/2014 03/28/2014 10/26/2013 09/13/2013 06/06/2013 05/16/2013 3/36/1224  Systolic BP 497 530 051 102 111 735 670  Diastolic BP 76 74 84 74 78 84 63  Wt. (Lbs) 123 127.12 129.4 128.8 131 134 134  BMI 19.86 23.24 23.66 23.55 23.95 24.5 24.5   Foot/eye exam completion dates Latest Ref Rng 10/27/2013 10/26/2013  Eye Exam No Retinopathy No Retinopathy -  Foot exam Order - - -  Foot Form Completion - - Done         Gastro-esophageal reflux Controlled, no change in medication       Review of Systems     Objective:   Physical Exam        Assessment & Plan:

## 2014-08-18 NOTE — Assessment & Plan Note (Signed)
Controlled, no change in medication  

## 2014-08-18 NOTE — Assessment & Plan Note (Signed)
Controlled, no change in medication DASH diet and commitment to daily physical activity for a minimum of 30 minutes discussed and encouraged, as a part of hypertension management. The importance of attaining a healthy weight is also discussed.  BP/Weight 08/01/2014 03/28/2014 10/26/2013 09/13/2013 06/06/2013 05/16/2013 0/09/3816  Systolic BP 299 371 696 789 381 017 510  Diastolic BP 76 74 84 74 78 84 63  Wt. (Lbs) 123 127.12 129.4 128.8 131 134 134  BMI 19.86 23.24 23.66 23.55 23.95 24.5 24.5

## 2014-08-18 NOTE — Assessment & Plan Note (Signed)
Next colonoscopy due in 2020

## 2014-08-18 NOTE — Assessment & Plan Note (Signed)
Controlled, no change in medication Wendy Oneill is reminded of the importance of commitment to daily physical activity for 30 minutes or more, as able and the need to limit carbohydrate intake to 30 to 60 grams per meal to help with blood sugar control.   The need to take medication as prescribed, test blood sugar as directed, and to call between visits if there is a concern that blood sugar is uncontrolled is also discussed.   Wendy Oneill is reminded of the importance of daily foot exam, annual eye examination, and good blood sugar, blood pressure and cholesterol control.  Diabetic Labs Latest Ref Rng 07/25/2014 03/28/2014 02/19/2014 10/26/2013 07/20/2013  HbA1c <5.7 % 7.0(H) - 6.8(H) 6.9(H) 6.4(H)  Microalbumin <2.0 mg/dL - <0.2 - - -  Micro/Creat Ratio 0.0 - 30.0 mg/g - SEE NOTE - - -  Chol 0 - 200 mg/dL - - 140 - 137  HDL >39 mg/dL - - 39(L) - 47  Calc LDL 0 - 99 mg/dL - - 84 - 76  Triglycerides <150 mg/dL - - 87 - 72  Creatinine 0.50 - 1.10 mg/dL 0.67 - 0.81 0.73 0.83   BP/Weight 08/01/2014 03/28/2014 10/26/2013 09/13/2013 06/06/2013 05/16/2013 7/78/2423  Systolic BP 536 144 315 400 867 619 509  Diastolic BP 76 74 84 74 78 84 63  Wt. (Lbs) 123 127.12 129.4 128.8 131 134 134  BMI 19.86 23.24 23.66 23.55 23.95 24.5 24.5   Foot/eye exam completion dates Latest Ref Rng 10/27/2013 10/26/2013  Eye Exam No Retinopathy No Retinopathy -  Foot exam Order - - -  Foot Form Completion - - Done

## 2014-08-18 NOTE — Assessment & Plan Note (Signed)
Increased symptoms, needs to commit to daily use of prescription meds

## 2014-08-20 ENCOUNTER — Other Ambulatory Visit: Payer: Self-pay | Admitting: Family Medicine

## 2014-09-18 ENCOUNTER — Other Ambulatory Visit: Payer: Self-pay | Admitting: Family Medicine

## 2014-09-28 ENCOUNTER — Other Ambulatory Visit: Payer: Self-pay

## 2014-10-17 ENCOUNTER — Other Ambulatory Visit: Payer: Self-pay | Admitting: Family Medicine

## 2014-11-19 ENCOUNTER — Other Ambulatory Visit: Payer: Self-pay | Admitting: Family Medicine

## 2014-11-19 DIAGNOSIS — Z1231 Encounter for screening mammogram for malignant neoplasm of breast: Secondary | ICD-10-CM

## 2014-11-20 ENCOUNTER — Other Ambulatory Visit: Payer: Self-pay | Admitting: Family Medicine

## 2014-11-20 ENCOUNTER — Encounter: Payer: Self-pay | Admitting: Gastroenterology

## 2014-11-20 LAB — HEMOGLOBIN A1C
Hgb A1c MFr Bld: 6.8 % — ABNORMAL HIGH (ref <5.7)
Mean Plasma Glucose: 148 mg/dL — ABNORMAL HIGH (ref <117)

## 2014-11-21 LAB — COMPLETE METABOLIC PANEL WITH GFR
ALT: 17 U/L (ref 6–29)
AST: 18 U/L (ref 10–35)
Albumin: 4 g/dL (ref 3.6–5.1)
Alkaline Phosphatase: 45 U/L (ref 33–130)
BUN: 9 mg/dL (ref 7–25)
CHLORIDE: 102 mmol/L (ref 98–110)
CO2: 26 mmol/L (ref 20–31)
Calcium: 9.7 mg/dL (ref 8.6–10.4)
Creat: 0.71 mg/dL (ref 0.50–1.05)
GFR, Est African American: >89 mL/min (ref >=60)
GLUCOSE: 92 mg/dL (ref 65–99)
POTASSIUM: 4.4 mmol/L (ref 3.5–5.3)
Sodium: 140 mmol/L (ref 135–146)
Total Bilirubin: 0.3 mg/dL (ref 0.2–1.2)
Total Protein: 7.1 g/dL (ref 6.1–8.1)

## 2014-11-21 LAB — LIPID PANEL
Cholesterol: 145 mg/dL (ref 125–200)
HDL: 52 mg/dL (ref >=46)
LDL Cholesterol: 78 mg/dL (ref <130)
Total CHOL/HDL Ratio: 2.8 Ratio (ref <=5.0)
Triglycerides: 76 mg/dL (ref <150)
VLDL: 15 mg/dL (ref <30)

## 2014-11-21 LAB — TSH: TSH: 1.134 u[IU]/mL (ref 0.350–4.500)

## 2014-11-22 ENCOUNTER — Ambulatory Visit (HOSPITAL_COMMUNITY): Payer: Medicaid Other

## 2014-11-28 ENCOUNTER — Ambulatory Visit: Payer: Medicaid Other | Admitting: Family Medicine

## 2014-12-19 ENCOUNTER — Encounter: Payer: Self-pay | Admitting: Gastroenterology

## 2014-12-19 ENCOUNTER — Ambulatory Visit (INDEPENDENT_AMBULATORY_CARE_PROVIDER_SITE_OTHER): Payer: Medicaid Other | Admitting: Gastroenterology

## 2014-12-19 ENCOUNTER — Ambulatory Visit (HOSPITAL_COMMUNITY)
Admission: RE | Admit: 2014-12-19 | Discharge: 2014-12-19 | Disposition: A | Discharge status: Home / Self Care | Payer: Medicaid Other | Source: Ambulatory Visit | Attending: Family Medicine | Admitting: Family Medicine

## 2014-12-19 ENCOUNTER — Encounter: Payer: Self-pay | Admitting: Family Medicine

## 2014-12-19 ENCOUNTER — Ambulatory Visit (INDEPENDENT_AMBULATORY_CARE_PROVIDER_SITE_OTHER): Payer: Medicaid Other | Admitting: Family Medicine

## 2014-12-19 VITALS — BP 91/63 | HR 128 | Temp 97.6°F | Ht 62.0 in | Wt 120.8 lb

## 2014-12-19 VITALS — BP 100/60 | HR 100 | Temp 98.9°F | Resp 18 | Ht 66.0 in | Wt 121.0 lb

## 2014-12-19 DIAGNOSIS — E119 Type 2 diabetes mellitus without complications: Secondary | ICD-10-CM

## 2014-12-19 DIAGNOSIS — J209 Acute bronchitis, unspecified: Secondary | ICD-10-CM

## 2014-12-19 DIAGNOSIS — R0989 Other specified symptoms and signs involving the circulatory and respiratory systems: Secondary | ICD-10-CM | POA: Diagnosis not present

## 2014-12-19 DIAGNOSIS — F209 Schizophrenia, unspecified: Secondary | ICD-10-CM

## 2014-12-19 DIAGNOSIS — E785 Hyperlipidemia, unspecified: Secondary | ICD-10-CM | POA: Diagnosis not present

## 2014-12-19 DIAGNOSIS — R05 Cough: Secondary | ICD-10-CM | POA: Insufficient documentation

## 2014-12-19 DIAGNOSIS — K219 Gastro-esophageal reflux disease without esophagitis: Secondary | ICD-10-CM

## 2014-12-19 DIAGNOSIS — Z1159 Encounter for screening for other viral diseases: Secondary | ICD-10-CM

## 2014-12-19 DIAGNOSIS — R059 Cough, unspecified: Secondary | ICD-10-CM

## 2014-12-19 DIAGNOSIS — I10 Essential (primary) hypertension: Secondary | ICD-10-CM

## 2014-12-19 DIAGNOSIS — Z87891 Personal history of nicotine dependence: Secondary | ICD-10-CM | POA: Insufficient documentation

## 2014-12-19 DIAGNOSIS — J018 Other acute sinusitis: Secondary | ICD-10-CM

## 2014-12-19 MED ORDER — PENICILLIN V POTASSIUM 500 MG PO TABS: 500.0000 mg | ORAL_TABLET | Freq: Three times a day (TID) | ORAL | Status: DC

## 2014-12-19 MED ORDER — OMEPRAZOLE 20 MG PO CPDR: DELAYED_RELEASE_CAPSULE | ORAL | Status: DC

## 2014-12-19 MED ORDER — IPRATROPIUM BROMIDE 0.02 % IN SOLN
0.5000 mg | Freq: Once | RESPIRATORY_TRACT | Status: AC
  Administered 2014-12-19: 0.5 mg via RESPIRATORY_TRACT

## 2014-12-19 MED ORDER — BENZONATATE 100 MG PO CAPS: 100.0000 mg | ORAL_CAPSULE | Freq: Three times a day (TID) | ORAL | Status: DC

## 2014-12-19 MED ORDER — ALBUTEROL SULFATE (2.5 MG/3ML) 0.083% IN NEBU
2.5000 mg | INHALATION_SOLUTION | Freq: Once | RESPIRATORY_TRACT | Status: AC
  Administered 2014-12-19: 2.5 mg via RESPIRATORY_TRACT

## 2014-12-19 MED ORDER — CEFTRIAXONE SODIUM 500 MG IJ SOLR
500.0000 mg | Freq: Once | INTRAMUSCULAR | Status: AC
  Administered 2014-12-19: 500 mg via INTRAMUSCULAR

## 2014-12-19 MED ORDER — PROMETHAZINE-DM 6.25-15 MG/5ML PO SYRP: ORAL_SOLUTION | ORAL | Status: DC

## 2014-12-19 NOTE — Assessment & Plan Note (Signed)
SYMPTOMS FAIRLY WELL CONTROLLED.  CONTINUE OMEPRAZOLE.  TAKE 30 MINUTES PRIOR TO YOUR FIRST MEAL. AVOID TRIGGERS ZANTAC PRN FOLLOW UP IN 6 MOS.

## 2014-12-19 NOTE — Patient Instructions (Signed)
F/u March 15 or after, call if you need me sooner  Excellent labs from recent blood work  You have acute sinusitis and bronchitis and medication is given and sent for this.  PLEASE GET CXR TODAY GO TO HOSPITAL WHEN YOU LEAVE HERE  RETURN IN 3 WEEKS FOR FLU VACCINE  Labs with urine March 10 or after  Thanks for choosing [facility], we consider it a privelige to serve you.  All the best for 2017!

## 2014-12-19 NOTE — Patient Instructions (Addendum)
TAKE OMEPRAZOLE ONCE DAILY 30 MINS PRIOR TO BREAKFAST FOREVER TO CONTROL HEARTBUIRN.  ZANTAC HELPS MOST WHEN USED AS NEEDED FOR UNCONTROLLED HEARTBURN. YOU CAN USE IT TWICE DAILY.  FOLLOW UP IN 6 MOS. MERRY CHRISTMAS AND HAPPY NEW YEAR!

## 2014-12-19 NOTE — Assessment & Plan Note (Signed)
10 day h/o worsening symptoms with chills and green sputum, Rocephin, followed by penicillin

## 2014-12-19 NOTE — Progress Notes (Signed)
Subjective:    Patient ID: Wendy Oneill, female    DOB: [birth year], 57 y.o.   MRN: [id number]  HPI 1 week h/o worsening head and chest congestion, associated with fever and chills intermittently. Nasal drainage has thickened , and is yellowish green, and at times bloody. Sputum is thick and yellow.c/o chest and upper abdominal pain wih cough C/o bilateral ear pressure, denies hearing loss and sore throat. Increasing fatigue , poor appetitie and sleep disturbed by cough. No improvement with OTC medication. Recent labs are also reviewed Denies polyuria, polydipsia, blurred vision , or hypoglycemic episodes.    Review of Systems See HPI Denies , palpitations and leg swelling Denies abdominal pain, nausea, vomiting,diarrhea or constipation.   Denies dysuria, frequency, hesitancy or incontinence. Denies joint pain, swelling and limitation in mobility. Denies headaches, seizures, numbness, or tingling. Denies depression, anxiety or insomnia. Denies skin break down or rash.        Objective:   Physical Exam BP 100/60 mmHg  Pulse 100  Temp(Src) 98.9 F (37.2 C)  Resp 18  Ht 5\' 6"  (1.676 m)  Wt 121 lb (54.885 kg)  BMI 19.54 kg/m2  SpO2 95%  LMP 07/06/2012 Patient alert and oriented and in mild  cardiopulmonary distress.  HEENT: No facial asymmetry, EOMI,   oropharynx pink and moist.  Neck supple no JVD, no mass. Maxillary sinus tenderness, TM clear, anterior cervical lymphadenitis Chest: decreased air entry , bilateral crackles and wheezes.  CVS: S1, S2 no murmurs, no S3.Regular rate.  ABD: Soft non tender.   Ext: No edema  MS: Adequate ROM spine, shoulders, hips and knees.  Skin: Intact, no ulcerations or rash noted.  Psych: Good eye contact, normal affect. Memory intact not anxious or depressed appearing.  CNS: CN 2-12 intact, power,  normal throughout.no focal deficits noted.        Assessment & Plan:  Acute bronchitis 10 day h/o worsening symptoms with  chills and green sputum, Rocephin, followed by penicillin   duoneb and rocephin in office , followed  By pen V , decongestant and cough suppressant  Acute sinusitis Antibiotic prescribed  Non-insulin-dependent diabetes mellitus without complications Controlled, no change in medication [name] is reminded of the importance of commitment to daily physical activity for 30 minutes or more, as able and the need to limit carbohydrate intake to 30 to 60 grams per meal to help with blood sugar control.   The need to take medication as prescribed, test blood sugar as directed, and to call between visits if there is a concern that blood sugar is uncontrolled is also discussed.   [name] is reminded of the importance of daily foot exam, annual eye examination, and good blood sugar, blood pressure and cholesterol control.  Diabetic Labs Latest Ref Rng 11/20/2014 07/25/2014 03/28/2014 02/19/2014 10/26/2013  HbA1c <5.7 % 6.8(H) 7.0(H) - 6.8(H) 6.9(H)  Microalbumin <2.0 mg/dL - - <0.2 - -  Micro/Creat Ratio 0.0 - 30.0 mg/g - - SEE NOTE - -  Chol 125 - 200 mg/dL 145 - - 140 -  HDL >=46 mg/dL 52 - - 39(L) -  Calc LDL <130 mg/dL 78 - - 84 -  Triglycerides <150 mg/dL 76 - - 87 -  Creatinine 0.50 - 1.05 mg/dL 0.71 0.67 - 0.81 0.73   BP/Weight 12/19/2014 12/19/2014 08/01/2014 03/28/2014 10/26/2013 09/13/2013 123456  Systolic BP 123XX123 91 [id number] 0000000 123XX123 [id number]  Diastolic BP 60 63 76 74 84 74 78  Wt. (Lbs) 121 120.8 123  127.12 129.4 128.8 131  BMI 19.54 22.09 19.86 23.24 23.66 23.55 23.95   Foot/eye exam completion dates Latest Ref Rng 12/19/2014 10/27/2013  Eye Exam No Retinopathy - No Retinopathy  Foot exam Order - - -  Foot Form Completion - Done -         Hyperlipidemia LDL goal <100 Hyperlipidemia:Low fat diet discussed and encouraged.   Lipid Panel  Lab Results  Component Value Date   CHOL 145 11/20/2014   HDL 52 11/20/2014   LDLCALC 78 11/20/2014   TRIG 76 11/20/2014   CHOLHDL 2.8  11/20/2014   Controlled, no change in medication      Schizophrenia, unspecified type Stable, treated by psychiatry

## 2014-12-19 NOTE — Progress Notes (Signed)
Subjective:    Patient ID: Wendy Oneill, female    DOB: 1955-09-26, 59 y.o.   MRN: [id number]  Tula Nakayama, MD  HPI  [facility] EVERY DAY AND CAN BE 2-3 TIMES A DAY. STOPPED DRINKING COFFEE DUE TO PROBLEMS WITH GAS. CONSTIPATION/HEARTBURN: RARE. NAUSEA: RARE. JUST LEFT DR. Moshe Cipro AND HAS BRONCHITIS AND SINUS TROUBLE. WEIGHT DOWN 8 LBS SINCE AUG 2015.  PT DENIES FEVER, CHILLS, HEMATOCHEZIA, HEMATEMESIS, vomiting, melena, diarrhea, CHEST PAIN, SHORTNESS OF BREATH,  CHANGE IN BOWEL IN HABITS, abdominal pain, OR problems swallowing.  Past Medical History  Diagnosis Date  . Allergic rhinitis   . Personal history of schizophrenia   . Diabetes mellitus, type 1   . Hyperlipidemia   . Adenoma May 2010    Simple : TCS   . GERD (gastroesophageal reflux disease)    Past Surgical History  Procedure Laterality Date  . Multiple tooth extractions  Feb 03, 2010  . Colonoscopy  MAY 2010    SIMPLE ADENOMAS   Allergies  Allergen Reactions  . Risperidone   . Sulfonamide Derivatives     Current Outpatient Prescriptions  Medication Sig Dispense Refill  . ASPIRIN LOW DOSE 81 MG EC tablet TAKE (1) TABLET BY MOUTH EACH MORNING. 30 tablet 11  . benzonatate (TESSALON) 100 MG capsule Take 1 capsule (100 mg total) by mouth 3 (three) times daily. 21 capsule 0  . calcium-vitamin D (OSCAL WITH D) 500-200 MG-UNIT per tablet TAKE (1) TABLET BY MOUTH (3) TIMES DAILY. 90 tablet 5  . clotrimazole-betamethasone (LOTRISONE) cream APPLY TO AFFECTED AREA(S) OF SKIN TWICE DAILY AS NEEDED FOR RASH OR IRRITATION. 45 g 2  . COGENTIN 0.5 MG tablet TAKE (1) TABLET BY MOUTH TWICE DAILY. 60 each 2  . escitalopram (LEXAPRO) 10 MG tablet TAKE (1) TABLET BY MOUTH EACH MORNING. 30 tablet 0  . ferrous sulfate 325 (65 FE) MG tablet TAKE 1 TABLET BY MOUTH ONCE DAILY. 30 tablet 11  . fluticasone (FLONASE) 50 MCG/ACT nasal spray Place 2 sprays into both nostrils daily. 16 g 6  . lisinopril (PRINIVIL,ZESTRIL) 5 MG tablet TAKE 1  TABLET BY MOUTH ONCE DAILY. 30 tablet 3  . loratadine (CLARITIN) 10 MG tablet TAKE ONE TABLET BY MOUTH ONCE DAILY. 30 tablet 11  . lovastatin (MEVACOR) 20 MG tablet TAKE 1 TABLET BY MOUTH AT BEDTIME. 30 tablet 6  . metFORMIN (GLUCOPHAGE-XR) 500 MG 24 hr tablet TAKE 4 TABLETS (2000mg ) BY MOUTH WITH BREAKFAST. 120 tablet 6  . omeprazole (PRILOSEC) 20 MG capsule TAKE 1 CAPSULE BY MOUTH ONCE DAILY 30 MINUTES BEFORE FIRST MEAL. 30 capsule 3  . penicillin v potassium (VEETID) 500 MG tablet Take 1 tablet (500 mg total) by mouth 3 (three) times daily. 30 tablet 0  . promethazine-dextromethorphan (PROMETHAZINE-DM) 6.25-15 MG/5ML syrup One teaspoon at bedtime for excess cough and congestion 118 mL 0  . Q-PAP 500 MG tablet TAKE 1 TABLET BY MOUTH ONCE DAILY AS NEEDED FOR PAIN. 30 tablet 4  . ranitidine (ZANTAC) 150 MG tablet TAKE 1 TABLET BY MOUTH TWICE DAILY. 60 tablet 5  . risperiDONE (RISPERDAL) 2 MG tablet Take 2 mg by mouth 3 (three) times daily. Take one tablet by mouth in the AM and two tablets in the PM    . STIMULANT LAXATIVE 5 MG EC tablet TAKE 1 TABLET BY MOUTH ONCE DAILY AS NEEDED FOR CONSTIPATION. 30 tablet 6  . STOOL SOFTENER 100 MG capsule TAKE (1) CAPSULE BY MOUTH TWICE DAILY. 60 capsule 11  Review of Systems PER HPI OTHERWISE ALL SYSTEMS ARE NEGATIVE.    Objective:   Physical Exam  Constitutional: She is oriented to person, place, and time. She appears well-developed and well-nourished. No distress.  HENT:  Head: Normocephalic and atraumatic.  Mouth/Throat: Oropharynx is clear and moist. No oropharyngeal exudate.  Eyes: Pupils are equal, round, and reactive to light. No scleral icterus.  Neck: Normal range of motion. Neck supple.  Cardiovascular: Normal rate, regular rhythm and normal heart sounds.   Pulmonary/Chest: Effort normal and breath sounds normal. No respiratory distress.  Abdominal: Soft. Bowel sounds are normal. She exhibits no distension. There is tenderness. There is no  rebound and no guarding.  MILD TTP IN THE EPIGASTRIUM & PERIUMBILICAL REGION  Musculoskeletal: She exhibits no edema.  Lymphadenopathy:    She has no cervical adenopathy.  Neurological: She is alert and oriented to person, place, and time.  Psychiatric: She has a normal mood and affect.  Vitals reviewed.     Assessment & Plan:

## 2014-12-20 NOTE — Progress Notes (Signed)
CC'D TO PCP °

## 2014-12-20 NOTE — Progress Notes (Signed)
ON RECALL  °

## 2014-12-24 DIAGNOSIS — J209 Acute bronchitis, unspecified: Secondary | ICD-10-CM | POA: Insufficient documentation

## 2014-12-24 DIAGNOSIS — J019 Acute sinusitis, unspecified: Secondary | ICD-10-CM | POA: Insufficient documentation

## 2014-12-24 NOTE — Assessment & Plan Note (Signed)
Hyperlipidemia:Low fat diet discussed and encouraged.   Lipid Panel  Lab Results  Component Value Date   CHOL 145 11/20/2014   HDL 52 11/20/2014   LDLCALC 78 11/20/2014   TRIG 76 11/20/2014   CHOLHDL 2.8 11/20/2014   Controlled, no change in medication

## 2014-12-24 NOTE — Assessment & Plan Note (Signed)
duoneb and rocephin in office , followed  By pen V , decongestant and cough suppressant

## 2014-12-24 NOTE — Assessment & Plan Note (Signed)
Controlled, no change in medication Wendy Oneill is reminded of the importance of commitment to daily physical activity for 30 minutes or more, as able and the need to limit carbohydrate intake to 30 to 60 grams per meal to help with blood sugar control.   The need to take medication as prescribed, test blood sugar as directed, and to call between visits if there is a concern that blood sugar is uncontrolled is also discussed.   Wendy Oneill is reminded of the importance of daily foot exam, annual eye examination, and good blood sugar, blood pressure and cholesterol control.  Diabetic Labs Latest Ref Rng 11/20/2014 07/25/2014 03/28/2014 02/19/2014 10/26/2013  HbA1c <5.7 % 6.8(H) 7.0(H) - 6.8(H) 6.9(H)  Microalbumin <2.0 mg/dL - - <0.2 - -  Micro/Creat Ratio 0.0 - 30.0 mg/g - - SEE NOTE - -  Chol 125 - 200 mg/dL 145 - - 140 -  HDL >=46 mg/dL 52 - - 39(L) -  Calc LDL <130 mg/dL 78 - - 84 -  Triglycerides <150 mg/dL 76 - - 87 -  Creatinine 0.50 - 1.05 mg/dL 0.71 0.67 - 0.81 0.73   BP/Weight 12/19/2014 12/19/2014 08/01/2014 03/28/2014 10/26/2013 09/13/2013 [id number]  Systolic BP 123XX123 91 [id number] 0000000 [id number]  Diastolic BP 60 63 76 74 84 74 78  Wt. (Lbs) 121 120.8 123 127.12 129.4 128.8 131  BMI 19.54 22.09 19.86 23.24 23.66 23.55 23.95   Foot/eye exam completion dates Latest Ref Rng 12/19/2014 10/27/2013  Eye Exam No Retinopathy - No Retinopathy  Foot exam Order - - -  Foot Form Completion - Done -

## 2014-12-24 NOTE — Assessment & Plan Note (Signed)
Antibiotic prescribed 

## 2014-12-24 NOTE — Assessment & Plan Note (Signed)
Stable, treated by psychiatry 

## 2015-01-09 ENCOUNTER — Ambulatory Visit (INDEPENDENT_AMBULATORY_CARE_PROVIDER_SITE_OTHER): Payer: Medicaid Other

## 2015-01-09 DIAGNOSIS — Z23 Encounter for immunization: Secondary | ICD-10-CM

## 2015-01-17 ENCOUNTER — Other Ambulatory Visit: Payer: Self-pay | Admitting: Family Medicine

## 2015-03-08 ENCOUNTER — Other Ambulatory Visit: Payer: Self-pay

## 2015-03-08 DIAGNOSIS — E119 Type 2 diabetes mellitus without complications: Secondary | ICD-10-CM

## 2015-03-25 LAB — CBC WITH DIFFERENTIAL/PLATELET
Basophils Absolute: 0 10*3/uL (ref 0.0–0.1)
Basophils Relative: 0 % (ref 0–1)
Eosinophils Absolute: 0.1 10*3/uL (ref 0.0–0.7)
Eosinophils Relative: 1 % (ref 0–5)
HEMATOCRIT: 40.5 % (ref 36.0–46.0)
HEMOGLOBIN: 13.4 g/dL (ref 12.0–15.0)
LYMPHS ABS: 1.8 10*3/uL (ref 0.7–4.0)
LYMPHS PCT: 34 % (ref 12–46)
MCH: 30.9 pg (ref 26.0–34.0)
MCHC: 33.1 g/dL (ref 30.0–36.0)
MCV: 93.3 fL (ref 78.0–100.0)
MONO ABS: 0.4 10*3/uL (ref 0.1–1.0)
MPV: 9.3 fL (ref 8.6–12.4)
Monocytes Relative: 8 % (ref 3–12)
NEUTROS ABS: 3 10*3/uL (ref 1.7–7.7)
Neutrophils Relative %: 57 % (ref 43–77)
Platelets: 307 10*3/uL (ref 150–400)
RBC: 4.34 MIL/uL (ref 3.87–5.11)
RDW: 13 % (ref 11.5–15.5)
WBC: 5.3 10*3/uL (ref 4.0–10.5)

## 2015-03-25 LAB — HEMOGLOBIN A1C
Hgb A1c MFr Bld: 6.5 % — ABNORMAL HIGH (ref <5.7)
Mean Plasma Glucose: 140 mg/dL — ABNORMAL HIGH (ref <117)

## 2015-03-25 IMAGING — CR DG WRIST COMPLETE 3+V*R*
1 series · 1 of 1 positions shown · non-contrast
Comparison: None.

CLINICAL DATA: Pain

EXAM:
RIGHT WRIST - COMPLETE 3+ VIEW

[view not recorded]
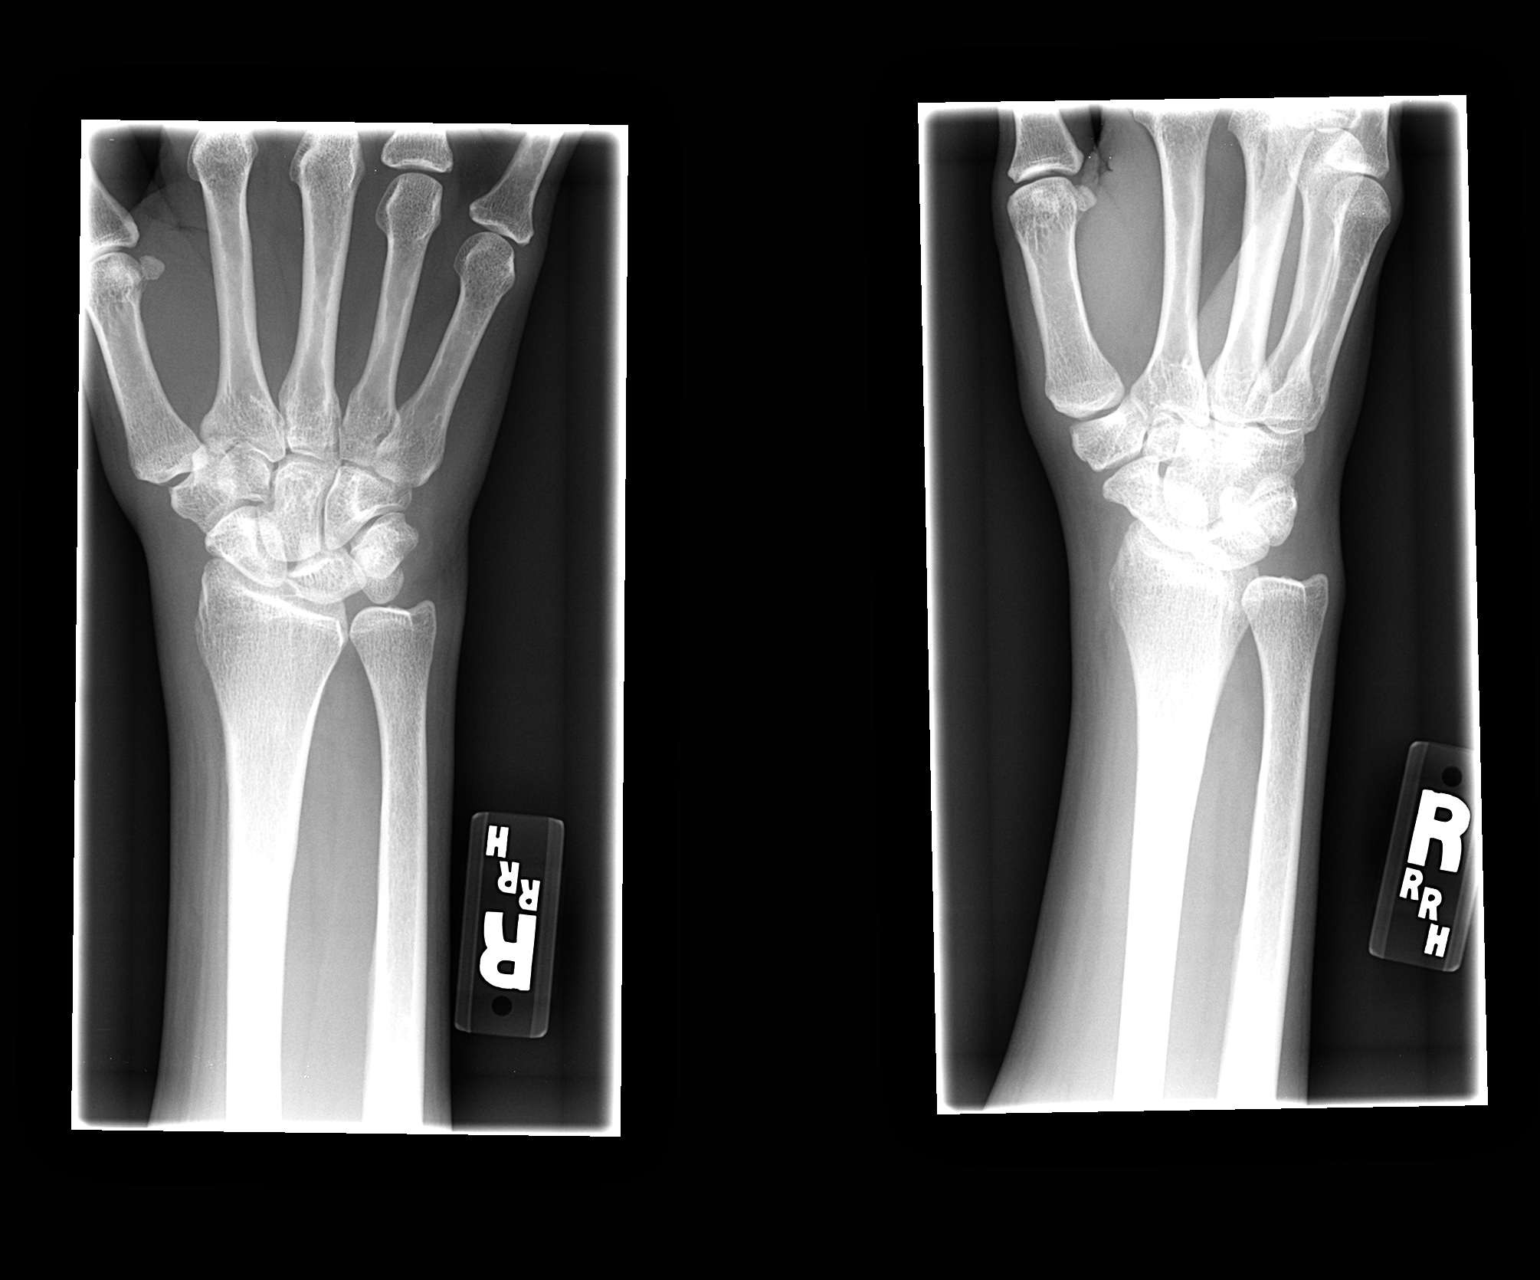

[1 of 1 positions shown; findings below may reference images not displayed]

FINDINGS: There is no evidence of fracture or dislocation. There is no
evidence of arthropathy or other focal bone abnormality. Soft
tissues are unremarkable.
IMPRESSION: Negative.

## 2015-03-26 LAB — BASIC METABOLIC PANEL WITH GFR
BUN: 11 mg/dL (ref 7–25)
CO2: 29 mmol/L (ref 20–31)
CREATININE: 0.71 mg/dL (ref 0.50–1.05)
Calcium: 9.9 mg/dL (ref 8.6–10.4)
Chloride: 102 mmol/L (ref 98–110)
GFR, Est African American: >89 mL/min (ref >=60)
GFR, Est Non African American: >89 mL/min (ref >=60)
GLUCOSE: 123 mg/dL — AB (ref 65–99)
Potassium: 4.2 mmol/L (ref 3.5–5.3)
SODIUM: 141 mmol/L (ref 135–146)

## 2015-03-26 LAB — MICROALBUMIN / CREATININE URINE RATIO
Creatinine, Urine: 110 mg/dL (ref 20–320)
MICROALB UR: 0.3 mg/dL
Microalb Creat Ratio: 3 mcg/mg creat (ref <30)

## 2015-03-26 LAB — HEPATITIS C ANTIBODY: HCV Ab: NEGATIVE

## 2015-04-04 ENCOUNTER — Ambulatory Visit: Payer: Medicaid Other | Admitting: Family Medicine

## 2015-04-05 ENCOUNTER — Ambulatory Visit (INDEPENDENT_AMBULATORY_CARE_PROVIDER_SITE_OTHER): Payer: Medicaid Other | Admitting: Family Medicine

## 2015-04-05 ENCOUNTER — Encounter: Payer: Self-pay | Admitting: Family Medicine

## 2015-04-05 ENCOUNTER — Other Ambulatory Visit: Payer: Self-pay

## 2015-04-05 VITALS — BP 112/80 | HR 82 | Resp 16 | Ht 62.0 in | Wt 124.0 lb

## 2015-04-05 DIAGNOSIS — F209 Schizophrenia, unspecified: Secondary | ICD-10-CM

## 2015-04-05 DIAGNOSIS — K219 Gastro-esophageal reflux disease without esophagitis: Secondary | ICD-10-CM

## 2015-04-05 DIAGNOSIS — R0789 Other chest pain: Secondary | ICD-10-CM | POA: Diagnosis not present

## 2015-04-05 DIAGNOSIS — E785 Hyperlipidemia, unspecified: Secondary | ICD-10-CM | POA: Diagnosis not present

## 2015-04-05 DIAGNOSIS — E119 Type 2 diabetes mellitus without complications: Secondary | ICD-10-CM | POA: Diagnosis not present

## 2015-04-05 DIAGNOSIS — J309 Allergic rhinitis, unspecified: Secondary | ICD-10-CM

## 2015-04-05 NOTE — Progress Notes (Signed)
Subjective:    Patient ID: Wendy Oneill, female    DOB: [birth year], 60 y.o.   MRN: [id number]  HPI   Wendy Oneill     MRN: [id number]      DOB: [birth year]   HPI Wendy Oneill is here for follow up and re-evaluation of chronic medical conditions, medication management and review of any available recent lab and radiology data.  Preventive health is updated, specifically  Cancer screening and Immunization.   Questions or concerns regarding consultations or procedures which the PT has had in the interim are  addressed. The PT denies any adverse reactions to current medications since the last visit.  Denies polyuria, polydipsia, blurred vision , or hypoglycemic episodes. 1 week h/o intermittent substernal and right sided chest pain, with and without activity, no associate nausea, light headedness or diaphoresis   ROS Denies recent fever or chills. Denies sinus pressure, nasal congestion, ear pain or sore throat. Denies chest congestion, productive cough or wheezing. Denies PND, orthopnea, palpitations and leg swelling Denies abdominal pain, nausea, vomiting,diarrhea or constipation.   Denies dysuria, frequency, hesitancy or incontinence. Denies joint pain, swelling and limitation in mobility. Denies headaches, seizures, numbness, or tingling. Denies depression, anxiety or insomnia. Denies skin break down or rash.   PE  BP 112/80 mmHg  Pulse 82  Resp 16  Ht 5\' 2"  (1.575 m)  Wt 124 lb (56.246 kg)  BMI 22.67 kg/m2  SpO2 98%  LMP 07/06/2012  Patient alert and oriented and in no cardiopulmonary distress.  HEENT: No facial asymmetry, EOMI,   oropharynx pink and moist.  Neck supple no JVD, no mass.  Chest: Clear to auscultation bilaterally.No reproducible chest wall pain  CVS: S1, S2 no murmurs, no S3.Regular rate. EKG: NSR, no LVH, no acute  ischemic change ABD: Soft non tender.   Ext: No edema  MS: Adequate ROM spine, shoulders, hips and knees.  Skin: Intact, no ulcerations  or rash noted.  Psych: Good eye contact, normal affect. Memory intact not anxious or depressed appearing.  CNS: CN 2-12 intact, power,  normal throughout.no focal deficits noted.   Assessment & Plan  Non-insulin-dependent diabetes mellitus without complications Improved and well controlled  Wendy Oneill is reminded of the importance of commitment to daily physical activity for 30 minutes or more, as able and the need to limit carbohydrate intake to 30 to 60 grams per meal to help with blood sugar control.   The need to take medication as prescribed, test blood sugar as directed, and to call between visits if there is a concern that blood sugar is uncontrolled is also discussed.   Wendy Oneill is reminded of the importance of daily foot exam, annual eye examination, and good blood sugar, blood pressure and cholesterol control.  Diabetic Labs Latest Ref Rng 03/25/2015 11/20/2014 07/25/2014 03/28/2014 02/19/2014  HbA1c <5.7 % 6.5(H) 6.8(H) 7.0(H) - 6.8(H)  Microalbumin Not estab mg/dL 0.3 - - <0.2 -  Micro/Creat Ratio <30 mcg/mg creat 3 - - SEE NOTE -  Chol 125 - 200 mg/dL - 145 - - 140  HDL >=46 mg/dL - 52 - - 39(L)  Calc LDL <130 mg/dL - 78 - - 84  Triglycerides <150 mg/dL - 76 - - 87  Creatinine 0.50 - 1.05 mg/dL 0.71 0.71 0.67 - 0.81   BP/Weight 04/05/2015 12/19/2014 12/19/2014 08/01/2014 03/28/2014 10/26/2013 [id number]  Systolic BP [id number] 91 [id number] 0000000 [id number]  Diastolic BP 80 60 63 76 74 84 74  Wt. (Lbs)  124 121 120.8 123 127.12 129.4 128.8  BMI 22.67 19.54 22.09 19.86 23.24 23.66 23.55   Foot/eye exam completion dates Latest Ref Rng 12/19/2014 10/27/2013  Eye Exam No Retinopathy - No Retinopathy  Foot exam Order - - -  Foot Form Completion - Done -         Hyperlipidemia LDL goal <100 Hyperlipidemia:Low fat diet discussed and encouraged.   Lipid Panel  Lab Results  Component Value Date   CHOL 145 11/20/2014   HDL 52 11/20/2014   LDLCALC 78 11/20/2014   TRIG 76 11/20/2014   CHOLHDL  2.8 11/20/2014   Updated lab needed at/ before next visit. Controlled, no change in medication     Schizophrenia, unspecified type Stable and controlled, no med change  Gastro-esophageal reflux Controlled, no change in medication   Allergic rhinitis Controlled, no change in medication   Atypical chest pain 1 week h/o intermittent chest pain. Increased CV risk EKG done at visit, no ischemic changes or LVH, NSR Pain likely due to GERD      Review of Systems     Objective:   Physical Exam        Assessment & Plan:

## 2015-04-05 NOTE — Patient Instructions (Addendum)
Physical with pap in 4 month, call if you need me sooner  Exam and labs are good    You need to keep appts for mammogram and eye exam with Dr Iona Hansen  Thanks for choosing [facility], we consider it a privelige to serve you.

## 2015-04-06 ENCOUNTER — Encounter: Payer: Self-pay | Admitting: Family Medicine

## 2015-04-06 DIAGNOSIS — R0789 Other chest pain: Secondary | ICD-10-CM | POA: Insufficient documentation

## 2015-04-06 NOTE — Assessment & Plan Note (Signed)
Stable and controlled, no med change 

## 2015-04-06 NOTE — Assessment & Plan Note (Signed)
Controlled, no change in medication  

## 2015-04-06 NOTE — Assessment & Plan Note (Signed)
Improved and well controlled  Wendy Oneill is reminded of the importance of commitment to daily physical activity for 30 minutes or more, as able and the need to limit carbohydrate intake to 30 to 60 grams per meal to help with blood sugar control.   The need to take medication as prescribed, test blood sugar as directed, and to call between visits if there is a concern that blood sugar is uncontrolled is also discussed.   Wendy Oneill is reminded of the importance of daily foot exam, annual eye examination, and good blood sugar, blood pressure and cholesterol control.  Diabetic Labs Latest Ref Rng 03/25/2015 11/20/2014 07/25/2014 03/28/2014 02/19/2014  HbA1c <5.7 % 6.5(H) 6.8(H) 7.0(H) - 6.8(H)  Microalbumin Not estab mg/dL 0.3 - - <0.2 -  Micro/Creat Ratio <30 mcg/mg creat 3 - - SEE NOTE -  Chol 125 - 200 mg/dL - 145 - - 140  HDL >=46 mg/dL - 52 - - 39(L)  Calc LDL <130 mg/dL - 78 - - 84  Triglycerides <150 mg/dL - 76 - - 87  Creatinine 0.50 - 1.05 mg/dL 0.71 0.71 0.67 - 0.81   BP/Weight 04/05/2015 12/19/2014 12/19/2014 08/01/2014 03/28/2014 10/26/2013 [id number]  Systolic BP [id number] 91 [id number] 0000000 [id number]  Diastolic BP 80 60 63 76 74 84 74  Wt. (Lbs) 124 121 120.8 123 127.12 129.4 128.8  BMI 22.67 19.54 22.09 19.86 23.24 23.66 23.55   Foot/eye exam completion dates Latest Ref Rng 12/19/2014 10/27/2013  Eye Exam No Retinopathy - No Retinopathy  Foot exam Order - - -  Foot Form Completion - Done -

## 2015-04-06 NOTE — Assessment & Plan Note (Signed)
1 week h/o intermittent chest pain. Increased CV risk EKG done at visit, no ischemic changes or LVH, NSR Pain likely due to GERD

## 2015-04-06 NOTE — Assessment & Plan Note (Signed)
Hyperlipidemia:Low fat diet discussed and encouraged.   Lipid Panel  Lab Results  Component Value Date   CHOL 145 11/20/2014   HDL 52 11/20/2014   LDLCALC 78 11/20/2014   TRIG 76 11/20/2014   CHOLHDL 2.8 11/20/2014   Updated lab needed at/ before next visit. Controlled, no change in medication

## 2015-04-11 ENCOUNTER — Ambulatory Visit (HOSPITAL_COMMUNITY): Payer: Medicaid Other

## 2015-04-22 ENCOUNTER — Other Ambulatory Visit: Payer: Self-pay | Admitting: Family Medicine

## 2015-04-30 ENCOUNTER — Encounter: Payer: Self-pay | Admitting: Gastroenterology

## 2015-05-07 ENCOUNTER — Other Ambulatory Visit: Payer: Self-pay

## 2015-05-07 MED ORDER — GLUCOSE BLOOD VI STRP
ORAL_STRIP | Status: DC
Start: 2015-05-07 — End: 2015-07-10

## 2015-05-15 ENCOUNTER — Telehealth: Payer: Self-pay | Admitting: Family Medicine

## 2015-05-15 NOTE — Telephone Encounter (Signed)
Patient is having cold symptoms since Sunday, denies fever, she states that there is no appetite , please advise?

## 2015-05-15 NOTE — Telephone Encounter (Signed)
Called and left message that Dr was out of the office but if she called back and provided symptoms that I could recommend some otc medication to use.

## 2015-05-16 ENCOUNTER — Emergency Department (HOSPITAL_COMMUNITY)
Admission: EM | Admit: 2015-05-16 | Discharge: 2015-05-16 | Disposition: A | Discharge status: Home / Self Care | Payer: Medicaid Other | Attending: Emergency Medicine | Admitting: Emergency Medicine

## 2015-05-16 ENCOUNTER — Encounter (HOSPITAL_COMMUNITY): Payer: Self-pay

## 2015-05-16 DIAGNOSIS — Z7984 Long term (current) use of oral hypoglycemic drugs: Secondary | ICD-10-CM | POA: Insufficient documentation

## 2015-05-16 DIAGNOSIS — Z87891 Personal history of nicotine dependence: Secondary | ICD-10-CM | POA: Diagnosis not present

## 2015-05-16 DIAGNOSIS — Z79899 Other long term (current) drug therapy: Secondary | ICD-10-CM | POA: Insufficient documentation

## 2015-05-16 DIAGNOSIS — Z7982 Long term (current) use of aspirin: Secondary | ICD-10-CM | POA: Diagnosis not present

## 2015-05-16 DIAGNOSIS — E785 Hyperlipidemia, unspecified: Secondary | ICD-10-CM | POA: Diagnosis not present

## 2015-05-16 DIAGNOSIS — E119 Type 2 diabetes mellitus without complications: Secondary | ICD-10-CM | POA: Diagnosis not present

## 2015-05-16 DIAGNOSIS — B349 Viral infection, unspecified: Secondary | ICD-10-CM

## 2015-05-16 DIAGNOSIS — J029 Acute pharyngitis, unspecified: Secondary | ICD-10-CM | POA: Diagnosis present

## 2015-05-16 LAB — RAPID STREP SCREEN (MED CTR MEBANE ONLY): Streptococcus, Group A Screen (Direct): NEGATIVE

## 2015-05-16 NOTE — ED Provider Notes (Signed)
CSN: [id number]     Arrival date & time 05/16/15  [id number] History  By signing my name below, I, Stephania Fragmin, attest that this documentation has been prepared under the direction and in the presence of Nat Christen, MD. Electronically Signed: Stephania Fragmin, ED Scribe. 05/16/2015. 9:05 AM.   Chief Complaint  Patient presents with  . Sore Throat   The history is provided by the patient. No language interpreter was used.   HPI Comments: DAHLIA DELASHMIT is a 60 y.o. female with a history of allergic rhinitis, DM type 1, and GERD, who presents to the Emergency Department complaining of a sore throat that began 1 week ago. She also complains of generalized myalgias, cough, nausea, and loss of appetite. Patient reports possible sick contact with her granddaughter, who had strep throat lad week. Patient denies fever or urinary symptoms.   PCP: Tula Nakayama, MD    Past Medical History  Diagnosis Date  . Allergic rhinitis   . Personal history of schizophrenia   . Diabetes mellitus, type 1   . Hyperlipidemia   . Adenoma May 2010    Simple : TCS   . GERD (gastroesophageal reflux disease)    Past Surgical History  Procedure Laterality Date  . Multiple tooth extractions  Feb 03, 2010  . Colonoscopy  MAY 2010    SIMPLE ADENOMAS   Family History  Problem Relation Age of Onset  . Diabetes Mother   . Hypertension Mother    Social History  Substance Use Topics  . Smoking status: Former Research scientist (life sciences)  . Smokeless tobacco: Never Used     Comment: Never smoked  . Alcohol Use: No   OB History    No data available     Review of Systems  Constitutional: Positive for appetite change. Negative for fever.  HENT: Positive for sore throat.   Respiratory: Positive for cough.   Gastrointestinal: Positive for nausea.  Genitourinary: Negative for dysuria, urgency, frequency, hematuria, decreased urine volume and difficulty urinating.  Musculoskeletal: Positive for myalgias.  All other systems reviewed and are  negative.     Allergies  Risperidone and Sulfonamide derivatives  Home Medications   Prior to Admission medications   Medication Sig Start Date End Date Taking? Authorizing Provider  ASPIRIN LOW DOSE 81 MG EC tablet TAKE (1) TABLET BY MOUTH EACH MORNING. 11/21/14  Yes Fayrene Helper, MD  calcium-vitamin D (OSCAL WITH D) 500-200 MG-UNIT per tablet TAKE (1) TABLET BY MOUTH (3) TIMES DAILY. 08/20/14  Yes Fayrene Helper, MD  COGENTIN 0.5 MG tablet TAKE (1) TABLET BY MOUTH TWICE DAILY. 05/26/11  Yes Fayrene Helper, MD  escitalopram (LEXAPRO) 10 MG tablet TAKE (1) TABLET BY MOUTH EACH MORNING. 04/26/12  Yes Fayrene Helper, MD  ferrous sulfate 325 (65 FE) MG tablet TAKE 1 TABLET BY MOUTH ONCE DAILY. 11/21/14  Yes Fayrene Helper, MD  fluticasone (FLONASE) 50 MCG/ACT nasal spray Place 2 sprays into both nostrils daily. 08/01/14  Yes Fayrene Helper, MD  lisinopril (PRINIVIL,ZESTRIL) 5 MG tablet TAKE 1 TABLET BY MOUTH ONCE DAILY. 01/18/15  Yes Fayrene Helper, MD  loratadine (CLARITIN) 10 MG tablet TAKE ONE TABLET BY MOUTH ONCE DAILY. 11/21/14  Yes Fayrene Helper, MD  lovastatin (MEVACOR) 20 MG tablet TAKE 1 TABLET BY MOUTH AT BEDTIME. 11/21/14  Yes Fayrene Helper, MD  metFORMIN (GLUCOPHAGE-XR) 500 MG 24 hr tablet TAKE 4 TABLETS (2000mg ) BY MOUTH WITH BREAKFAST. 11/21/14  Yes Fayrene Helper, MD  omeprazole (PRILOSEC) 20 MG capsule 1 po 30 mins prior to FIRST MEAL 12/19/14  Yes Danie Binder, MD  promethazine-dextromethorphan (PROMETHAZINE-DM) 6.25-15 MG/5ML syrup Take 5 mLs by mouth at bedtime. For excess cough and congestion.   Yes Historical Provider, MD  ranitidine (ZANTAC) 150 MG tablet TAKE 1 TABLET BY MOUTH TWICE DAILY. 04/24/15  Yes Fayrene Helper, MD  risperiDONE (RISPERDAL) 2 MG tablet Take 2 mg by mouth 3 (three) times daily. Take one tablet by mouth in the AM and two tablets in the PM   Yes Historical Provider, MD  STOOL SOFTENER 100 MG capsule TAKE (1) CAPSULE  BY MOUTH TWICE DAILY. 11/21/14  Yes Fayrene Helper, MD  Wheat Dextrin (BENEFIBER) POWD Take by mouth. 1 table spoon into food or beverage three times daily   Yes Historical Provider, MD  clotrimazole-betamethasone (LOTRISONE) cream APPLY TO AFFECTED AREA(S) OF SKIN TWICE DAILY AS NEEDED FOR RASH OR IRRITATION. Patient not taking: Reported on 05/16/2015 03/31/13   Fayrene Helper, MD  glucose blood (ACCU-CHEK AVIVA PLUS) test strip Use as instructed once daily dx e11.59 Patient not taking: Reported on 05/16/2015 05/07/15   Fayrene Helper, MD  STIMULANT LAXATIVE 5 MG EC tablet TAKE 1 TABLET BY MOUTH ONCE DAILY AS NEEDED FOR CONSTIPATION. Patient not taking: Reported on 05/16/2015 01/25/14   Fayrene Helper, MD   BP 115/68 mmHg  Pulse 96  Temp(Src) 98.2 F (36.8 C) (Oral)  Resp 16  Ht 5\' 2"  (1.575 m)  Wt 123 lb (55.792 kg)  BMI 22.49 kg/m2  SpO2 100%  LMP 07/06/2012 Physical Exam  Constitutional: She is oriented to person, place, and time. She appears well-developed and well-nourished.  HENT:  Head: Normocephalic and atraumatic.  Right Ear: Hearing, tympanic membrane, external ear and ear canal normal.  Left Ear: Hearing, tympanic membrane, external ear and ear canal normal.  Mouth/Throat: Posterior oropharyngeal erythema present.  Oropharynx mildly erythematous.  Eyes: Conjunctivae and EOM are normal. Pupils are equal, round, and reactive to light.  Neck: Normal range of motion. Neck supple.  Cardiovascular: Normal rate and regular rhythm.   Pulmonary/Chest: Effort normal and breath sounds normal.  Abdominal: Soft. Bowel sounds are normal.  Musculoskeletal: Normal range of motion.  Neurological: She is alert and oriented to person, place, and time.  Skin: Skin is warm and dry.  Psychiatric: She has a normal mood and affect. Her behavior is normal.  Nursing note and vitals reviewed.   ED Course  Procedures (including critical care time)  DIAGNOSTIC STUDIES: Oxygen  Saturation is 100% on RA, normal by my interpretation.    COORDINATION OF CARE: 8:49 AM - Discussed treatment plan with pt at bedside which includes strep screen. If negative, will treat as UTI. Pt verbalized understanding and agreed to plan.   Labs Review Labs Reviewed  RAPID STREP SCREEN (NOT AT [facility])  CULTURE, GROUP A STREP [facility])    MDM   Final diagnoses:  Viral syndrome   Patient is nontoxic-appearing. She is well-hydrated. Strep screen negative. Suspect viral syndrome. I personally performed the services described in this documentation, which was scribed in my presence. The recorded information has been reviewed and is accurate.      Nat Christen, MD 05/16/15 1032

## 2015-05-16 NOTE — Discharge Instructions (Signed)
Strep test was negative. Increase fluids. Tylenol for fever or pain. Over-the-counter cough syrup.

## 2015-05-16 NOTE — ED Notes (Signed)
Complain of sore throat and cough for a week

## 2015-05-18 LAB — CULTURE, GROUP A STREP (THRC)

## 2015-05-20 NOTE — Telephone Encounter (Signed)
Was out on Friday, per [place], never called back

## 2015-05-21 LAB — HM DIABETES EYE EXAM

## 2015-05-23 ENCOUNTER — Other Ambulatory Visit: Payer: Self-pay

## 2015-05-23 MED ORDER — LISINOPRIL 5 MG PO TABS: 5.0000 mg | ORAL_TABLET | Freq: Every day | ORAL | Status: DC

## 2015-06-20 ENCOUNTER — Other Ambulatory Visit: Payer: Self-pay | Admitting: Family Medicine

## 2015-07-10 ENCOUNTER — Other Ambulatory Visit: Payer: Self-pay | Admitting: Family Medicine

## 2015-08-05 ENCOUNTER — Ambulatory Visit (INDEPENDENT_AMBULATORY_CARE_PROVIDER_SITE_OTHER): Payer: Medicaid Other | Admitting: Family Medicine

## 2015-08-05 ENCOUNTER — Other Ambulatory Visit (HOSPITAL_COMMUNITY)
Admission: RE | Admit: 2015-08-05 | Discharge: 2015-08-05 | Disposition: A | Discharge status: Home / Self Care | Payer: Medicaid Other | Source: Ambulatory Visit | Attending: Family Medicine | Admitting: Family Medicine

## 2015-08-05 ENCOUNTER — Encounter: Payer: Self-pay | Admitting: Family Medicine

## 2015-08-05 VITALS — BP 100/64 | HR 94 | Resp 16 | Ht 62.0 in | Wt 125.0 lb

## 2015-08-05 DIAGNOSIS — Z01419 Encounter for gynecological examination (general) (routine) without abnormal findings: Secondary | ICD-10-CM | POA: Insufficient documentation

## 2015-08-05 DIAGNOSIS — E119 Type 2 diabetes mellitus without complications: Secondary | ICD-10-CM | POA: Diagnosis not present

## 2015-08-05 DIAGNOSIS — E11628 Type 2 diabetes mellitus with other skin complications: Secondary | ICD-10-CM | POA: Diagnosis not present

## 2015-08-05 DIAGNOSIS — Z1159 Encounter for screening for other viral diseases: Secondary | ICD-10-CM

## 2015-08-05 DIAGNOSIS — Z124 Encounter for screening for malignant neoplasm of cervix: Secondary | ICD-10-CM

## 2015-08-05 DIAGNOSIS — L84 Corns and callosities: Secondary | ICD-10-CM

## 2015-08-05 DIAGNOSIS — I9589 Other hypotension: Secondary | ICD-10-CM

## 2015-08-05 DIAGNOSIS — I959 Hypotension, unspecified: Secondary | ICD-10-CM | POA: Insufficient documentation

## 2015-08-05 DIAGNOSIS — E785 Hyperlipidemia, unspecified: Secondary | ICD-10-CM

## 2015-08-05 DIAGNOSIS — Z0000 Encounter for general adult medical examination without abnormal findings: Secondary | ICD-10-CM

## 2015-08-05 DIAGNOSIS — Z1211 Encounter for screening for malignant neoplasm of colon: Secondary | ICD-10-CM

## 2015-08-05 LAB — POC HEMOCCULT BLD/STL (OFFICE/1-CARD/DIAGNOSTIC): Fecal Occult Blood, POC: NEGATIVE

## 2015-08-05 MED ORDER — LISINOPRIL 2.5 MG PO TABS: 2.5000 mg | ORAL_TABLET | Freq: Every day | ORAL | Status: DC

## 2015-08-05 NOTE — Assessment & Plan Note (Signed)
Refer podiatry for foot care

## 2015-08-05 NOTE — Assessment & Plan Note (Signed)

## 2015-08-05 NOTE — Assessment & Plan Note (Signed)
Remain on ACE for renal protection but dose is lowered, will re eval in 3 months

## 2015-08-05 NOTE — Patient Instructions (Signed)
F/u in 3.5 month, call if you need me sooner  Discontinue lisinopril 5 mg one daily, new is lisinopril 2.5 mg one daily, starting tomorrow  If you have already taken your tablet today  Fasting lipid, cmp and EGFr and HBa1C in 2 weeks pls, on 7/231/2017  You are referred to podiatrist for foot care due to calluses and the fact that you are a diabetic, do not pick at skin as you may injure yourself

## 2015-08-05 NOTE — Progress Notes (Signed)
Wendy Oneill     MRN: [id number]      DOB: 02/12/1955  HPI: Patient is in for annual physical exam. C/o callus on left foot which she recently picked at , needs podiatry to manage her foot care Recent labs, if available are reviewed. Immunization is reviewed , and  updated if needed.   PE: Pleasant  female, alert and oriented x 3, in no cardio-pulmonary distress. Afebrile. HEENT No facial trauma or asymetry. Sinuses non tender.  Extra occullar muscles intact, pupils equally reactive to light. External ears normal, tympanic membranes clear. Oropharynx moist, no exudate.Edentulous Neck: supple, no adenopathy,JVD or thyromegaly.No bruits.  Chest: Clear to ascultation bilaterally.No crackles or wheezes. Non tender to palpation  Breast: No asymetry,no masses or lumps. No tenderness. No nipple discharge or inversion. No axillary or supraclavicular adenopathy  Cardiovascular system; Heart sounds normal,  S1 and  S2 ,no S3.  No murmur, or thrill. Apical beat not displaced Peripheral pulses normal.  Abdomen: Soft, non tender, no organomegaly or masses. No bruits. Bowel sounds normal. No guarding, tenderness or rebound.  Rectal:  Normal sphincter tone. No rectal mass. Guaiac negative stool.  GU: External genitalia normal female genitalia , normal female distribution of hair. No lesions. Urethral meatus normal in size, no  Prolapse, no lesions visibly  Present. Bladder non tender. Vagina pink and moist , with no visible lesions , discharge present . Adequate pelvic support no  cystocele or rectocele noted Cervix pink and appears healthy, no lesions or ulcerations noted, no discharge noted from os Uterus normal size, no adnexal masses, no cervical motion or adnexal tenderness.   Musculoskeletal exam: Full ROM of spine, hips , shoulders and knees. No deformity ,swelling or crepitus noted. No muscle wasting or atrophy.   Neurologic: Cranial nerves 2 to 12  intact. Power, tone ,sensation and reflexes normal throughout. No disturbance in gait. No tremor.  Skin: Intact, no ulceration, erythema , foot callus , left worse than right. Pigmentation normal throughout  Psych; Normal mood and affect. Judgement and concentration normal   Assessment & Plan:   Annual physical exam Annual exam as documented. Counseling done  re healthy lifestyle involving commitment to 150 minutes exercise per week, heart healthy diet, and attaining healthy weight.The importance of adequate sleep also discussed. Regular seat belt use and home safety, is also discussed. Changes in health habits are decided on by the patient with goals and time frames  set for achieving them. Immunization and cancer screening needs are specifically addressed at this visit.   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   The need to take medication as prescribed, test blood sugar as directed, and to call between visits if there is a concern that blood sugar is uncontrolled is also discussed.   Wendy Oneill is reminded of the importance of daily foot exam, annual eye examination, and good blood sugar, blood pressure and cholesterol control.  Diabetic Labs Latest Ref Rng 03/25/2015 11/20/2014 07/25/2014 03/28/2014 02/19/2014  HbA1c <5.7 % 6.5(H) 6.8(H) 7.0(H) - 6.8(H)  Microalbumin Not estab mg/dL 0.3 - - <  0.2 -  Micro/Creat Ratio <30 mcg/mg creat 3 - - SEE NOTE -  Chol 125 - 200 mg/dL - 145 - - 140  HDL >=46 mg/dL - 52 - - 39(L)  Calc LDL <130 mg/dL - 78 - - 84  Triglycerides <150 mg/dL  - 76 - - 87  Creatinine 0.50 - 1.05 mg/dL 0.71 0.71 0.67 - 0.81   BP/Weight 08/05/2015 05/16/2015 04/05/2015 12/19/2014 12/19/2014 [id number]  Systolic BP 123XX123 [id number] XX123456 123XX123 91 [id number]  Diastolic BP 64 63 80 60 63 76 74  Wt. (Lbs) 125 123 124 121 120.8 123 127.12  BMI 22.86 22.49 22.67 19.54 22.09 19.86 23.24   Foot/eye exam completion dates Latest Ref Rng 05/21/2015 12/19/2014  Eye Exam No Retinopathy No Retinopathy -  Foot Form Completion - - Done       Updated lab needed in 2 weeks

## 2015-08-05 NOTE — Assessment & Plan Note (Signed)
Wendy Oneill is reminded of the importance of commitment to daily physical activity for 30 minutes or more, as able and the need to limit carbohydrate intake to 30 to 60 grams per meal to help with blood sugar control.   The need to take medication as prescribed, test blood sugar as directed, and to call between visits if there is a concern that blood sugar is uncontrolled is also discussed.   Wendy Oneill is reminded of the importance of daily foot exam, annual eye examination, and good blood sugar, blood pressure and cholesterol control.  Diabetic Labs Latest Ref Rng 03/25/2015 11/20/2014 07/25/2014 03/28/2014 02/19/2014  HbA1c <5.7 % 6.5(H) 6.8(H) 7.0(H) - 6.8(H)  Microalbumin Not estab mg/dL 0.3 - - <0.2 -  Micro/Creat Ratio <30 mcg/mg creat 3 - - SEE NOTE -  Chol 125 - 200 mg/dL - 145 - - 140  HDL >=46 mg/dL - 52 - - 39(L)  Calc LDL <130 mg/dL - 78 - - 84  Triglycerides <150 mg/dL - 76 - - 87  Creatinine 0.50 - 1.05 mg/dL 0.71 0.71 0.67 - 0.81   BP/Weight 08/05/2015 05/16/2015 04/05/2015 12/19/2014 12/19/2014 123456 [id number]  Systolic BP 123XX123 [id number] XX123456 [id number] 91 [id number]  Diastolic BP 64 63 80 60 63 76 74  Wt. (Lbs) 125 123 124 121 120.8 123 127.12  BMI 22.86 22.49 22.67 19.54 22.09 19.86 23.24   Foot/eye exam completion dates Latest Ref Rng 05/21/2015 12/19/2014  Eye Exam No Retinopathy No Retinopathy -  Foot Form Completion - - Done       Updated lab needed in 2 weeks

## 2015-08-06 LAB — CYTOLOGY - PAP

## 2015-08-20 ENCOUNTER — Other Ambulatory Visit: Payer: Self-pay | Admitting: Family Medicine

## 2015-09-02 ENCOUNTER — Telehealth: Payer: Self-pay | Admitting: Family Medicine

## 2015-09-02 ENCOUNTER — Other Ambulatory Visit: Payer: Self-pay | Admitting: Family Medicine

## 2015-09-02 ENCOUNTER — Other Ambulatory Visit: Payer: Self-pay

## 2015-09-02 MED ORDER — ACCU-CHEK SOFTCLIX LANCETS MISC: 5 refills | Status: DC

## 2015-09-02 MED ORDER — GLUCOSE BLOOD VI STRP: ORAL_STRIP | 5 refills | Status: DC

## 2015-09-02 NOTE — Telephone Encounter (Signed)
refilled 

## 2015-09-02 NOTE — Telephone Encounter (Signed)
Wendy Oneill is calling stating that [facility] is out of Diabetic Testing Strips and she needs them called to [facility], please advise?

## 2015-09-03 LAB — COMPLETE METABOLIC PANEL WITH GFR
ALBUMIN: 3.9 g/dL (ref 3.6–5.1)
ALT: 12 U/L (ref 6–29)
AST: 17 U/L (ref 10–35)
Alkaline Phosphatase: 46 U/L (ref 33–130)
BILIRUBIN TOTAL: 0.3 mg/dL (ref 0.2–1.2)
BUN: 10 mg/dL (ref 7–25)
CALCIUM: 10 mg/dL (ref 8.6–10.4)
CHLORIDE: 106 mmol/L (ref 98–110)
CO2: 27 mmol/L (ref 20–31)
CREATININE: 0.87 mg/dL (ref 0.50–1.05)
GFR, Est African American: 85 mL/min (ref >=60)
GFR, Est Non African American: 74 mL/min (ref >=60)
Glucose, Bld: 101 mg/dL — ABNORMAL HIGH (ref 65–99)
Potassium: 4.8 mmol/L (ref 3.5–5.3)
Sodium: 143 mmol/L (ref 135–146)
TOTAL PROTEIN: 6.8 g/dL (ref 6.1–8.1)

## 2015-09-03 LAB — LIPID PANEL
CHOLESTEROL: 142 mg/dL (ref 125–200)
HDL: 46 mg/dL (ref >=46)
LDL Cholesterol: 78 mg/dL (ref <130)
TRIGLYCERIDES: 89 mg/dL (ref <150)
Total CHOL/HDL Ratio: 3.1 Ratio (ref <=5.0)
VLDL: 18 mg/dL (ref <30)

## 2015-09-03 LAB — HEMOGLOBIN A1C
HEMOGLOBIN A1C: 6.8 % — AB (ref <5.7)
Mean Plasma Glucose: 148 mg/dL

## 2015-09-11 ENCOUNTER — Telehealth: Payer: Self-pay | Admitting: Family Medicine

## 2015-09-11 MED ORDER — GLUCOSE BLOOD VI STRP: ORAL_STRIP | 5 refills | Status: DC

## 2015-09-11 NOTE — Telephone Encounter (Signed)
Spoke with caregiver [facility]) patient has not received refill on testing strips sent in on 8/14.   Contacted pharmacy and refill was not received.   Also patient does not qualify to switch machines because of her insurance.

## 2015-09-11 NOTE — Telephone Encounter (Signed)
Rise Paganini is calling asking for a refill on [facility] Diabetic Test Strips, please advise?

## 2015-09-20 ENCOUNTER — Other Ambulatory Visit: Payer: Self-pay | Admitting: Family Medicine

## 2015-09-20 ENCOUNTER — Other Ambulatory Visit: Payer: Self-pay

## 2015-09-20 MED ORDER — CALCIUM CARBONATE-VITAMIN D 500-200 MG-UNIT PO TABS: ORAL_TABLET | ORAL | 5 refills | Status: DC

## 2015-11-06 ENCOUNTER — Ambulatory Visit (INDEPENDENT_AMBULATORY_CARE_PROVIDER_SITE_OTHER): Payer: Medicaid Other | Admitting: Gastroenterology

## 2015-11-06 ENCOUNTER — Encounter: Payer: Self-pay | Admitting: Gastroenterology

## 2015-11-06 VITALS — BP 107/66 | HR 91 | Temp 97.8°F | Ht 65.0 in | Wt 123.8 lb

## 2015-11-06 DIAGNOSIS — K219 Gastro-esophageal reflux disease without esophagitis: Secondary | ICD-10-CM

## 2015-11-06 MED ORDER — OMEPRAZOLE 20 MG PO CPDR: DELAYED_RELEASE_CAPSULE | ORAL | 3 refills | Status: DC

## 2015-11-06 NOTE — Patient Instructions (Signed)
Continue Prilosec once each morning, 30 minutes before breakfast.   We will see you in 6 months.  Your next colonoscopy will be in 2020 or sooner if needed!

## 2015-11-06 NOTE — Progress Notes (Signed)
Referring Provider: Fayrene Helper, MD Primary Care Physician:  Tula Nakayama, MD  Primary GI: Dr. Oneida Alar   Chief Complaint  Patient presents with  . Follow-up    HPI:   Wendy Oneill is a 60 y.o. female presenting today with a history of GERD, here for routine follow-up.   No constipation or diarrhea. No rectal bleeding. Sometimes right before her bowels move she will have some discomfort. Takes Prilosec once each morning, 30 minutes before breakfast. No dysphagia. No N/V.   Next colonoscopy due in 2020.   Past Medical History:  Diagnosis Date  . Adenoma May 2010   Simple : TCS   . Allergic rhinitis   . Diabetes mellitus, type 1   . GERD (gastroesophageal reflux disease)   . Hyperlipidemia   . Personal history of schizophrenia     Past Surgical History:  Procedure Laterality Date  . COLONOSCOPY  MAY 2010   SIMPLE ADENOMAS  . MULTIPLE TOOTH EXTRACTIONS  Feb 03, 2010    Current Outpatient Prescriptions  Medication Sig Dispense Refill  . ACCU-CHEK SOFTCLIX LANCETS lancets USE TO TEST ONCE DAILY. DX E11.9 100 each 5  . ASPIRIN LOW DOSE 81 MG EC tablet TAKE (1) TABLET BY MOUTH EACH MORNING. 30 tablet 11  . benazepril (LOTENSIN) 5 MG tablet Take 5 mg by mouth daily.    . calcium-vitamin D (OSCAL WITH D) 500-200 MG-UNIT tablet TAKE (1) TABLET BY MOUTH (3) TIMES DAILY. 90 tablet 5  . COGENTIN 0.5 MG tablet TAKE (1) TABLET BY MOUTH TWICE DAILY. 60 each 2  . escitalopram (LEXAPRO) 10 MG tablet TAKE (1) TABLET BY MOUTH EACH MORNING. 30 tablet 0  . ferrous sulfate 325 (65 FE) MG tablet TAKE 1 TABLET BY MOUTH ONCE DAILY. 30 tablet 11  . fluticasone (FLONASE) 50 MCG/ACT nasal spray Place 2 sprays into both nostrils daily. 16 g 6  . glucose blood (ACCU-CHEK AVIVA PLUS) test strip USE TO TEST ONCE DAILY. DX E11.9 50 each 5  . lisinopril (PRINIVIL,ZESTRIL) 2.5 MG tablet Take 1 tablet (2.5 mg total) by mouth daily. 30 tablet 11  . loratadine (CLARITIN) 10 MG tablet TAKE ONE  TABLET BY MOUTH ONCE DAILY. 30 tablet 11  . lovastatin (MEVACOR) 20 MG tablet TAKE 1 TABLET BY MOUTH AT BEDTIME. 30 tablet 3  . metFORMIN (GLUCOPHAGE-XR) 500 MG 24 hr tablet TAKE 4 TABLETS (2000mg ) BY MOUTH WITH BREAKFAST. 120 tablet 3  . omeprazole (PRILOSEC) 20 MG capsule 1 po 30 mins prior to FIRST MEAL 90 capsule 3  . ranitidine (ZANTAC) 150 MG tablet TAKE 1 TABLET BY MOUTH TWICE DAILY. 60 tablet 3  . risperiDONE (RISPERDAL) 2 MG tablet Take 2 mg by mouth 3 (three) times daily. Take one tablet by mouth in the AM and two tablets in the PM    . STOOL SOFTENER 100 MG capsule TAKE (1) CAPSULE BY MOUTH TWICE DAILY. 60 capsule 11  . Wheat Dextrin (BENEFIBER) POWD Take by mouth. 1 table spoon into food or beverage three times daily     No current facility-administered medications for this visit.     Allergies as of 11/06/2015 - Review Complete 11/06/2015  Allergen Reaction Noted  . Risperidone    . Sulfonamide derivatives  11/06/2009    Family History  Problem Relation Age of Onset  . Diabetes Mother   . Hypertension Mother   . Colon cancer Neg Hx     Social History   Social History  . Marital  status: Single    Spouse name: N/A  . Number of children: N/A  . Years of education: N/A   Occupational History  . pt on fixed income     Social History Main Topics  . Smoking status: Former Research scientist (life sciences)  . Smokeless tobacco: Never Used     Comment: Never smoked  . Alcohol use No  . Drug use: No  . Sexual activity: No   Other Topics Concern  . None   Social History Narrative    Pt  Has Rx for Diabetic shoes     Review of Systems: Negative unless mentioned in HPI   Physical Exam: BP 107/66   Pulse 91   Temp 97.8 F (36.6 C) (Oral)   Ht 5\' 5"  (1.651 m)   Wt 123 lb 12.8 oz (56.2 kg)   LMP 07/06/2012   BMI 20.60 kg/m  General:   Alert and oriented. No distress noted. Pleasant and cooperative.  Head:  Normocephalic and atraumatic. Eyes:  Conjuctiva clear without scleral  icterus. Abdomen:  +BS, soft, non-tender and non-distended. No rebound or guarding. No HSM or masses noted. Msk:  Symmetrical without gross deformities. Normal posture. Extremities:  Without edema. Neurologic:  Alert and  oriented x4;  grossly normal neurologically. Psych:  Alert and cooperative. Normal mood and affect.

## 2015-11-06 NOTE — Progress Notes (Signed)
cc'ed to pcp °

## 2015-11-06 NOTE — Assessment & Plan Note (Signed)
Doing well with Prilosec once daily. Continue. Refills provided. Return in 6 months. Next colonoscopy in 2020 unless clinically indicated otherwise.

## 2015-11-21 ENCOUNTER — Other Ambulatory Visit: Payer: Self-pay | Admitting: Family Medicine

## 2015-11-26 ENCOUNTER — Other Ambulatory Visit: Payer: Self-pay | Admitting: Family Medicine

## 2015-11-26 DIAGNOSIS — J3089 Other allergic rhinitis: Secondary | ICD-10-CM

## 2015-11-27 ENCOUNTER — Encounter: Payer: Self-pay | Admitting: Family Medicine

## 2015-11-27 ENCOUNTER — Ambulatory Visit (INDEPENDENT_AMBULATORY_CARE_PROVIDER_SITE_OTHER): Payer: Medicaid Other | Admitting: Family Medicine

## 2015-11-27 VITALS — BP 120/80 | HR 84 | Resp 16 | Ht 65.0 in | Wt 126.1 lb

## 2015-11-27 DIAGNOSIS — F209 Schizophrenia, unspecified: Secondary | ICD-10-CM

## 2015-11-27 DIAGNOSIS — Z23 Encounter for immunization: Secondary | ICD-10-CM | POA: Diagnosis not present

## 2015-11-27 DIAGNOSIS — J302 Other seasonal allergic rhinitis: Secondary | ICD-10-CM

## 2015-11-27 DIAGNOSIS — I9589 Other hypotension: Secondary | ICD-10-CM

## 2015-11-27 DIAGNOSIS — E785 Hyperlipidemia, unspecified: Secondary | ICD-10-CM | POA: Diagnosis not present

## 2015-11-27 DIAGNOSIS — E11628 Type 2 diabetes mellitus with other skin complications: Secondary | ICD-10-CM

## 2015-11-27 DIAGNOSIS — L84 Corns and callosities: Secondary | ICD-10-CM

## 2015-11-27 DIAGNOSIS — E559 Vitamin D deficiency, unspecified: Secondary | ICD-10-CM | POA: Diagnosis not present

## 2015-11-27 DIAGNOSIS — K219 Gastro-esophageal reflux disease without esophagitis: Secondary | ICD-10-CM

## 2015-11-27 NOTE — Patient Instructions (Addendum)
F/u in 4.5 month, call if you need me sooner  NEED mammogram, have not had for 3 years, appointment is being given at time of checkout  Flu vaccine today  Non fasting labs 2nd week in December, cBC, tSH,cmp and eGFR, hBA1c and vit d  Thank you  for choosing [facility]. We consider it a privelige to serve you.  Delivering excellent health care in a caring and  compassionate way is our goal.  Partnering with you,  so that together we can achieve this goal is our strategy.

## 2015-12-01 NOTE — Assessment & Plan Note (Signed)
Managed by psychiatry and stable 

## 2015-12-01 NOTE — Assessment & Plan Note (Signed)
Hyperlipidemia:Low fat diet discussed and encouraged.   Lipid Panel  Lab Results  Component Value Date   CHOL 142 09/02/2015   HDL 46 09/02/2015   LDLCALC 78 09/02/2015   TRIG 89 09/02/2015   CHOLHDL 3.1 09/02/2015   Updated lab needed at/ before next visit.

## 2015-12-01 NOTE — Assessment & Plan Note (Signed)
Controlled, no change in medication  

## 2015-12-01 NOTE — Progress Notes (Signed)
Wendy Oneill     MRN: [id number]      DOB: September 29, 1955   HPI Wendy Oneill is here for follow up and re-evaluation of chronic medical conditions, medication management and review of any available recent lab and radiology data.  Preventive health is updated, specifically  Cancer screening and Immunization.   Questions or concerns regarding consultations or procedures which the PT has had in the interim are  addressed. The PT denies any adverse reactions to current medications since the last visit.  There are no new concerns.  There are no specific complaints   ROS Denies recent fever or chills. Denies sinus pressure, nasal congestion, ear pain or sore throat. Denies chest congestion, productive cough or wheezing. Denies chest pains, palpitations and leg swelling Denies abdominal pain, nausea, vomiting,diarrhea or constipation.   Denies dysuria, frequency, hesitancy or incontinence. Denies joint pain, swelling and limitation in mobility. Denies headaches, seizures, numbness, or tingling. Denies depression, anxiety or insomnia. Denies skin break down or rash.   PE  BP 120/80   Pulse 84   Resp 16   Ht 5\' 5"  (1.651 m)   Wt 126 lb 1.9 oz (57.2 kg)   LMP 07/06/2012   SpO2 95%   BMI 20.99 kg/m   Patient alert and oriented and in no cardiopulmonary distress.  HEENT: No facial asymmetry, EOMI,   oropharynx pink and moist.  Neck supple no JVD, no mass.  Chest: Clear to auscultation bilaterally.  CVS: S1, S2 no murmurs, no S3.Regular rate.  ABD: Soft non tender.   Ext: No edema  MS: Adequate ROM spine, shoulders, hips and knees.  Skin: Intact, no ulcerations or rash noted.  Psych: Good eye contact, normal affect. Memory intact not anxious or depressed appearing.  CNS: CN 2-12 intact, power,  normal throughout.no focal deficits noted.   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   The need to take medication as prescribed, test blood sugar as directed, and to call between visits if there is a concern that blood sugar is uncontrolled is also discussed.   Wendy Oneill is reminded of the importance of daily foot exam, annual eye examination, and good blood sugar, blood pressure and cholesterol control.  Diabetic Labs Latest Ref Rng & Units 09/02/2015 03/25/2015 11/20/2014 07/25/2014 03/28/2014  HbA1c <5.7 % 6.8(H) 6.5(H) 6.8(H) 7.0(H) -  Microalbumin Not estab mg/dL - 0.3 - - <0.2  Micro/Creat Ratio <30 mcg/mg creat - 3 - - SEE NOTE  Chol 125 - 200 mg/dL 142 - 145 - -  HDL >=46 mg/dL 46 - 52 - -  Calc LDL <130 mg/dL 78 - 78 - -  Triglycerides <150 mg/dL 89 - 76 - -  Creatinine 0.50 - 1.05 mg/dL 0.87 0.71 0.71 0.67 -   BP/Weight 11/27/2015 11/06/2015 08/05/2015 05/16/2015 04/05/2015 12/19/2014 [id number]  Systolic BP 123456 XX123456 [id number] XX123456 XX123456 [id number] 91  Diastolic BP 80 66 64 63 80 60 63  Wt. (Lbs) 126.12 123.8 125 123 124 121 120.8  BMI 20.99 20.6 22.86 22.49 22.67 19.54 22.09   Foot/eye exam completion dates Latest Ref Rng & Units 05/21/2015 12/19/2014  Eye Exam No Retinopathy No Retinopathy -  Foot exam Order - - -  Foot Form Completion - - Done      Updated lab needed at/ before next visit.   Hyperlipidemia LDL goal <100 Hyperlipidemia:Low fat diet discussed and encouraged.   Lipid Panel  Lab Results  Component Value Date   CHOL 142 09/02/2015   HDL 46 09/02/2015   LDLCALC 78 09/02/2015   TRIG 89 09/02/2015   CHOLHDL 3.1 09/02/2015   Updated lab needed at/ before next visit.     Schizophrenia, unspecified type Managed by psychiatry and stable  Gastro-esophageal reflux Controlled, no change in medication   Allergic rhinitis Controlled, no change in medication

## 2015-12-01 NOTE — Assessment & Plan Note (Signed)
Controlled, no change in medication Wendy Oneill is reminded of the importance of commitment to daily physical activity for 30 minutes or more, as able and the need to limit carbohydrate intake to 30 to 60 grams per meal to help with blood sugar control.   The need to take medication as prescribed, test blood sugar as directed, and to call between visits if there is a concern that blood sugar is uncontrolled is also discussed.   Wendy Oneill is reminded of the importance of daily foot exam, annual eye examination, and good blood sugar, blood pressure and cholesterol control.  Diabetic Labs Latest Ref Rng & Units 09/02/2015 03/25/2015 11/20/2014 07/25/2014 03/28/2014  HbA1c <5.7 % 6.8(H) 6.5(H) 6.8(H) 7.0(H) -  Microalbumin Not estab mg/dL - 0.3 - - <0.2  Micro/Creat Ratio <30 mcg/mg creat - 3 - - SEE NOTE  Chol 125 - 200 mg/dL 142 - 145 - -  HDL >=46 mg/dL 46 - 52 - -  Calc LDL <130 mg/dL 78 - 78 - -  Triglycerides <150 mg/dL 89 - 76 - -  Creatinine 0.50 - 1.05 mg/dL 0.87 0.71 0.71 0.67 -   BP/Weight 11/27/2015 11/06/2015 08/05/2015 05/16/2015 04/05/2015 12/19/2014 [id number]  Systolic BP 123456 XX123456 [id number] XX123456 XX123456 [id number] 91  Diastolic BP 80 66 64 63 80 60 63  Wt. (Lbs) 126.12 123.8 125 123 124 121 120.8  BMI 20.99 20.6 22.86 22.49 22.67 19.54 22.09   Foot/eye exam completion dates Latest Ref Rng & Units 05/21/2015 12/19/2014  Eye Exam No Retinopathy No Retinopathy -  Foot exam Order - - -  Foot Form Completion - - Done      Updated lab needed at/ before next visit.

## 2015-12-01 NOTE — Progress Notes (Signed)
Wendy Oneill     MRN: [id number]      DOB: 07/02/1955   HPI Ms. Wendy Oneill is here for follow up and re-evaluation of chronic medical conditions, medication management and review of any available recent lab and radiology data.  Preventive health is updated, specifically  Cancer screening and Immunization.   Questions or concerns regarding consultations or procedures which the PT has had in the interim are  addressed. The PT denies any adverse reactions to current medications since the last visit.  There are no new concerns.  There are no specific complaints   ROS Denies recent fever or chills. Denies sinus pressure, nasal congestion, ear pain or sore throat. Denies chest congestion, productive cough or wheezing. Denies chest pains, palpitations and leg swelling Denies abdominal pain, nausea, vomiting,diarrhea or constipation.   Denies dysuria, frequency, hesitancy or incontinence. Denies joint pain, swelling and limitation in mobility. Denies headaches, seizures, numbness, or tingling. Denies depression, anxiety or insomnia. Denies skin break down or rash.   PE  BP 120/80   Pulse 84   Resp 16   Ht 5\' 5"  (1.651 m)   Wt 126 lb 1.9 oz (57.2 kg)   LMP 07/06/2012   SpO2 95%   BMI 20.99 kg/m   Patient alert and oriented and in no cardiopulmonary distress.  HEENT: No facial asymmetry, EOMI,   oropharynx pink and moist.  Neck supple no JVD, no mass.  Chest: Clear to auscultation bilaterally.  CVS: S1, S2 no murmurs, no S3.Regular rate.  ABD: Soft non tender.   Ext: No edema  MS: Adequate ROM spine, shoulders, hips and knees.  Skin: Intact, no ulcerations or rash noted.  Psych: Good eye contact, normal affect. Memory intact not anxious or depressed appearing.  CNS: CN 2-12 intact, power,  normal throughout.no focal deficits noted.   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 Wendy Oneill is reminded of the  importance of commitment to daily physical activity for 30 minutes or more, as able and the need to limit carbohydrate intake to 30 to 60 grams per meal to help with blood sugar control.   The need to take medication as prescribed, test blood sugar as directed, and to call between visits if there is a concern that blood sugar is uncontrolled is also discussed.   Ms. Wendy Oneill is reminded of the importance of daily foot exam, annual eye examination, and good blood sugar, blood pressure and cholesterol control.  Diabetic Labs Latest Ref Rng & Units 09/02/2015 03/25/2015 11/20/2014 07/25/2014 03/28/2014  HbA1c <5.7 % 6.8(H) 6.5(H) 6.8(H) 7.0(H) -  Microalbumin Not estab mg/dL - 0.3 - - <0.2  Micro/Creat Ratio <30 mcg/mg creat - 3 - - SEE NOTE  Chol 125 - 200 mg/dL 142 - 145 - -  HDL >=46 mg/dL 46 - 52 - -  Calc LDL <130 mg/dL 78 - 78 - -  Triglycerides <150 mg/dL 89 - 76 - -  Creatinine 0.50 - 1.05 mg/dL 0.87 0.71 0.71 0.67 -   BP/Weight 11/27/2015 11/06/2015 08/05/2015 05/16/2015 04/05/2015 12/19/2014 [id number]  Systolic BP 123456 XX123456 [id number] XX123456 XX123456 [id number] 91  Diastolic BP 80 66 64 63 80 60 63  Wt. (Lbs) 126.12 123.8 125 123 124 121 120.8  BMI 20.99 20.6 22.86 22.49 22.67 19.54 22.09   Foot/eye exam completion dates Latest Ref Rng & Units 05/21/2015 12/19/2014  Eye Exam No Retinopathy No Retinopathy -  Foot exam Order - - -  Foot Form Completion - - Done      Updated lab needed at/ before next visit.   Hyperlipidemia LDL goal <100 Hyperlipidemia:Low fat diet discussed and encouraged.   Lipid Panel  Lab Results  Component Value Date   CHOL 142 09/02/2015   HDL 46 09/02/2015   LDLCALC 78 09/02/2015   TRIG 89 09/02/2015   CHOLHDL 3.1 09/02/2015   Updated lab needed at/ before next visit.     Schizophrenia, unspecified type Managed by psychiatry and stable  Gastro-esophageal reflux Controlled, no change in medication   Allergic rhinitis Controlled, no change in medication

## 2015-12-06 ENCOUNTER — Other Ambulatory Visit: Payer: Self-pay | Admitting: Family Medicine

## 2015-12-16 ENCOUNTER — Ambulatory Visit (HOSPITAL_COMMUNITY): Payer: Medicaid Other

## 2015-12-23 ENCOUNTER — Other Ambulatory Visit: Payer: Self-pay | Admitting: Family Medicine

## 2016-01-01 LAB — CBC
HCT: 37.7 % (ref 35.0–45.0)
Hemoglobin: 12.3 g/dL (ref 11.7–15.5)
MCH: 30.2 pg (ref 27.0–33.0)
MCHC: 32.6 g/dL (ref 32.0–36.0)
MCV: 92.6 fL (ref 80.0–100.0)
MPV: 9.3 fL (ref 7.5–12.5)
Platelets: 340 10*3/uL (ref 140–400)
RBC: 4.07 MIL/uL (ref 3.80–5.10)
RDW: 13.2 % (ref 11.0–15.0)
WBC: 5.1 10*3/uL (ref 3.8–10.8)

## 2016-01-01 LAB — COMPLETE METABOLIC PANEL WITH GFR
ALK PHOS: 57 U/L (ref 33–130)
ALT: 16 U/L (ref 6–29)
AST: 20 U/L (ref 10–35)
Albumin: 3.8 g/dL (ref 3.6–5.1)
BILIRUBIN TOTAL: 0.2 mg/dL (ref 0.2–1.2)
BUN: 10 mg/dL (ref 7–25)
CO2: 28 mmol/L (ref 20–31)
Calcium: 9.6 mg/dL (ref 8.6–10.4)
Chloride: 104 mmol/L (ref 98–110)
Creat: 0.67 mg/dL (ref 0.50–1.05)
GLUCOSE: 147 mg/dL — AB (ref 65–99)
POTASSIUM: 4.3 mmol/L (ref 3.5–5.3)
SODIUM: 141 mmol/L (ref 135–146)
Total Protein: 6.5 g/dL (ref 6.1–8.1)

## 2016-01-01 LAB — TSH: TSH: 0.78 m[IU]/L

## 2016-01-01 LAB — VITAMIN D 25 HYDROXY (VIT D DEFICIENCY, FRACTURES): VIT D 25 HYDROXY: 32 ng/mL (ref 30–100)

## 2016-01-01 LAB — HEMOGLOBIN A1C
Hgb A1c MFr Bld: 6.8 % — ABNORMAL HIGH (ref <5.7)
MEAN PLASMA GLUCOSE: 148 mg/dL

## 2016-01-22 ENCOUNTER — Other Ambulatory Visit: Payer: Self-pay | Admitting: Family Medicine

## 2016-01-23 ENCOUNTER — Other Ambulatory Visit: Payer: Self-pay | Admitting: Family Medicine

## 2016-02-24 ENCOUNTER — Other Ambulatory Visit: Payer: Self-pay | Admitting: Family Medicine

## 2016-02-24 ENCOUNTER — Other Ambulatory Visit: Payer: Self-pay

## 2016-02-24 MED ORDER — CALCIUM CARBONATE-VITAMIN D 500-200 MG-UNIT PO TABS: ORAL_TABLET | ORAL | 5 refills | Status: DC

## 2016-04-01 ENCOUNTER — Encounter: Payer: Self-pay | Admitting: Family Medicine

## 2016-04-01 ENCOUNTER — Ambulatory Visit (INDEPENDENT_AMBULATORY_CARE_PROVIDER_SITE_OTHER): Payer: Medicaid Other | Admitting: Family Medicine

## 2016-04-01 VITALS — BP 120/82 | HR 87 | Resp 15 | Ht 65.0 in | Wt 128.0 lb

## 2016-04-01 DIAGNOSIS — Z1239 Encounter for other screening for malignant neoplasm of breast: Secondary | ICD-10-CM

## 2016-04-01 DIAGNOSIS — I10 Essential (primary) hypertension: Secondary | ICD-10-CM | POA: Diagnosis not present

## 2016-04-01 DIAGNOSIS — E785 Hyperlipidemia, unspecified: Secondary | ICD-10-CM | POA: Diagnosis not present

## 2016-04-01 DIAGNOSIS — L84 Corns and callosities: Secondary | ICD-10-CM | POA: Diagnosis not present

## 2016-04-01 DIAGNOSIS — Z1231 Encounter for screening mammogram for malignant neoplasm of breast: Secondary | ICD-10-CM

## 2016-04-01 DIAGNOSIS — E11628 Type 2 diabetes mellitus with other skin complications: Secondary | ICD-10-CM | POA: Diagnosis not present

## 2016-04-01 DIAGNOSIS — F209 Schizophrenia, unspecified: Secondary | ICD-10-CM | POA: Diagnosis not present

## 2016-04-01 NOTE — Addendum Note (Signed)
Addended by: Tula Nakayama E on: 04/01/2016 01:01 PM   Modules accepted: Orders

## 2016-04-01 NOTE — Assessment & Plan Note (Signed)
Controlled, no change in medication Wendy Oneill is reminded of the importance of commitment to daily physical activity for 30 minutes or more, as able and the need to limit carbohydrate intake to 30 to 60 grams per meal to help with blood sugar control.   The need to take medication as prescribed, test blood sugar as directed, and to call between visits if there is a concern that blood sugar is uncontrolled is also discussed.   Wendy Oneill is reminded of the importance of daily foot exam, annual eye examination, and good blood sugar, blood pressure and cholesterol control. Updated lab needed at/ before next visit.   Diabetic Labs Latest Ref Rng & Units 12/31/2015 09/02/2015 03/25/2015 11/20/2014 07/25/2014  HbA1c <5.7 % 6.8(H) 6.8(H) 6.5(H) 6.8(H) 7.0(H)  Microalbumin Not estab mg/dL - - 0.3 - -  Micro/Creat Ratio <30 mcg/mg creat - - 3 - -  Chol 125 - 200 mg/dL - 142 - 145 -  HDL >=46 mg/dL - 46 - 52 -  Calc LDL <130 mg/dL - 78 - 78 -  Triglycerides <150 mg/dL - 89 - 76 -  Creatinine 0.50 - 1.05 mg/dL 0.67 0.87 0.71 0.71 0.67   BP/Weight 04/01/2016 11/27/2015 11/06/2015 08/05/2015 05/16/2015 04/05/2015 25/42/7062  Systolic BP 376 283 151 761 607 371 062  Diastolic BP 82 80 66 64 63 80 60  Wt. (Lbs) 128 126.12 123.8 125 123 124 121  BMI 21.3 20.99 20.6 22.86 22.49 22.67 19.54   Foot/eye exam completion dates Latest Ref Rng & Units 04/01/2016 05/21/2015  Eye Exam No Retinopathy - No Retinopathy  Foot exam Order - - -  Foot Form Completion - Done -

## 2016-04-01 NOTE — Assessment & Plan Note (Signed)
Controlled, no change in medication Managed by psychiatry 

## 2016-04-01 NOTE — Assessment & Plan Note (Signed)
Hyperlipidemia:Low fat diet discussed and encouraged. Updated lab needed at/ before next visit.    Lipid Panel  Lab Results  Component Value Date   CHOL 142 09/02/2015   HDL 46 09/02/2015   LDLCALC 78 09/02/2015   TRIG 89 09/02/2015   CHOLHDL 3.1 09/02/2015

## 2016-04-01 NOTE — Assessment & Plan Note (Signed)
Controlled, no change in medication DASH diet and commitment to daily physical activity for a minimum of 30 minutes discussed and encouraged, as a part of hypertension management. The importance of attaining a healthy weight is also discussed.  BP/Weight 04/01/2016 11/27/2015 11/06/2015 08/05/2015 05/16/2015 04/05/2015 68/11/5724  Systolic BP 203 559 741 638 453 646 803  Diastolic BP 82 80 66 64 63 80 60  Wt. (Lbs) 128 126.12 123.8 125 123 124 121  BMI 21.3 20.99 20.6 22.86 22.49 22.67 19.54

## 2016-04-01 NOTE — Progress Notes (Signed)
Wendy Oneill     MRN: [id number]      DOB: [birth year]   HPI Wendy Oneill is here for follow up and re-evaluation of chronic medical conditions, medication management and review of any available recent lab and radiology data.  Preventive health is updated, specifically  Cancer screening and Immunization.   Questions or concerns regarding consultations or procedures which the PT has had in the interim are  addressed. The PT denies any adverse reactions to current medications since the last visit.  There are no new concerns.  There are no specific complaints  Denies polyuria, polydipsia, blurred vision , or hypoglycemic episodes.   ROS Denies recent fever or chills. Denies sinus pressure, nasal congestion, ear pain or sore throat. Denies chest congestion, productive cough or wheezing. Denies chest pains, palpitations and leg swelling Denies abdominal pain, nausea, vomiting,diarrhea or constipation.   Denies dysuria, frequency, hesitancy or incontinence. Denies joint pain, swelling and limitation in mobility. Denies headaches, seizures, numbness, or tingling. Denies depression, anxiety or insomnia. Denies skin break down or rash.   PE  BP 120/82   Pulse 87   Resp 15   Ht 5\' 5"  (1.651 m)   Wt 128 lb (58.1 kg)   LMP 07/06/2012   SpO2 96%   BMI 21.30 kg/m   Patient alert and oriented and in no cardiopulmonary distress.  HEENT: No facial asymmetry, EOMI,   oropharynx pink and moist.  Neck supple no JVD, no mass.  Chest: Clear to auscultation bilaterally.  CVS: S1, S2 no murmurs, no S3.Regular rate.  ABD: Soft non tender.   Ext: No edema  MS: Adequate ROM spine, shoulders, hips and knees.  Skin: Intact, no ulcerations or rash noted.  Psych: Good eye contact, normal affect. Memory intact not anxious or depressed appearing.  CNS: CN 2-12 intact, power,  normal throughout.no focal deficits noted.   Assessment & Plan  Essential hypertension Controlled, no change in  medication DASH diet and commitment to daily physical activity for a minimum of 30 minutes discussed and encouraged, as a part of hypertension management. The importance of attaining a healthy weight is also discussed.  BP/Weight 04/01/2016 11/27/2015 11/06/2015 08/05/2015 05/16/2015 04/05/2015 22/29/7989  Systolic BP 211 941 740 814 481 856 314  Diastolic BP 82 80 66 64 63 80 60  Wt. 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   The need to take medication as prescribed, test blood sugar as directed, and to call between visits if there is a concern that blood sugar is uncontrolled is also discussed.   Wendy Oneill is reminded of the importance of daily foot exam, annual eye examination, and good blood sugar, blood pressure and cholesterol control. Updated lab needed at/ before next visit.   Diabetic Labs Latest Ref Rng & Units 12/31/2015 09/02/2015 03/25/2015 11/20/2014 07/25/2014  HbA1c <5.7 % 6.8(H) 6.8(H) 6.5(H) 6.8(H) 7.0(H)  Microalbumin Not estab mg/dL - - 0.3 - -  Micro/Creat Ratio <30 mcg/mg creat - - 3 - -  Chol 125 - 200 mg/dL - 142 - 145 -  HDL >=46 mg/dL -  46 - 52 -  Calc LDL <130 mg/dL - 78 - 78 -  Triglycerides <150 mg/dL - 89 - 76 -  Creatinine 0.50 - 1.05 mg/dL 0.67 0.87 0.71 0.71 0.67   BP/Weight 04/01/2016 11/27/2015 11/06/2015 08/05/2015 05/16/2015 04/05/2015 46/65/9935  Systolic BP 701 779 390 300 923 300 762  Diastolic BP 82 80 66 64 63 80 60  Wt. (Lbs) 128 126.12 123.8 125 123 124 121  BMI 21.3 20.99 20.6 22.86 22.49 22.67 19.54   Foot/eye exam completion dates Latest Ref Rng & Units 04/01/2016 05/21/2015  Eye Exam No Retinopathy -  No Retinopathy  Foot exam Order - - -  Foot Form Completion - Done -        Hyperlipidemia LDL goal <100 Hyperlipidemia:Low fat diet discussed and encouraged. Updated lab needed at/ before next visit.    Lipid Panel  Lab Results  Component Value Date   CHOL 142 09/02/2015   HDL 46 09/02/2015   LDLCALC 78 09/02/2015   TRIG 89 09/02/2015   CHOLHDL 3.1 09/02/2015       Schizophrenia, unspecified type Controlled, no change in medication Managed by psychiatry

## 2016-04-01 NOTE — Patient Instructions (Addendum)
Physical exam July 18 or after call if you need me sooner.  Good exam today  You NEED a Mammogram, we will call with appointment info  Fasting lipid, cmp and eGFR and hBA1C first week in May  Thank you  for choosing [facility]. We consider it a privelige to serve you.  Delivering excellent health care in a caring and  compassionate way is our goal.  Partnering with you,  so that together we can achieve this goal is our strategy.

## 2016-04-21 ENCOUNTER — Other Ambulatory Visit: Payer: Self-pay | Admitting: Family Medicine

## 2016-05-06 ENCOUNTER — Ambulatory Visit (INDEPENDENT_AMBULATORY_CARE_PROVIDER_SITE_OTHER): Payer: Medicaid Other | Admitting: Gastroenterology

## 2016-05-06 ENCOUNTER — Other Ambulatory Visit: Payer: Self-pay | Admitting: Family Medicine

## 2016-05-06 ENCOUNTER — Encounter: Payer: Self-pay | Admitting: Gastroenterology

## 2016-05-06 VITALS — BP 109/74 | HR 96 | Temp 97.8°F | Ht 62.0 in | Wt 128.6 lb

## 2016-05-06 DIAGNOSIS — K219 Gastro-esophageal reflux disease without esophagitis: Secondary | ICD-10-CM | POA: Diagnosis not present

## 2016-05-06 NOTE — Assessment & Plan Note (Signed)
61 year old female doing well with Prilosec once daily. No alarm features. Return in 1 year to see Dr. Oneida Alar for routine follow-up.

## 2016-05-06 NOTE — Patient Instructions (Signed)
Continue Prilosec each morning, 30 minutes before breakfast.   We will see you back in 1 year!

## 2016-05-06 NOTE — Progress Notes (Signed)
cc'ed to pcp °

## 2016-05-06 NOTE — Progress Notes (Signed)
Referring Provider: Fayrene Helper, MD Primary Care Physician:  Tula Nakayama, MD Primary GI: Dr. Oneida Alar   Chief Complaint  Patient presents with  . Gastroesophageal Reflux    HPI:   Wendy Oneill is a 61 y.o. female presenting today with a history of GERD, here for routine follow-up. Next colonoscopy due in 2020.   Prilosec daily each morning. No dysphagia. Sometimes constipation but started drinking more water, which helped a whole lot.   Weight increased. Eating well. Feels good. Caretaker present and states "she has come a long way".   Past Medical History:  Diagnosis Date  . Adenoma May 2010   Simple : TCS   . Allergic rhinitis   . Diabetes mellitus, type 1   . GERD (gastroesophageal reflux disease)   . Hyperlipidemia   . Personal history of schizophrenia     Past Surgical History:  Procedure Laterality Date  . COLONOSCOPY  MAY 2010   SIMPLE ADENOMAS  . MULTIPLE TOOTH EXTRACTIONS  Feb 03, 2010    Current Outpatient Prescriptions  Medication Sig Dispense Refill  . ACCU-CHEK SOFTCLIX LANCETS lancets USE TO TEST ONCE DAILY. 100 each 3  . ASPIRIN LOW DOSE 81 MG EC tablet TAKE (1) TABLET BY MOUTH EACH MORNING. 100 tablet 3  . calcium-vitamin D (OSCAL WITH D) 500-200 MG-UNIT tablet TAKE (1) TABLET BY MOUTH (3) TIMES DAILY. 90 tablet 4  . COGENTIN 0.5 MG tablet TAKE (1) TABLET BY MOUTH TWICE DAILY. 60 each 2  . DOK 100 MG capsule TAKE (1) CAPSULE BY MOUTH TWICE DAILY. 60 capsule 0  . escitalopram (LEXAPRO) 10 MG tablet TAKE (1) TABLET BY MOUTH EACH MORNING. 30 tablet 0  . ferrous sulfate 325 (65 FE) MG tablet TAKE 1 TABLET BY MOUTH ONCE DAILY. 30 tablet 2  . fluticasone (FLONASE) 50 MCG/ACT nasal spray INHALE 2 SPRAYS INTO EACH NOSTRIL ONCE DAILY. 48 g 1  . glucose blood (ACCU-CHEK AVIVA PLUS) test strip USE TO TEST ONCE DAILY. DX E11.9 50 each 5  . lisinopril (PRINIVIL,ZESTRIL) 2.5 MG tablet Take 1 tablet (2.5 mg total) by mouth daily. 30 tablet 11  .  loratadine (CLARITIN) 10 MG tablet TAKE ONE TABLET BY MOUTH ONCE DAILY. 30 tablet 0  . lovastatin (MEVACOR) 20 MG tablet TAKE 1 TABLET BY MOUTH AT BEDTIME. 90 tablet 1  . metFORMIN (GLUCOPHAGE-XR) 500 MG 24 hr tablet TAKE 4 TABLETS (2000mg ) BY MOUTH WITH BREAKFAST. 120 tablet 5  . omeprazole (PRILOSEC) 20 MG capsule 1 po 30 mins prior to FIRST MEAL 90 capsule 3  . ranitidine (ZANTAC) 150 MG tablet TAKE 1 TABLET BY MOUTH TWICE DAILY. 180 tablet 1  . risperiDONE (RISPERDAL) 2 MG tablet Take 2 mg by mouth 3 (three) times daily. Take one tablet by mouth in the AM and two tablets in the PM    . Wheat Dextrin (BENEFIBER) POWD Take by mouth. 1 table spoon into food or beverage three times daily     No current facility-administered medications for this visit.     Allergies as of 05/06/2016 - Review Complete 05/06/2016  Allergen Reaction Noted  . Risperidone    . Sulfonamide derivatives  11/06/2009    Family History  Problem Relation Age of Onset  . Diabetes Mother   . Hypertension Mother   . Colon cancer Neg Hx     Social History   Social History  . Marital status: Single    Spouse name: N/A  . Number of children: N/A  .  Years of education: N/A   Occupational History  . pt on fixed income     Social History Main Topics  . Smoking status: Former Research scientist (life sciences)  . Smokeless tobacco: Never Used     Comment: Never smoked  . Alcohol use No  . Drug use: No  . Sexual activity: No   Other Topics Concern  . None   Social History Narrative    Pt  Has Rx for Diabetic shoes     Review of Systems: Gen: Denies fever, chills, anorexia. Denies fatigue, weakness, weight loss.  CV: Denies chest pain, palpitations, syncope, peripheral edema, and claudication. Resp: Denies dyspnea at rest, cough, wheezing, coughing up blood, and pleurisy. GI: see HPI  Derm: Denies rash, itching, dry skin Psych: Denies depression, anxiety, memory loss, confusion. No homicidal or suicidal ideation.  Heme: Denies  bruising, bleeding, and enlarged lymph nodes.  Physical Exam: BP 109/74   Pulse 96   Temp 97.8 F (36.6 C) (Oral)   Ht 5\' 2"  (1.575 m)   Wt 128 lb 9.6 oz (58.3 kg)   LMP 07/06/2012   BMI 23.52 kg/m  General:   Alert and oriented. No distress noted. Pleasant and cooperative.  Head:  Normocephalic and atraumatic. Eyes:  Conjuctiva clear without scleral icterus. Mouth:  Oral mucosa pink and moist.  Heart:  S1, S2 present without murmurs, rubs, or gallops. Regular rate and rhythm. Abdomen:  +BS, soft, non-tender and non-distended. No rebound or guarding. No HSM or masses noted. Msk:  Symmetrical without gross deformities. Normal posture. Extremities:  Without edema. Neurologic:  Alert and  oriented x4;  grossly normal neurologically. Psych:  Alert and cooperative. Normal mood and affect.

## 2016-05-19 ENCOUNTER — Other Ambulatory Visit: Payer: Self-pay | Admitting: Family Medicine

## 2016-05-20 ENCOUNTER — Other Ambulatory Visit: Payer: Self-pay | Admitting: Family Medicine

## 2016-05-25 LAB — COMPLETE METABOLIC PANEL WITH GFR
ALBUMIN: 3.8 g/dL (ref 3.6–5.1)
ALT: 14 U/L (ref 6–29)
AST: 18 U/L (ref 10–35)
Alkaline Phosphatase: 54 U/L (ref 33–130)
BUN: 7 mg/dL (ref 7–25)
CHLORIDE: 106 mmol/L (ref 98–110)
CO2: 30 mmol/L (ref 20–31)
Calcium: 10.1 mg/dL (ref 8.6–10.4)
Creat: 0.79 mg/dL (ref 0.50–1.05)
GFR, EST NON AFRICAN AMERICAN: 82 mL/min (ref >=60)
GFR, Est African American: >89 mL/min (ref >=60)
GLUCOSE: 133 mg/dL — AB (ref 65–99)
POTASSIUM: 4.9 mmol/L (ref 3.5–5.3)
SODIUM: 142 mmol/L (ref 135–146)
Total Bilirubin: 0.2 mg/dL (ref 0.2–1.2)
Total Protein: 6.7 g/dL (ref 6.1–8.1)

## 2016-05-25 LAB — LIPID PANEL
CHOL/HDL RATIO: 3.3 ratio (ref <5.0)
Cholesterol: 140 mg/dL (ref <200)
HDL: 43 mg/dL — AB (ref >50)
LDL CALC: 74 mg/dL (ref <100)
Triglycerides: 116 mg/dL (ref <150)
VLDL: 23 mg/dL (ref <30)

## 2016-05-26 LAB — HEMOGLOBIN A1C
Hgb A1c MFr Bld: 7 % — ABNORMAL HIGH (ref <5.7)
Mean Plasma Glucose: 154 mg/dL

## 2016-06-19 ENCOUNTER — Other Ambulatory Visit: Payer: Self-pay | Admitting: Family Medicine

## 2016-07-24 ENCOUNTER — Other Ambulatory Visit: Payer: Self-pay | Admitting: Family Medicine

## 2016-08-10 ENCOUNTER — Other Ambulatory Visit (HOSPITAL_COMMUNITY)
Admission: RE | Admit: 2016-08-10 | Discharge: 2016-08-10 | Disposition: A | Discharge status: Home / Self Care | Payer: Medicaid Other | Source: Ambulatory Visit | Attending: Family Medicine | Admitting: Family Medicine

## 2016-08-10 ENCOUNTER — Other Ambulatory Visit: Payer: Self-pay | Admitting: Family Medicine

## 2016-08-10 ENCOUNTER — Ambulatory Visit (INDEPENDENT_AMBULATORY_CARE_PROVIDER_SITE_OTHER): Payer: Medicaid Other | Admitting: Family Medicine

## 2016-08-10 VITALS — BP 110/70 | HR 108 | Resp 16 | Ht 62.0 in | Wt 127.1 lb

## 2016-08-10 DIAGNOSIS — N760 Acute vaginitis: Secondary | ICD-10-CM | POA: Diagnosis not present

## 2016-08-10 DIAGNOSIS — E785 Hyperlipidemia, unspecified: Secondary | ICD-10-CM | POA: Diagnosis not present

## 2016-08-10 DIAGNOSIS — I10 Essential (primary) hypertension: Secondary | ICD-10-CM | POA: Diagnosis not present

## 2016-08-10 DIAGNOSIS — Z1231 Encounter for screening mammogram for malignant neoplasm of breast: Secondary | ICD-10-CM

## 2016-08-10 DIAGNOSIS — L84 Corns and callosities: Secondary | ICD-10-CM | POA: Insufficient documentation

## 2016-08-10 DIAGNOSIS — E11628 Type 2 diabetes mellitus with other skin complications: Secondary | ICD-10-CM | POA: Insufficient documentation

## 2016-08-10 MED ORDER — FLUCONAZOLE 150 MG PO TABS: ORAL_TABLET | ORAL | 0 refills | Status: DC

## 2016-08-10 NOTE — Progress Notes (Signed)
   Wendy Oneill     MRN: [id number]      DOB: September 12, 1955   HPI Wendy Oneill is here 2 week h/o thick white vaginal discharge with itching , not sexually active, states sugar has been high, 132 and above   Denies polyuria, polydipsia, blurred vision , or hypoglycemic episodes.   ROS Denies recent fever or chills. Denies sinus pressure, nasal congestion, ear pain or sore throat. Denies chest congestion, productive cough or wheezing. Denies chest pains, palpitations and leg swelling   Denies dysuria, frequency, hesitancy or incontinence. Denies joint pain, swelling and limitation in mobility. Denies headaches, seizures, numbness, or tingling. Denies uncontrolled depression, anxiety or insomnia. Denies skin break down or rash.   PE  BP 110/70   Pulse (!) 108   Resp 16   Ht 5\' 2"  (1.575 m)   Wt 127 lb 1.9 oz (57.7 kg)   LMP 07/06/2012   SpO2 99%   BMI 23.25 kg/m   Patient alert and oriented and in no cardiopulmonary distress.  HEENT: No facial asymmetry, EOMI,   oropharynx pink and moist.  Neck supple no JVD, no mass.  Chest: Clear to auscultation bilaterally.  CVS: S1, S2 no murmurs, no S3.Regular rate.  ABD: Soft non tender.  GU: Thick white vaginal discharge , fishy odor, uterus absent, no adnexal tenderness or fullness. No visible genital ulcers  Ext: No edema  MS: Adequate ROM spine, shoulders, hips and knees.  Skin: Intact, no ulcerations or rash noted.    CNS: CN 2-12 intact, power,  normal throughout.no focal deficits noted.   Assessment & Plan Vaginitis and vulvovaginitis Symptomatic, likely has yeast infection Specimens sent fore testing, hSV2 requested also. Fluconazole # 1 sent , other treatment to follow  Essential hypertension Controlled, no change in medication DASH diet and commitment to daily physical activity for a minimum of 30 minutes discussed and encouraged, as a part of hypertension management. The importance of attaining a healthy weight  is also discussed.  BP/Weight 08/10/2016 05/06/2016 04/01/2016 11/27/2015 11/06/2015 08/05/2015 3/52/4818  Systolic BP 590 931 121 624 469 507 225  Diastolic BP 70 74 82 80 66 64 63  Wt. (Lbs) 127.12 128.6 128 126.12 123.8 125 123  BMI 23.25 23.52 21.3 20.99 20.6 22.86 22.49       Type 2 diabetes mellitus with pressure callus ([place]) Updated lab needed at/ before next visit.

## 2016-08-10 NOTE — Patient Instructions (Addendum)
F/u as before, call if you need me sooner  Please get mammogram appointment at checkout, need to keep this appointment  Hba1C , non fast chem 7 and EGFr 1 week before next visit   HSV 2 today  Specimens sent today to test for vaginal infection  Fluconazole one tablet sent in until we get results of tests sent  [facility] you feel better soon  Thank you  for choosing [facility]. We consider it a privelige to serve you.  Delivering excellent health care in a caring and  compassionate way is our goal.  Partnering with you,  so that together we can achieve this goal is our strategy.

## 2016-08-11 ENCOUNTER — Telehealth: Payer: Self-pay

## 2016-08-11 LAB — CERVICOVAGINAL ANCILLARY ONLY
CHLAMYDIA, DNA PROBE: NEGATIVE
NEISSERIA GONORRHEA: NEGATIVE
Wet Prep (BD Affirm): POSITIVE — AB

## 2016-08-12 ENCOUNTER — Other Ambulatory Visit: Payer: Self-pay | Admitting: Family Medicine

## 2016-08-13 NOTE — Telephone Encounter (Signed)
Close encounter 

## 2016-08-14 ENCOUNTER — Telehealth: Payer: Self-pay | Admitting: Family Medicine

## 2016-08-14 NOTE — Telephone Encounter (Signed)
Left message

## 2016-08-14 NOTE — Telephone Encounter (Signed)
Can not find the group home patient is at.

## 2016-08-14 NOTE — Telephone Encounter (Signed)
Pls call the number listed, if you speak with Marshe ask to spaeak also with someone responsible for her med administration siince she is in a facility  ?? pls ask

## 2016-08-14 NOTE — Telephone Encounter (Signed)
-----   Message from Fayrene Helper, MD sent at 08/12/2016  8:21 PM EDT ----- Pt lives in a group home,explain to pt and responsible party that she has an infection that needs to be treated. I have entered metronidazole one twice daily for 1 week, pls send this to her pharmacy to treat BV , pls send in after caregiver/pt is aware ?? pls ask

## 2016-08-18 NOTE — Telephone Encounter (Signed)
Left message  Letter sent

## 2016-08-19 ENCOUNTER — Ambulatory Visit (HOSPITAL_COMMUNITY): Payer: Medicaid Other

## 2016-08-23 ENCOUNTER — Encounter: Payer: Self-pay | Admitting: Family Medicine

## 2016-08-23 DIAGNOSIS — N760 Acute vaginitis: Secondary | ICD-10-CM | POA: Insufficient documentation

## 2016-08-23 NOTE — Assessment & Plan Note (Signed)
Symptomatic, likely has yeast infection Specimens sent fore testing, hSV2 requested also. Fluconazole # 1 sent , other treatment to follow

## 2016-08-23 NOTE — Assessment & Plan Note (Signed)
Controlled, no change in medication DASH diet and commitment to daily physical activity for a minimum of 30 minutes discussed and encouraged, as a part of hypertension management. The importance of attaining a healthy weight is also discussed.  BP/Weight 08/10/2016 05/06/2016 04/01/2016 11/27/2015 11/06/2015 08/05/2015 08/29/8865  Systolic BP 737 366 815 947 076 151 834  Diastolic BP 70 74 82 80 66 64 63  Wt. (Lbs) 127.12 128.6 128 126.12 123.8 125 123  BMI 23.25 23.52 21.3 20.99 20.6 22.86 22.49

## 2016-08-23 NOTE — Assessment & Plan Note (Signed)
Updated lab needed at/ before next visit.   

## 2016-08-25 ENCOUNTER — Other Ambulatory Visit: Payer: Self-pay | Admitting: Family Medicine

## 2016-08-31 LAB — BASIC METABOLIC PANEL WITH GFR
BUN: 8 mg/dL (ref 7–25)
CHLORIDE: 104 mmol/L (ref 98–110)
CO2: 26 mmol/L (ref 20–32)
Calcium: 9.7 mg/dL (ref 8.6–10.4)
Creat: 0.87 mg/dL (ref 0.50–1.05)
GFR, EST AFRICAN AMERICAN: 84 mL/min (ref >=60)
GFR, Est Non African American: 73 mL/min (ref >=60)
Glucose, Bld: 128 mg/dL — ABNORMAL HIGH (ref 65–99)
Potassium: 4.6 mmol/L (ref 3.5–5.3)
Sodium: 140 mmol/L (ref 135–146)

## 2016-09-01 ENCOUNTER — Ambulatory Visit (INDEPENDENT_AMBULATORY_CARE_PROVIDER_SITE_OTHER): Payer: Medicaid Other | Admitting: Family Medicine

## 2016-09-01 ENCOUNTER — Encounter: Payer: Self-pay | Admitting: Family Medicine

## 2016-09-01 ENCOUNTER — Other Ambulatory Visit (HOSPITAL_COMMUNITY)
Admission: AD | Admit: 2016-09-01 | Discharge: 2016-09-01 | Disposition: A | Discharge status: Home / Self Care | Payer: Medicaid Other | Source: Skilled Nursing Facility | Attending: Family Medicine | Admitting: Family Medicine

## 2016-09-01 ENCOUNTER — Other Ambulatory Visit: Payer: Self-pay | Admitting: Family Medicine

## 2016-09-01 VITALS — BP 112/64 | HR 105 | Temp 97.5°F | Resp 15 | Ht 62.0 in | Wt 126.8 lb

## 2016-09-01 DIAGNOSIS — Z1211 Encounter for screening for malignant neoplasm of colon: Secondary | ICD-10-CM

## 2016-09-01 DIAGNOSIS — Z0000 Encounter for general adult medical examination without abnormal findings: Secondary | ICD-10-CM

## 2016-09-01 DIAGNOSIS — L84 Corns and callosities: Secondary | ICD-10-CM

## 2016-09-01 DIAGNOSIS — E785 Hyperlipidemia, unspecified: Secondary | ICD-10-CM

## 2016-09-01 DIAGNOSIS — E11628 Type 2 diabetes mellitus with other skin complications: Secondary | ICD-10-CM | POA: Diagnosis not present

## 2016-09-01 LAB — HEMOCCULT GUIAC POC 1CARD (OFFICE): FECAL OCCULT BLD: NEGATIVE

## 2016-09-01 LAB — HEMOGLOBIN A1C
Hgb A1c MFr Bld: 6.9 % — ABNORMAL HIGH (ref <5.7)
MEAN PLASMA GLUCOSE: 151 mg/dL

## 2016-09-01 LAB — HSV 2 ANTIBODY, IGG: HSV 2 GLYCOPROTEIN G AB, IGG: 15.8 {index} — AB (ref <0.90)

## 2016-09-01 NOTE — Progress Notes (Signed)
    Wendy Oneill     MRN: [id number]      DOB: 04/10/1955  HPI: Patient is in for annual physical exam. No other health concerns are expressed or addressed at the visit. Recent labs, if available are reviewed. Immunization is reviewed , and  updated if needed.   PE: BP 112/64 (BP Location: Left Arm, Patient Position: Sitting, Cuff Size: Normal)   Pulse (!) 105   Temp (!) 97.5 F (36.4 C) (Other (Comment))   Resp 15   Ht 5\' 2"  (1.575 m)   Wt 126 lb 12 oz (57.5 kg)   LMP 07/06/2012   SpO2 98%   BMI 23.18 kg/m   Pleasant  female, alert and oriented x 3, in no cardio-pulmonary distress. Afebrile. HEENT No facial trauma or asymetry. Sinuses non tender.  Extra occullar muscles intact, pupils equally reactive to light. External ears normal, tympanic membranes clear. Oropharynx moist, no exudate. Neck: supple, no adenopathy,JVD or thyromegaly.No bruits.  Chest: Clear to ascultation bilaterally.No crackles or wheezes. Non tender to palpation  Breast: No asymetry,no masses or lumps. No tenderness. No nipple discharge or inversion. No axillary or supraclavicular adenopathy  Cardiovascular system; Heart sounds normal,  S1 and  S2 ,no S3.  No murmur, or thrill. Apical beat not displaced Peripheral pulses normal.  Abdomen: Soft, non tender, no organomegaly or masses. No bruits. Bowel sounds normal. No guarding, tenderness or rebound.  Rectal:  Normal sphincter tone. No rectal mass. Guaiac negative stool.  GU: Not examined   Musculoskeletal exam: Full ROM of spine, hips , shoulders and knees. No deformity ,swelling or crepitus noted. No muscle wasting or atrophy.   Neurologic: Cranial nerves 2 to 12 intact. Power, tone ,sensation and reflexes normal throughout. No disturbance in gait. No tremor.  Skin: Intact, no ulceration, erythema , scaling or rash noted. Pigmentation normal throughout  Psych; Normal mood and affect. Judgement and concentration  normal   Assessment & Plan:  Annual physical exam Annual exam as documented.  Immunization and cancer screening needs are specifically addressed at this visit.

## 2016-09-01 NOTE — Patient Instructions (Addendum)
F/U in 4 .5  months, call if you need me before  Excellent blood sugar and kidney function  Come in September for flu vaccine   Microalb today  Fasting lipid, cmp and eGFR and hBA1C 3 days before next visit  We will call your caregiver re the mammogram snce this is still not done 3 ears after due  Thank you  for choosing [facility]. We consider it a privelige to serve you.  Delivering excellent health care in a caring and  compassionate way is our goal.  Partnering with you,  so that together we can achieve this goal is our strategy.

## 2016-09-02 ENCOUNTER — Telehealth: Payer: Self-pay | Admitting: Family Medicine

## 2016-09-02 LAB — MICROALBUMIN / CREATININE URINE RATIO
CREATININE, UR: 84.3 mg/dL
Microalb Creat Ratio: <3.6 mg/g creat (ref 0.0–30.0)

## 2016-09-02 NOTE — Telephone Encounter (Signed)
-----   Message from Fayrene Helper, MD sent at 09/01/2016  4:46 PM EDT ----- pls call and eplain to pt that her vaginal stinging and burning is because of a viral infection and I am sending in 10 days of medicatio for her , send the acyclovit after you speak with her , this is entered

## 2016-09-02 NOTE — Telephone Encounter (Signed)
Called patient regarding message below. No answer, left generic message for patient to return call.   

## 2016-09-04 ENCOUNTER — Ambulatory Visit (HOSPITAL_COMMUNITY): Payer: Medicaid Other

## 2016-09-04 NOTE — Telephone Encounter (Signed)
Called patient regarding message below. No answer, left generic message for patient to return call.   

## 2016-09-04 NOTE — Assessment & Plan Note (Signed)
Annual exam as documented. . Immunization and cancer screening needs are specifically addressed at this visit.  

## 2016-09-11 ENCOUNTER — Ambulatory Visit (HOSPITAL_COMMUNITY): Payer: Medicaid Other

## 2016-09-24 ENCOUNTER — Ambulatory Visit (HOSPITAL_COMMUNITY)
Admission: RE | Admit: 2016-09-24 | Discharge: 2016-09-24 | Disposition: A | Discharge status: Home / Self Care | Payer: Medicaid Other | Source: Ambulatory Visit | Attending: Family Medicine | Admitting: Family Medicine

## 2016-09-24 DIAGNOSIS — Z1231 Encounter for screening mammogram for malignant neoplasm of breast: Secondary | ICD-10-CM | POA: Diagnosis not present

## 2016-09-28 ENCOUNTER — Other Ambulatory Visit: Payer: Self-pay | Admitting: Family Medicine

## 2016-09-28 NOTE — Telephone Encounter (Signed)
Seen 8 14 18

## 2016-09-30 ENCOUNTER — Encounter: Payer: Self-pay | Admitting: Family Medicine

## 2016-09-30 ENCOUNTER — Ambulatory Visit (INDEPENDENT_AMBULATORY_CARE_PROVIDER_SITE_OTHER): Payer: Medicaid Other | Admitting: Family Medicine

## 2016-09-30 VITALS — HR 50 | Temp 97.5°F | Resp 16 | Ht 62.0 in | Wt 128.2 lb

## 2016-09-30 DIAGNOSIS — L84 Corns and callosities: Secondary | ICD-10-CM

## 2016-09-30 DIAGNOSIS — E785 Hyperlipidemia, unspecified: Secondary | ICD-10-CM

## 2016-09-30 DIAGNOSIS — E11628 Type 2 diabetes mellitus with other skin complications: Secondary | ICD-10-CM

## 2016-09-30 DIAGNOSIS — Z23 Encounter for immunization: Secondary | ICD-10-CM

## 2016-09-30 DIAGNOSIS — K219 Gastro-esophageal reflux disease without esophagitis: Secondary | ICD-10-CM | POA: Diagnosis not present

## 2016-09-30 DIAGNOSIS — F209 Schizophrenia, unspecified: Secondary | ICD-10-CM | POA: Diagnosis not present

## 2016-09-30 NOTE — Patient Instructions (Signed)
F/u in December as before, call if you need me soponer  Flu vaccine today.  Please get labs already ordered for next visit  Thank you  for choosing [facility]. We consider it a privelige to serve you.  Delivering excellent health care in a caring and  compassionate way is our goal.  Partnering with you,  so that together we can achieve this goal is our strategy.

## 2016-09-30 NOTE — Assessment & Plan Note (Signed)
Controlled, no change in medication Wendy Oneill is reminded of the importance of commitment to daily physical activity for 30 minutes or more, as able and the need to limit carbohydrate intake to 30 to 60 grams per meal to help with blood sugar control.   The need to take medication as prescribed, test blood sugar as directed, and to call between visits if there is a concern that blood sugar is uncontrolled is also discussed.   Wendy Oneill is reminded of the importance of daily foot exam, annual eye examination, and good blood sugar, blood pressure and cholesterol control.  Diabetic Labs Latest Ref Rng & Units 09/01/2016 08/31/2016 05/25/2016 12/31/2015 09/02/2015  HbA1c <5.7 % - 6.9(H) 7.0(H) 6.8(H) 6.8(H)  Microalbumin Not Estab. ug/mL <3.0(H) - - - -  Micro/Creat Ratio 0.0 - 30.0 mg/g creat <3.6 - - - -  Chol <200 mg/dL - - 140 - 142  HDL >50 mg/dL - - 43(L) - 46  Calc LDL <100 mg/dL - - 74 - 78  Triglycerides <150 mg/dL - - 116 - 89  Creatinine 0.50 - 1.05 mg/dL - 0.87 0.79 0.67 0.87   BP/Weight 09/30/2016 09/01/2016 08/10/2016 05/06/2016 04/01/2016 11/27/2015 24/46/2863  Systolic BP - 817 711 657 903 833 383  Diastolic BP - 64 70 74 82 80 66  Wt. (Lbs) 128.25 126.75 127.12 128.6 128 126.12 123.8  BMI 23.46 23.18 23.25 23.52 21.3 20.99 20.6   Foot/eye exam completion dates Latest Ref Rng & Units 04/01/2016 05/21/2015  Eye Exam No Retinopathy - No Retinopathy  Foot exam Order - - -  Foot Form Completion - Done -

## 2016-09-30 NOTE — Assessment & Plan Note (Signed)
Treated by psychiatry and stable

## 2016-09-30 NOTE — Assessment & Plan Note (Addendum)
Treated with acyclovir successfully

## 2016-09-30 NOTE — Assessment & Plan Note (Signed)
Hyperlipidemia:Low fat diet discussed and encouraged.   Lipid Panel  Lab Results  Component Value Date   CHOL 140 05/25/2016   HDL 43 (L) 05/25/2016   LDLCALC 74 05/25/2016   TRIG 116 05/25/2016   CHOLHDL 3.3 05/25/2016   Controlled, no change in medication

## 2016-10-03 NOTE — Assessment & Plan Note (Signed)
Controlled, no change in medication  

## 2016-10-03 NOTE — Progress Notes (Signed)
Wendy Oneill     MRN: [id number]      DOB: [birth year]   HPI Wendy Oneill is here for follow up and re-evaluation of chronic medical conditions, medication management and review of any available recent lab and radiology data.  Preventive health is updated, specifically  Cancer screening and Immunization.   Questions or concerns regarding consultations or procedures which the PT has had in the interim are  addressed. The PT denies any adverse reactions to current medications since the last visit.  There are no new concerns.  There are no specific complaints  Here for review of med list and dorm completion  ROS Denies recent fever or chills. Denies sinus pressure, nasal congestion, ear pain or sore throat. Denies chest congestion, productive cough or wheezing. Denies chest pains, palpitations and leg swelling Denies abdominal pain, nausea, vomiting,diarrhea or constipation.   Denies dysuria, frequency, hesitancy or incontinence. Denies joint pain, swelling and limitation in mobility. Denies headaches, seizures, numbness, or tingling. Denies depression, anxiety or insomnia. Denies skin break down or rash.   PE  Pulse (!) 50   Temp (!) 97.5 F (36.4 C) (Other (Comment))   Resp 16   Ht 5\' 2"  (1.575 m)   Wt 128 lb 4 oz (58.2 kg)   LMP 07/06/2012   SpO2 97%   BMI 23.46 kg/m   Patient alert and oriented and in no cardiopulmonary distress.  HEENT: No facial asymmetry, EOMI,   oropharynx pink and moist.  Neck supple no JVD, no mass.  Chest: Clear to auscultation bilaterally.  CVS: S1, S2 no murmurs, no S3.Regular rate.  ABD: Soft non tender.   Ext: No edema  MS: Adequate ROM spine, shoulders, hips and knees.  Skin: Intact, no ulcerations or rash noted.  Psych: Good eye contact, normal affect. Memory intact not anxious or depressed appearing.  CNS: CN 2-12 intact, power,  normal throughout.no focal deficits noted.   Assessment & Plan  Type 2 diabetes mellitus with  pressure callus ([facility]) Controlled, no change in medication Wendy Oneill is reminded of the importance of commitment to daily physical activity for 30 minutes or more, as able and the need to limit carbohydrate intake to 30 to 60 grams per meal to help with blood sugar control.   The need to take medication as prescribed, test blood sugar as directed, and to call between visits if there is a concern that blood sugar is uncontrolled is also discussed.   Wendy Oneill is reminded of the importance of daily foot exam, annual eye examination, and good blood sugar, blood pressure and cholesterol control.  Diabetic Labs Latest Ref Rng & Units 09/01/2016 08/31/2016 05/25/2016 12/31/2015 09/02/2015  HbA1c <5.7 % - 6.9(H) 7.0(H) 6.8(H) 6.8(H)  Microalbumin Not Estab. ug/mL <3.0(H) - - - -  Micro/Creat Ratio 0.0 - 30.0 mg/g creat <3.6 - - - -  Chol <200 mg/dL - - 140 - 142  HDL >50 mg/dL - - 43(L) - 46  Calc LDL <100 mg/dL - - 74 - 78  Triglycerides <150 mg/dL - - 116 - 89  Creatinine 0.50 - 1.05 mg/dL - 0.87 0.79 0.67 0.87   BP/Weight 09/30/2016 09/01/2016 08/10/2016 05/06/2016 04/01/2016 11/27/2015 20/94/7096  Systolic BP - 283 662 947 654 650 354  Diastolic BP - 64 70 74 82 80 66  Wt. (Lbs) 128.25 126.75 127.12 128.6 128 126.12 123.8  BMI 23.46 23.18 23.25 23.52 21.3 20.99 20.6   Foot/eye exam completion dates Latest Ref Rng & Units  04/01/2016 05/21/2015  Eye Exam No Retinopathy - No Retinopathy  Foot exam Order - - -  Foot Form Completion - Done -        Vaginitis and vulvovaginitis Treated with acyclovir successfully  Hyperlipidemia LDL goal <100 Hyperlipidemia:Low fat diet discussed and encouraged.   Lipid Panel  Lab Results  Component Value Date   CHOL 140 05/25/2016   HDL 43 (L) 05/25/2016   LDLCALC 74 05/25/2016   TRIG 116 05/25/2016   CHOLHDL 3.3 05/25/2016   Controlled, no change in medication     Schizophrenia, unspecified type Treated by psychiatry and stable  Gastro-esophageal  reflux Controlled, no change in medication

## 2016-10-07 ENCOUNTER — Ambulatory Visit (INDEPENDENT_AMBULATORY_CARE_PROVIDER_SITE_OTHER): Payer: Medicaid Other | Admitting: Podiatry

## 2016-10-07 ENCOUNTER — Encounter: Payer: Self-pay | Admitting: Podiatry

## 2016-10-07 VITALS — BP 125/85 | HR 84

## 2016-10-07 DIAGNOSIS — E1142 Type 2 diabetes mellitus with diabetic polyneuropathy: Secondary | ICD-10-CM

## 2016-10-07 NOTE — Progress Notes (Signed)
   Subjective:    Patient ID: Wendy Oneill, female    DOB: 1956/01/04, 61 y.o.   MRN: [id number]  HPI this patient presents the office for a diabetic foot exam.  Patient is a diabetic and taking metformin.  She denies taking gabapentin.  She expresses that she has numbness and tingling noted in the bottom of both of her feet.  This extends from her midfoot into her toes.  She says this is been tingling now for the last few months.  She has provided no self treatment nor sought any professional help.  She presents the office today for an evaluation and treatment of her diabetic feet    Review of Systems  All other systems reviewed and are negative.      Objective:   Physical Exam GENERAL APPEARANCE: Alert, conversant. Appropriately groomed. No acute distress.  VASCULAR: Pedal pulses are  palpable at  [facility] and PT bilateral.  Capillary refill time is immediate to all digits,  Normal temperature gradient.   NEUROLOGIC: sensation is normal to 5.07 monofilament at 5/5 sites bilateral.  Light touch is intact bilateral, Muscle strength normal.  MUSCULOSKELETAL: acceptable muscle strength, tone and stability bilateral.  Intrinsic muscluature intact bilateral.  Rectus appearance of foot and digits noted bilateral. No evidence of any pain noted at the insertion of the plantar fascia both feet are in the center of the arch on both feet  DERMATOLOGIC: skin color, texture, and turgor are within normal limits.  No preulcerative lesions or ulcers  are seen, no interdigital maceration noted.  No open lesions present.  Digital nails are asymptomatic. No drainage noted.         Assessment & Plan:  Diabetic neuropathy bilaterally   IE  explained to this patient that she has nerves that have become poorly functional.  I told her she may benefit from gabapentin which should be prescribed by her medical doctor.  She did inquire about diabetic shoes but I told her that  she does not qualify for diabetic shoes  because she has no neurovascular or orthopedic pathology.  RTC prn   Gardiner Barefoot DPM

## 2016-12-08 ENCOUNTER — Telehealth: Payer: Self-pay | Admitting: *Deleted

## 2016-12-08 NOTE — Telephone Encounter (Signed)
I'm not sure who this is.

## 2016-12-08 NOTE — Telephone Encounter (Signed)
Pam Daily from [employer] [place] called left message regarding patient at [facility]. Pam has questions regarding this patient. Please call Pam at [phone]

## 2016-12-09 ENCOUNTER — Other Ambulatory Visit: Payer: Self-pay

## 2016-12-09 NOTE — Telephone Encounter (Signed)
They need to talk with you about her meds.  This is regarding a state survey in the [place] she is in.

## 2016-12-09 NOTE — Telephone Encounter (Signed)
D/c order faxed over for flonase due to non use

## 2017-01-05 ENCOUNTER — Encounter: Payer: Self-pay | Admitting: Family Medicine

## 2017-01-05 ENCOUNTER — Ambulatory Visit (INDEPENDENT_AMBULATORY_CARE_PROVIDER_SITE_OTHER): Payer: Medicaid Other | Admitting: Family Medicine

## 2017-01-05 VITALS — BP 110/80 | HR 100 | Resp 16 | Ht 62.0 in | Wt 128.1 lb

## 2017-01-05 DIAGNOSIS — K219 Gastro-esophageal reflux disease without esophagitis: Secondary | ICD-10-CM

## 2017-01-05 DIAGNOSIS — E11628 Type 2 diabetes mellitus with other skin complications: Secondary | ICD-10-CM

## 2017-01-05 DIAGNOSIS — I10 Essential (primary) hypertension: Secondary | ICD-10-CM | POA: Diagnosis not present

## 2017-01-05 DIAGNOSIS — F209 Schizophrenia, unspecified: Secondary | ICD-10-CM

## 2017-01-05 DIAGNOSIS — E785 Hyperlipidemia, unspecified: Secondary | ICD-10-CM | POA: Diagnosis not present

## 2017-01-05 DIAGNOSIS — L84 Corns and callosities: Secondary | ICD-10-CM | POA: Diagnosis not present

## 2017-01-05 LAB — MICROALBUMIN / CREATININE URINE RATIO
CREATININE, URINE: 142 mg/dL (ref 20–275)
MICROALB UR: 0.5 mg/dL
Microalb Creat Ratio: 4 mcg/mg creat (ref <30)

## 2017-01-05 LAB — COMPLETE METABOLIC PANEL WITH GFR
AG Ratio: 1.3 (calc) (ref 1.0–2.5)
ALKALINE PHOSPHATASE (APISO): 55 U/L (ref 33–130)
ALT: 16 U/L (ref 6–29)
AST: 18 U/L (ref 10–35)
Albumin: 3.8 g/dL (ref 3.6–5.1)
BUN: 9 mg/dL (ref 7–25)
CO2: 31 mmol/L (ref 20–32)
CREATININE: 0.8 mg/dL (ref 0.50–1.05)
Calcium: 9.8 mg/dL (ref 8.6–10.4)
Chloride: 107 mmol/L (ref 98–110)
GFR, Est African American: 94 mL/min/{1.73_m2} (ref >=60)
GFR, Est Non African American: 81 mL/min/{1.73_m2} (ref >=60)
GLUCOSE: 121 mg/dL — AB (ref 65–99)
Globulin: 3 g/dL (calc) (ref 1.9–3.7)
Potassium: 4.9 mmol/L (ref 3.5–5.3)
SODIUM: 143 mmol/L (ref 135–146)
Total Bilirubin: 0.3 mg/dL (ref 0.2–1.2)
Total Protein: 6.8 g/dL (ref 6.1–8.1)

## 2017-01-05 LAB — HEMOGLOBIN A1C
EAG (MMOL/L): 9 (calc)
Hgb A1c MFr Bld: 7.3 % of total Hgb — ABNORMAL HIGH (ref <5.7)
Mean Plasma Glucose: 163 (calc)

## 2017-01-05 LAB — LIPID PANEL
CHOL/HDL RATIO: 3.4 (calc) (ref <5.0)
Cholesterol: 142 mg/dL (ref <200)
HDL: 42 mg/dL — ABNORMAL LOW (ref >50)
LDL CHOLESTEROL (CALC): 84 mg/dL
NON-HDL CHOLESTEROL (CALC): 100 mg/dL (ref <130)
Triglycerides: 73 mg/dL (ref <150)

## 2017-01-05 NOTE — Patient Instructions (Signed)
F/u in end April, call if you need me sooner  Please stop drinking sodas and sweet tea, your blood sugar has increased. Only one day per week, you drink one serving of soda or sweet tea, otherwise excellent labs and exam    All the best for 2019!  Check cost of tdap at pharmacy as this is due   Non fast chem 7 and eGFR, and hBA1C 1 week before f/u

## 2017-01-11 ENCOUNTER — Encounter: Payer: Self-pay | Admitting: Family Medicine

## 2017-01-11 NOTE — Assessment & Plan Note (Signed)
Hyperlipidemia:Low fat diet discussed and encouraged.   Lipid Panel  Lab Results  Component Value Date   CHOL 142 01/04/2017   HDL 42 (L) 01/04/2017   LDLCALC 74 05/25/2016   TRIG 73 01/04/2017   CHOLHDL 3.4 01/04/2017    Controlled, no change in medication

## 2017-01-11 NOTE — Assessment & Plan Note (Signed)
Controlled, no change in medication  

## 2017-01-11 NOTE — Assessment & Plan Note (Signed)
Controlled, no change in medication DASH diet and commitment to daily physical activity for a minimum of 30 minutes discussed and encouraged, as a part of hypertension management. The importance of attaining a healthy weight is also discussed.  BP/Weight 01/05/2017 10/07/2016 09/30/2016 09/01/2016 08/10/2016 05/06/2016 0/25/8527  Systolic BP 782 423 - 536 144 315 400  Diastolic BP 80 85 - 64 70 74 82  Wt. (Lbs) 128.12 - 128.25 126.75 127.12 128.6 128  BMI 23.43 - 23.46 23.18 23.25 23.52 21.3

## 2017-01-11 NOTE — Assessment & Plan Note (Signed)
Managed by psychiatry , stable and controlled

## 2017-01-11 NOTE — Progress Notes (Signed)
RAYNELL UPTON     MRN: [id number]      DOB: [birth year]   HPI Ms. Gage is here for follow up and re-evaluation of chronic medical conditions, medication management and review of any available recent lab and radiology data.  Preventive health is updated, specifically  Cancer screening and Immunization.  Needs eye exam Questions or concerns regarding consultations or procedures which the PT has had in the interim are  addressed. The PT denies any adverse reactions to current medications since the last visit.  There are no new concerns.  There are no specific complaints  Denies polyuria, polydipsia, blurred vision , or hypoglycemic episodes.   ROS Denies recent fever or chills. Denies sinus pressure, nasal congestion, ear pain or sore throat. Denies chest congestion, productive cough or wheezing. Denies chest pains, palpitations and leg swelling Denies abdominal pain, nausea, vomiting,diarrhea or constipation.   Denies dysuria, frequency, hesitancy or incontinence. Denies joint pain, swelling and limitation in mobility. Denies headaches, seizures, numbness, or tingling. Denies depression, anxiety or insomnia. Denies skin break down or rash.   PE  BP 110/80   Pulse 100   Resp 16   Ht 5\' 2"  (1.575 m)   Wt 128 lb 1.9 oz (58.1 kg)   LMP 07/06/2012   SpO2 97%   BMI 23.43 kg/m   Patient alert and oriented and in no cardiopulmonary distress.  HEENT: No facial asymmetry, EOMI,   oropharynx pink and moist.  Neck supple no JVD, no mass.  Chest: Clear to auscultation bilaterally.  CVS: S1, S2 no murmurs, no S3.Regular rate.  ABD: Soft non tender.   Ext: No edema  MS: Adequate ROM spine, shoulders, hips and knees.  Skin: Intact, no ulcerations or rash noted.  Psych: Good eye contact, normal affect. Memory intact not anxious or depressed appearing.  CNS: CN 2-12 intact, power,  normal throughout.no focal deficits noted.   Assessment & Plan  Essential  hypertension Controlled, no change in medication DASH diet and commitment to daily physical activity for a minimum of 30 minutes discussed and encouraged, as a part of hypertension management. The importance of attaining a healthy weight is also discussed.  BP/Weight 01/05/2017 10/07/2016 09/30/2016 09/01/2016 08/10/2016 05/06/2016 9/79/8921  Systolic BP 194 174 - 081 448 185 631  Diastolic BP 80 85 - 64 70 74 82  Wt. (Lbs) 128.12 - 128.25 126.75 127.12 128.6 128  BMI 23.43 - 23.46 23.18 23.25 23.52 21.3       Type 2 diabetes mellitus with pressure callus (HCC) Deteriorated but still within adequate control, dietary modification only at this time Ms. Gancarz is reminded of the importance of commitment to daily physical activity for 30 minutes or more, as able and the need to limit carbohydrate intake to 30 to 60 grams per meal to help with blood sugar control.   The need to take medication as prescribed, test blood sugar as directed, and to call between visits if there is a concern that blood sugar is uncontrolled is also discussed.   Ms. Homen is reminded of the importance of daily foot exam, annual eye examination, and good blood sugar, blood pressure and cholesterol control.  Diabetic Labs Latest Ref Rng & Units 01/04/2017 09/01/2016 08/31/2016 05/25/2016 12/31/2015  HbA1c <5.7 % of total Hgb 7.3(H) - 6.9(H) 7.0(H) 6.8(H)  Microalbumin mg/dL 0.5 <3.0(H) - - -  Micro/Creat Ratio <30 mcg/mg creat 4 <3.6 - - -  Chol <200 mg/dL 142 - - 140 -  HDL >50  mg/dL 42(L) - - 43(L) -  Calc LDL <100 mg/dL - - - 74 -  Triglycerides <150 mg/dL 73 - - 116 -  Creatinine 0.50 - 1.05 mg/dL 0.80 - 0.87 0.79 0.67   BP/Weight 01/05/2017 10/07/2016 09/30/2016 09/01/2016 08/10/2016 05/06/2016 05/04/6061  Systolic BP 016 010 - 932 355 732 202  Diastolic BP 80 85 - 64 70 74 82  Wt. (Lbs) 128.12 - 128.25 126.75 127.12 128.6 128  BMI 23.43 - 23.46 23.18 23.25 23.52 21.3   Foot/eye exam completion dates Latest Ref Rng &  Units 04/01/2016 05/21/2015  Eye Exam No Retinopathy - No Retinopathy  Foot exam Order - - -  Foot Form Completion - Done -        Hyperlipidemia LDL goal <100 Hyperlipidemia:Low fat diet discussed and encouraged.   Lipid Panel  Lab Results  Component Value Date   CHOL 142 01/04/2017   HDL 42 (L) 01/04/2017   LDLCALC 74 05/25/2016   TRIG 73 01/04/2017   CHOLHDL 3.4 01/04/2017    Controlled, no change in medication    Gastro-esophageal reflux Controlled, no change in medication   Schizophrenia, unspecified type Managed by psychiatry , stable and controlled

## 2017-01-11 NOTE — Assessment & Plan Note (Signed)
Deteriorated but still within adequate control, dietary modification only at this time Ms. Wendy Oneill is reminded of the importance of commitment to daily physical activity for 30 minutes or more, as able and the need to limit carbohydrate intake to 30 to 60 grams per meal to help with blood sugar control.   The need to take medication as prescribed, test blood sugar as directed, and to call between visits if there is a concern that blood sugar is uncontrolled is also discussed.   Ms. Wendy Oneill is reminded of the importance of daily foot exam, annual eye examination, and good blood sugar, blood pressure and cholesterol control.  Diabetic Labs Latest Ref Rng & Units 01/04/2017 09/01/2016 08/31/2016 05/25/2016 12/31/2015  HbA1c <5.7 % of total Hgb 7.3(H) - 6.9(H) 7.0(H) 6.8(H)  Microalbumin mg/dL 0.5 <3.0(H) - - -  Micro/Creat Ratio <30 mcg/mg creat 4 <3.6 - - -  Chol <200 mg/dL 142 - - 140 -  HDL >50 mg/dL 42(L) - - 43(L) -  Calc LDL <100 mg/dL - - - 74 -  Triglycerides <150 mg/dL 73 - - 116 -  Creatinine 0.50 - 1.05 mg/dL 0.80 - 0.87 0.79 0.67   BP/Weight 01/05/2017 10/07/2016 09/30/2016 09/01/2016 08/10/2016 05/06/2016 1/61/0960  Systolic BP 454 098 - 119 147 829 562  Diastolic BP 80 85 - 64 70 74 82  Wt. (Lbs) 128.12 - 128.25 126.75 127.12 128.6 128  BMI 23.43 - 23.46 23.18 23.25 23.52 21.3   Foot/eye exam completion dates Latest Ref Rng & Units 04/01/2016 05/21/2015  Eye Exam No Retinopathy - No Retinopathy  Foot exam Order - - -  Foot Form Completion - Done -

## 2017-02-26 ENCOUNTER — Other Ambulatory Visit: Payer: Self-pay | Admitting: Family Medicine

## 2017-03-16 ENCOUNTER — Encounter: Payer: Self-pay | Admitting: Gastroenterology

## 2017-03-16 ENCOUNTER — Telehealth: Payer: Self-pay

## 2017-03-16 NOTE — Telephone Encounter (Signed)
Wendy Oneill from [facility] called asking for clarification on a med review form that was faxed back to them.  CB # [phone].

## 2017-03-18 ENCOUNTER — Other Ambulatory Visit: Payer: Self-pay

## 2017-03-18 NOTE — Telephone Encounter (Signed)
ok'd the change to calcium citrate with vitamin d at same dosing to pharmacist

## 2017-03-25 ENCOUNTER — Telehealth: Payer: Self-pay | Admitting: Family Medicine

## 2017-03-25 NOTE — Telephone Encounter (Signed)
[facility] is calling regarding Wendy Oneill , he is calling as the Escript is denied for calcium --please call Monica Martinez at [phone]

## 2017-03-25 NOTE — Telephone Encounter (Signed)
Verbal refill order given

## 2017-04-21 ENCOUNTER — Other Ambulatory Visit: Payer: Self-pay | Admitting: Family Medicine

## 2017-05-05 ENCOUNTER — Other Ambulatory Visit: Payer: Self-pay | Admitting: Family Medicine

## 2017-05-11 ENCOUNTER — Encounter: Payer: Self-pay | Admitting: Family Medicine

## 2017-05-11 ENCOUNTER — Ambulatory Visit (INDEPENDENT_AMBULATORY_CARE_PROVIDER_SITE_OTHER): Payer: Medicaid Other | Admitting: Family Medicine

## 2017-05-11 VITALS — BP 118/80 | HR 91 | Resp 16 | Ht 62.0 in | Wt 121.0 lb

## 2017-05-11 DIAGNOSIS — E11628 Type 2 diabetes mellitus with other skin complications: Secondary | ICD-10-CM

## 2017-05-11 DIAGNOSIS — I10 Essential (primary) hypertension: Secondary | ICD-10-CM | POA: Diagnosis not present

## 2017-05-11 DIAGNOSIS — E785 Hyperlipidemia, unspecified: Secondary | ICD-10-CM | POA: Diagnosis not present

## 2017-05-11 DIAGNOSIS — L84 Corns and callosities: Secondary | ICD-10-CM

## 2017-05-11 DIAGNOSIS — F209 Schizophrenia, unspecified: Secondary | ICD-10-CM

## 2017-05-11 NOTE — Progress Notes (Signed)
Wendy Oneill     MRN: [id number]      DOB: [birth year]   HPI Wendy Oneill is here for follow up and re-evaluation of chronic medical conditions, medication management and review of any available recent lab and radiology data.  Preventive health is updated, specifically  Cancer screening and Immunization.   . The PT denies any adverse reactions to current medications since the last visit.  There are no new concerns.  There are no specific complaints   ROS Denies recent fever or chills. Denies sinus pressure, nasal congestion, ear pain or sore throat. Denies chest congestion, productive cough or wheezing. Denies chest pains, palpitations and leg swelling Denies abdominal pain, nausea, vomiting,diarrhea or constipation.   Denies dysuria, frequency, hesitancy or incontinence. Denies joint pain, swelling and limitation in mobility. Denies headaches, seizures, numbness, or tingling. Denies uncontrolled  depression, anxiety or insomnia. Denies skin break down or rash. Denies polyuria, polydipsia, blurred vision , or hypoglycemic episodes.    PE  BP 118/80   Pulse 91   Resp 16   Ht 5\' 2"  (1.575 m)   Wt 121 lb (54.9 kg)   LMP 07/06/2012   SpO2 99%   BMI 22.13 kg/m   Patient alert and oriented and in no cardiopulmonary distress.  HEENT: No facial asymmetry, EOMI,   oropharynx pink and moist.  Neck supple no JVD, no mass.  Chest: Clear to auscultation bilaterally.  CVS: S1, S2 no murmurs, no S3.Regular rate.  ABD: Soft non tender.   Ext: No edema  MS: Adequate ROM spine, shoulders, hips and knees.  Skin: Intact, no ulcerations or rash noted.  Psych: Good eye contact, normal affect. Memory intact not anxious or depressed appearing.  CNS: CN 2-12 intact, power,  normal throughout.no focal deficits noted.   Assessment & Plan  Essential hypertension Controlled, no change in medication DASH diet and commitment to daily physical activity for a minimum of 30 minutes  discussed and encouraged, as a part of hypertension management. The importance of attaining a healthy weight is also discussed.  BP/Weight 05/11/2017 01/05/2017 10/07/2016 09/30/2016 09/01/2016 08/10/2016 6/31/4970  Systolic BP 263 785 885 - 027 741 287  Diastolic BP 80 80 85 - 64 70 74  Wt. (Lbs) 121 128.12 - 128.25 126.75 127.12 128.6  BMI 22.13 23.43 - 23.46 23.18 23.25 23.52       Type 2 diabetes mellitus with pressure callus ([place]) Wendy Oneill is reminded of the importance of commitment to daily physical activity for 30 minutes or more, as able and the need to limit carbohydrate intake to 30 to 60 grams per meal to help with blood sugar control.   The need to take medication as prescribed, test blood sugar as directed, and to call between visits if there is a concern that blood sugar is uncontrolled is also discussed.   Wendy Oneill is reminded of the importance of daily foot exam, annual eye examination, and good blood sugar, blood pressure and cholesterol control. Updated lab needed at/ before next visit.   Diabetic Labs Latest Ref Rng & Units 01/04/2017 09/01/2016 08/31/2016 05/25/2016 12/31/2015  HbA1c <5.7 % of total Hgb 7.3(H) - 6.9(H) 7.0(H) 6.8(H)  Microalbumin mg/dL 0.5 <3.0(H) - - -  Micro/Creat Ratio <30 mcg/mg creat 4 <3.6 - - -  Chol <200 mg/dL 142 - - 140 -  HDL >50 mg/dL 42(L) - - 43(L) -  Calc LDL mg/dL (calc) 84 - - 74 -  Triglycerides <150 mg/dL 73 - -  116 -  Creatinine 0.50 - 1.05 mg/dL 0.80 - 0.87 0.79 0.67   BP/Weight 05/11/2017 01/05/2017 10/07/2016 09/30/2016 09/01/2016 08/10/2016 4/65/0354  Systolic BP 656 812 751 - 700 174 944  Diastolic BP 80 80 85 - 64 70 74  Wt. (Lbs) 121 128.12 - 128.25 126.75 127.12 128.6  BMI 22.13 23.43 - 23.46 23.18 23.25 23.52   Foot/eye exam completion dates Latest Ref Rng & Units 05/11/2017 04/01/2016  Eye Exam No Retinopathy - -  Foot exam Order - - -  Foot Form Completion - Done Done        Hyperlipidemia LDL goal  <100 Hyperlipidemia:Low fat diet discussed and encouraged.   Lipid Panel  Lab Results  Component Value Date   CHOL 142 01/04/2017   HDL 42 (L) 01/04/2017   LDLCALC 84 01/04/2017   TRIG 73 01/04/2017   CHOLHDL 3.4 01/04/2017  Updated lab needed .      Schizophrenia, unspecified type Managed by mental health and stable

## 2017-05-11 NOTE — Patient Instructions (Addendum)
Physical exam in 5 monhts with MD  Fasting lipid, cmp and EGFr and hBa1C l  Within the next 7 days  Please ensure Carrye uses her nose spray for allergies EVERY day , as directed  You are being referred to eye Doctor for diabetic eye exam  You are being referred to Podiatrrist for diabetic footcare  It is important that you exercise regularly at least 30 minutes 5 times a week. If you develop chest pain, have severe difficulty breathing, or feel very tired, stop exercising immediately and seek medical attention  Eat alotof vwegetables and drink water for regular bowel movements

## 2017-05-16 ENCOUNTER — Encounter: Payer: Self-pay | Admitting: Family Medicine

## 2017-05-16 NOTE — Assessment & Plan Note (Signed)
Hyperlipidemia:Low fat diet discussed and encouraged.   Lipid Panel  Lab Results  Component Value Date   CHOL 142 01/04/2017   HDL 42 (L) 01/04/2017   LDLCALC 84 01/04/2017   TRIG 73 01/04/2017   CHOLHDL 3.4 01/04/2017  Updated lab needed .

## 2017-05-16 NOTE — Assessment & Plan Note (Signed)
Controlled, no change in medication DASH diet and commitment to daily physical activity for a minimum of 30 minutes discussed and encouraged, as a part of hypertension management. The importance of attaining a healthy weight is also discussed.  BP/Weight 05/11/2017 01/05/2017 10/07/2016 09/30/2016 09/01/2016 08/10/2016 5/52/0802  Systolic BP 233 612 244 - 975 300 511  Diastolic BP 80 80 85 - 64 70 74  Wt. (Lbs) 121 128.12 - 128.25 126.75 127.12 128.6  BMI 22.13 23.43 - 23.46 23.18 23.25 23.52

## 2017-05-16 NOTE — Assessment & Plan Note (Signed)
Ms. Deloach is reminded of the importance of commitment to daily physical activity for 30 minutes or more, as able and the need to limit carbohydrate intake to 30 to 60 grams per meal to help with blood sugar control.   The need to take medication as prescribed, test blood sugar as directed, and to call between visits if there is a concern that blood sugar is uncontrolled is also discussed.   Ms. Biebel is reminded of the importance of daily foot exam, annual eye examination, and good blood sugar, blood pressure and cholesterol control. Updated lab needed at/ before next visit.   Diabetic Labs Latest Ref Rng & Units 01/04/2017 09/01/2016 08/31/2016 05/25/2016 12/31/2015  HbA1c <5.7 % of total Hgb 7.3(H) - 6.9(H) 7.0(H) 6.8(H)  Microalbumin mg/dL 0.5 <3.0(H) - - -  Micro/Creat Ratio <30 mcg/mg creat 4 <3.6 - - -  Chol <200 mg/dL 142 - - 140 -  HDL >50 mg/dL 42(L) - - 43(L) -  Calc LDL mg/dL (calc) 84 - - 74 -  Triglycerides <150 mg/dL 73 - - 116 -  Creatinine 0.50 - 1.05 mg/dL 0.80 - 0.87 0.79 0.67   BP/Weight 05/11/2017 01/05/2017 10/07/2016 09/30/2016 09/01/2016 08/10/2016 3/55/7322  Systolic BP 025 427 062 - 376 283 151  Diastolic BP 80 80 85 - 64 70 74  Wt. (Lbs) 121 128.12 - 128.25 126.75 127.12 128.6  BMI 22.13 23.43 - 23.46 23.18 23.25 23.52   Foot/eye exam completion dates Latest Ref Rng & Units 05/11/2017 04/01/2016  Eye Exam No Retinopathy - -  Foot exam Order - - -  Foot Form Completion - Done Done

## 2017-05-16 NOTE — Assessment & Plan Note (Signed)
Managed by mental health and stable

## 2017-06-18 ENCOUNTER — Other Ambulatory Visit: Payer: Self-pay | Admitting: Family Medicine

## 2017-06-22 ENCOUNTER — Other Ambulatory Visit: Payer: Self-pay | Admitting: Family Medicine

## 2017-06-30 ENCOUNTER — Other Ambulatory Visit: Payer: Self-pay | Admitting: Family Medicine

## 2017-07-12 ENCOUNTER — Other Ambulatory Visit: Payer: Self-pay | Admitting: Family Medicine

## 2017-08-27 ENCOUNTER — Other Ambulatory Visit: Payer: Self-pay

## 2017-09-21 ENCOUNTER — Other Ambulatory Visit: Payer: Self-pay | Admitting: Family Medicine

## 2017-09-30 ENCOUNTER — Other Ambulatory Visit: Payer: Self-pay | Admitting: Family Medicine

## 2017-10-18 ENCOUNTER — Ambulatory Visit: Payer: Medicaid Other | Admitting: Family Medicine

## 2017-10-22 ENCOUNTER — Other Ambulatory Visit: Payer: Self-pay | Admitting: Family Medicine

## 2017-10-22 DIAGNOSIS — Z1231 Encounter for screening mammogram for malignant neoplasm of breast: Secondary | ICD-10-CM

## 2017-10-25 ENCOUNTER — Other Ambulatory Visit: Payer: Self-pay | Admitting: Family Medicine

## 2017-10-26 LAB — BASIC METABOLIC PANEL
BUN/Creatinine Ratio: 12 (ref 12–28)
BUN: 10 mg/dL (ref 8–27)
CALCIUM: 9.7 mg/dL (ref 8.7–10.3)
CO2: 24 mmol/L (ref 20–29)
CREATININE: 0.82 mg/dL (ref 0.57–1.00)
Chloride: 104 mmol/L (ref 96–106)
GFR calc Af Amer: 90 mL/min/{1.73_m2} (ref >59)
GFR, EST NON AFRICAN AMERICAN: 78 mL/min/{1.73_m2} (ref >59)
GLUCOSE: 114 mg/dL — AB (ref 65–99)
Potassium: 4.1 mmol/L (ref 3.5–5.2)
SODIUM: 142 mmol/L (ref 134–144)

## 2017-10-26 LAB — SPECIMEN STATUS REPORT

## 2017-10-26 LAB — HGB A1C W/O EAG: HEMOGLOBIN A1C: 6.7 % — AB (ref 4.8–5.6)

## 2017-11-04 ENCOUNTER — Encounter: Payer: Self-pay | Admitting: Family Medicine

## 2017-11-04 ENCOUNTER — Ambulatory Visit (INDEPENDENT_AMBULATORY_CARE_PROVIDER_SITE_OTHER): Payer: Medicaid Other | Admitting: Family Medicine

## 2017-11-04 VITALS — BP 120/80 | HR 96 | Resp 12 | Ht 62.0 in | Wt 123.0 lb

## 2017-11-04 DIAGNOSIS — E11628 Type 2 diabetes mellitus with other skin complications: Secondary | ICD-10-CM | POA: Diagnosis not present

## 2017-11-04 DIAGNOSIS — Z23 Encounter for immunization: Secondary | ICD-10-CM

## 2017-11-04 DIAGNOSIS — L84 Corns and callosities: Secondary | ICD-10-CM

## 2017-11-04 DIAGNOSIS — I10 Essential (primary) hypertension: Secondary | ICD-10-CM

## 2017-11-04 DIAGNOSIS — E785 Hyperlipidemia, unspecified: Secondary | ICD-10-CM

## 2017-11-04 DIAGNOSIS — F209 Schizophrenia, unspecified: Secondary | ICD-10-CM

## 2017-11-04 MED ORDER — METFORMIN HCL ER (OSM) 1000 MG PO TB24: 1000.0000 mg | ORAL_TABLET | Freq: Every day | ORAL | 5 refills | Status: DC

## 2017-11-04 NOTE — Patient Instructions (Signed)
Annual physical exam  Last  Week in January, call if you need me sooner  Fasting lipid, cmp and EGFr, hBA1C, CBC, CBC and TSH 5  To 7 days before January appointment  Flu vaccine today  Please get shingles vaccines #2 total  at pharmacy, check cost, she should get th first one end  Nov/ early December  Mammogram next week   Blood sugar much better , reduce dose of metformin to 10-00 mg total per day every morning one tablet at breakfast. This is what will be sent when you get next medication pack For this month , continue all medication as is  Excellent kidney function , and blood sugar improve and also excellent

## 2017-11-08 ENCOUNTER — Ambulatory Visit (HOSPITAL_COMMUNITY): Payer: Medicaid Other

## 2017-11-09 ENCOUNTER — Ambulatory Visit (INDEPENDENT_AMBULATORY_CARE_PROVIDER_SITE_OTHER): Payer: Medicaid Other | Admitting: Podiatry

## 2017-11-09 DIAGNOSIS — E1142 Type 2 diabetes mellitus with diabetic polyneuropathy: Secondary | ICD-10-CM

## 2017-11-09 NOTE — Patient Instructions (Signed)

## 2017-11-24 ENCOUNTER — Ambulatory Visit: Payer: Medicaid Other | Admitting: Podiatry

## 2017-11-24 ENCOUNTER — Ambulatory Visit (HOSPITAL_COMMUNITY): Payer: Medicaid Other

## 2017-11-27 ENCOUNTER — Encounter: Payer: Self-pay | Admitting: Family Medicine

## 2017-11-27 NOTE — Assessment & Plan Note (Signed)
Hyperlipidemia:Low fat diet discussed and encouraged.   Lipid Panel  Lab Results  Component Value Date   CHOL 142 01/04/2017   HDL 42 (L) 01/04/2017   LDLCALC 84 01/04/2017   TRIG 73 01/04/2017   CHOLHDL 3.4 01/04/2017  needs to increase exercise

## 2017-11-27 NOTE — Assessment & Plan Note (Signed)
Controlled and managed by psychiatry 

## 2017-11-27 NOTE — Progress Notes (Signed)
Wendy Oneill     MRN: [id number]      DOB: [birth year]   HPI Wendy Oneill is here for follow up and re-evaluation of chronic medical conditions, medication management and review of any available recent lab and radiology data.  Preventive health is updated, specifically  Cancer screening and Immunization.   Questions or concerns regarding consultations or procedures which the PT has had in the interim are  addressed. The PT denies any adverse reactions to current medications since the last visit.  There are Oneill new concerns.  There are Oneill specific complaints   ROS Denies recent fever or chills. Denies sinus pressure, nasal congestion, ear pain or sore throat. Denies chest congestion, productive cough or wheezing. Denies chest pains, palpitations and leg swelling Denies abdominal pain, nausea, vomiting,diarrhea or constipation.   Denies dysuria, frequency, hesitancy or incontinence. Denies joint pain, swelling and limitation in mobility. Denies headaches, seizures, numbness, or tingling. Denies depression, anxiety or insomnia. Denies skin break down or rash.   PE  BP 120/80 (BP Location: Left Arm, Patient Position: Sitting, Cuff Size: Normal)   Pulse 96   Resp 12   Ht 5\' 2"  (1.575 m)   Wt 123 lb 0.6 oz (55.8 kg)   LMP 07/06/2012   SpO2 99% Comment: room air  BMI 22.50 kg/m   Patient alert and oriented and in Oneill cardiopulmonary distress.  HEENT: Oneill facial asymmetry, EOMI,   oropharynx pink and moist.  Neck supple Oneill JVD, Oneill mass.  Chest: Clear to auscultation bilaterally.  CVS: S1, S2 Oneill murmurs, Oneill S3.Regular rate.  ABD: Soft non tender.   Ext: Oneill edema  MS: Adequate ROM spine, shoulders, hips and knees.  Skin: Intact, Oneill ulcerations or rash noted.  Psych: Good eye contact, normal affect. Memory impaired not anxious or depressed appearing.  CNS: CN 2-12 intact, power,  normal throughout.Oneill focal deficits noted.   Assessment & Plan  Type 2 diabetes mellitus with  pressure callus (HCC) Controlled, Oneill change in medication Wendy Oneill is reminded of the importance of commitment to daily physical activity for 30 minutes or more, as able and the need to limit carbohydrate intake to 30 to 60 grams per meal to help with blood sugar control.   The need to take medication as prescribed, test blood sugar as directed, and to call between visits if there is a concern that blood sugar is uncontrolled is also discussed.   Wendy Oneill is reminded of the importance of daily foot exam, annual eye examination, and good blood sugar, blood pressure and cholesterol control.  Diabetic Labs Latest Ref Rng & Units 10/25/2017 01/04/2017 09/01/2016 08/31/2016 05/25/2016  HbA1c 4.8 - 5.6 % 6.7(H) 7.3(H) - 6.9(H) 7.0(H)  Microalbumin mg/dL - 0.5 <3.0(H) - -  Micro/Creat Ratio <30 mcg/mg creat - 4 <3.6 - -  Chol <200 mg/dL - 142 - - 140  HDL >50 mg/dL - 42(L) - - 43(L)  Calc LDL mg/dL (calc) - 84 - - 74  Triglycerides <150 mg/dL - 73 - - 116  Creatinine 0.57 - 1.00 mg/dL 0.82 0.80 - 0.87 0.79   BP/Weight 11/04/2017 05/11/2017 01/05/2017 10/07/2016 09/30/2016 09/01/2016 7/85/8850  Systolic BP 277 412 878 676 - 720 947  Diastolic BP 80 80 80 85 - 64 70  Wt. (Lbs) 123.04 121 128.12 - 128.25 126.75 127.12  BMI 22.5 22.13 23.43 - 23.46 23.18 23.25   Foot/eye exam completion dates Latest Ref Rng & Units 05/11/2017 04/01/2016  Eye Exam Oneill  Retinopathy - -  Foot exam Order - - -  Foot Form Completion - Done Done        Hyperlipidemia LDL goal <100 Hyperlipidemia:Low fat diet discussed and encouraged.   Lipid Panel  Lab Results  Component Value Date   CHOL 142 01/04/2017   HDL 42 (L) 01/04/2017   LDLCALC 84 01/04/2017   TRIG 73 01/04/2017   CHOLHDL 3.4 01/04/2017  needs to increase exercise     Essential hypertension Controlled, Oneill change in medication DASH diet and commitment to daily physical activity for a minimum of 30 minutes discussed and encouraged, as a part of  hypertension management. The importance of attaining a healthy weight is also discussed.  BP/Weight 11/04/2017 05/11/2017 01/05/2017 10/07/2016 09/30/2016 09/01/2016 1/61/0960  Systolic BP 454 098 119 147 - 829 562  Diastolic BP 80 80 80 85 - 64 70  Wt. (Lbs) 123.04 121 128.12 - 128.25 126.75 127.12  BMI 22.5 22.13 23.43 - 23.46 23.18 23.25       Schizophrenia, unspecified type Controlled and managed by psychiatry

## 2017-11-27 NOTE — Assessment & Plan Note (Signed)
Controlled, no change in medication DASH diet and commitment to daily physical activity for a minimum of 30 minutes discussed and encouraged, as a part of hypertension management. The importance of attaining a healthy weight is also discussed.  BP/Weight 11/04/2017 05/11/2017 01/05/2017 10/07/2016 09/30/2016 09/01/2016 1/61/0960  Systolic BP 454 098 119 147 - 829 562  Diastolic BP 80 80 80 85 - 64 70  Wt. (Lbs) 123.04 121 128.12 - 128.25 126.75 127.12  BMI 22.5 22.13 23.43 - 23.46 23.18 23.25

## 2017-11-27 NOTE — Assessment & Plan Note (Signed)
Controlled, no change in medication Wendy Oneill is reminded of the importance of commitment to daily physical activity for 30 minutes or more, as able and the need to limit carbohydrate intake to 30 to 60 grams per meal to help with blood sugar control.   The need to take medication as prescribed, test blood sugar as directed, and to call between visits if there is a concern that blood sugar is uncontrolled is also discussed.   Wendy Oneill is reminded of the importance of daily foot exam, annual eye examination, and good blood sugar, blood pressure and cholesterol control.  Diabetic Labs Latest Ref Rng & Units 10/25/2017 01/04/2017 09/01/2016 08/31/2016 05/25/2016  HbA1c 4.8 - 5.6 % 6.7(H) 7.3(H) - 6.9(H) 7.0(H)  Microalbumin mg/dL - 0.5 <3.0(H) - -  Micro/Creat Ratio <30 mcg/mg creat - 4 <3.6 - -  Chol <200 mg/dL - 142 - - 140  HDL >50 mg/dL - 42(L) - - 43(L)  Calc LDL mg/dL (calc) - 84 - - 74  Triglycerides <150 mg/dL - 73 - - 116  Creatinine 0.57 - 1.00 mg/dL 0.82 0.80 - 0.87 0.79   BP/Weight 11/04/2017 05/11/2017 01/05/2017 10/07/2016 09/30/2016 09/01/2016 9/43/2761  Systolic BP 470 929 574 734 - 037 096  Diastolic BP 80 80 80 85 - 64 70  Wt. (Lbs) 123.04 121 128.12 - 128.25 126.75 127.12  BMI 22.5 22.13 23.43 - 23.46 23.18 23.25   Foot/eye exam completion dates Latest Ref Rng & Units 05/11/2017 04/01/2016  Eye Exam No Retinopathy - -  Foot exam Order - - -  Foot Form Completion - Done Done

## 2018-02-03 ENCOUNTER — Other Ambulatory Visit: Payer: Self-pay | Admitting: Family Medicine

## 2018-02-09 ENCOUNTER — Other Ambulatory Visit: Payer: Self-pay | Admitting: Family Medicine

## 2018-02-10 LAB — CBC/DIFF AMBIGUOUS DEFAULT
BASOS ABS: 0 10*3/uL (ref 0.0–0.2)
Basos: 1 %
EOS (ABSOLUTE): 0.2 10*3/uL (ref 0.0–0.4)
EOS: 5 %
HEMATOCRIT: 38.6 % (ref 34.0–46.6)
HEMOGLOBIN: 12.8 g/dL (ref 11.1–15.9)
Immature Grans (Abs): 0 10*3/uL (ref 0.0–0.1)
Immature Granulocytes: 0 %
LYMPHS: 45 %
Lymphocytes Absolute: 2.2 10*3/uL (ref 0.7–3.1)
MCH: 30.9 pg (ref 26.6–33.0)
MCHC: 33.2 g/dL (ref 31.5–35.7)
MCV: 93 fL (ref 79–97)
Monocytes Absolute: 0.6 10*3/uL (ref 0.1–0.9)
Monocytes: 12 %
NEUTROS ABS: 1.8 10*3/uL (ref 1.4–7.0)
Neutrophils: 37 %
Platelets: 324 10*3/uL (ref 150–450)
RBC: 4.14 x10E6/uL (ref 3.77–5.28)
RDW: 11.5 % — ABNORMAL LOW (ref 11.7–15.4)
WBC: 4.8 10*3/uL (ref 3.4–10.8)

## 2018-02-10 LAB — COMPREHENSIVE METABOLIC PANEL
ALBUMIN: 4.1 g/dL (ref 3.8–4.9)
ALT: 24 IU/L (ref 0–32)
AST: 25 IU/L (ref 0–40)
Albumin/Globulin Ratio: 1.4 (ref 1.2–2.2)
Alkaline Phosphatase: 65 IU/L (ref 39–117)
BUN / CREAT RATIO: 11 — AB (ref 12–28)
BUN: 10 mg/dL (ref 8–27)
Bilirubin Total: <0.2 mg/dL (ref 0.0–1.2)
CO2: 25 mmol/L (ref 20–29)
CREATININE: 0.89 mg/dL (ref 0.57–1.00)
Calcium: 10.4 mg/dL — ABNORMAL HIGH (ref 8.7–10.3)
Chloride: 101 mmol/L (ref 96–106)
GFR calc non Af Amer: 71 mL/min/{1.73_m2} (ref >59)
GFR, EST AFRICAN AMERICAN: 81 mL/min/{1.73_m2} (ref >59)
GLUCOSE: 130 mg/dL — AB (ref 65–99)
Globulin, Total: 3 g/dL (ref 1.5–4.5)
Potassium: 4.7 mmol/L (ref 3.5–5.2)
Sodium: 140 mmol/L (ref 134–144)
TOTAL PROTEIN: 7.1 g/dL (ref 6.0–8.5)

## 2018-02-10 LAB — TSH: TSH: 1.14 u[IU]/mL (ref 0.450–4.500)

## 2018-02-10 LAB — LIPID PANEL W/O CHOL/HDL RATIO
Cholesterol, Total: 153 mg/dL (ref 100–199)
HDL: 52 mg/dL (ref >39)
LDL CALC: 76 mg/dL (ref 0–99)
Triglycerides: 123 mg/dL (ref 0–149)
VLDL CHOLESTEROL CAL: 25 mg/dL (ref 5–40)

## 2018-02-10 LAB — HGB A1C W/O EAG: Hgb A1c MFr Bld: 7.4 % — ABNORMAL HIGH (ref 4.8–5.6)

## 2018-02-14 ENCOUNTER — Encounter: Payer: Medicaid Other | Admitting: Family Medicine

## 2018-03-16 ENCOUNTER — Ambulatory Visit (INDEPENDENT_AMBULATORY_CARE_PROVIDER_SITE_OTHER): Payer: Medicaid Other | Admitting: Family Medicine

## 2018-03-16 ENCOUNTER — Encounter (INDEPENDENT_AMBULATORY_CARE_PROVIDER_SITE_OTHER): Payer: Self-pay | Admitting: *Deleted

## 2018-03-16 ENCOUNTER — Encounter: Payer: Self-pay | Admitting: Family Medicine

## 2018-03-16 ENCOUNTER — Telehealth: Payer: Self-pay | Admitting: Family Medicine

## 2018-03-16 VITALS — BP 119/75 | HR 100 | Resp 12 | Ht 62.0 in | Wt 133.0 lb

## 2018-03-16 DIAGNOSIS — Z1211 Encounter for screening for malignant neoplasm of colon: Secondary | ICD-10-CM | POA: Diagnosis not present

## 2018-03-16 DIAGNOSIS — E11628 Type 2 diabetes mellitus with other skin complications: Secondary | ICD-10-CM | POA: Diagnosis not present

## 2018-03-16 DIAGNOSIS — Z1231 Encounter for screening mammogram for malignant neoplasm of breast: Secondary | ICD-10-CM | POA: Diagnosis not present

## 2018-03-16 DIAGNOSIS — I10 Essential (primary) hypertension: Secondary | ICD-10-CM | POA: Diagnosis not present

## 2018-03-16 DIAGNOSIS — M722 Plantar fascial fibromatosis: Secondary | ICD-10-CM

## 2018-03-16 DIAGNOSIS — Z23 Encounter for immunization: Secondary | ICD-10-CM | POA: Diagnosis not present

## 2018-03-16 DIAGNOSIS — L84 Corns and callosities: Secondary | ICD-10-CM

## 2018-03-16 MED ORDER — METFORMIN HCL ER (OSM) 500 MG PO TB24: 1500.0000 mg | ORAL_TABLET | Freq: Every day | ORAL | 1 refills | Status: DC

## 2018-03-16 NOTE — Progress Notes (Signed)
Established Patient Office Visit  Subjective:  Patient ID: Wendy Oneill, female    DOB: 1955-09-13  Age: 63 y.o. MRN: [id number]  CC:  Chief Complaint  Patient presents with  . Hypertension    follow up  . 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   Hypertension: Blood pressure well controlled today in the office.  Reports taking medications as directed.  Does not have any problems affording medications.  States that skilled facility, they help with medication management.  Denies having any blurred vision, chest pain, irregular heart rhythm, leg swelling, shortness of breath.  Denies having any headaches, fevers, chills, fatigue.  Diabetes: Most recent A1c drawn in January 2020.  A1c had increased from 6.7 in October 20 19-7.4.  Reports that she is taking her medications as ordered.  Reports that she loves to snack especially in the evening.  Does not really pay attention to the foods that she is eating just any kind of what she wants.  Reports that she does walk around, but is not on any form of exercise regime. Denies any polydipsia polyphagia poly-uria.  Facility helps manage her medications.  Denies having any blurred vision, sweating, shakiness, GI upset, confusion.  Overall, doing well today in the office.  Reports feeling well, does have some mild levels of depression with inability to fall asleep having little energy and overeating.  Reports she is taking her medications as provided and directed.  Denies having any other behavioral symptoms.  Caregiver from the facility present denies having any behavioral issues.  Does report having some left foot pain 8 out of 10.  Eases up when she goes to bed.  When walking it is worse.  Been going on for 2 weeks now.  When she first gets out of the bed it takes a few steps she feels it.  It is located on the bottom arch area of her foot and continues into her heel.  Reports that she has had diabetic  shoes in the past and would like to have diabetic shoes again and some orthotics thinking that this will help with this discomfort.  Health maintenance: Needs to have colonoscopy scheduled a mammogram scheduled.  Needs to get tetanus.  Follow-up with Dr. Moshe Cipro for Pap smear later this year.  Reports that they just saw an eye doctor at the facility.  Is unsure if this was an optometrist versus an ophthalmologist.  Past medical history, past surgical history, past social history, allergies, medications reviewed.  Past Medical History:  Diagnosis Date  . Adenoma May 2010   Simple : TCS   . Allergic rhinitis   . Diabetes mellitus, type 1   . GERD (gastroesophageal reflux disease)   . Hyperlipidemia   . Personal history of schizophrenia     Past Surgical History:  Procedure Laterality Date  . COLONOSCOPY  MAY 2010   SIMPLE ADENOMAS  . MULTIPLE TOOTH EXTRACTIONS  Feb 03, 2010    Family History  Problem Relation Age of Onset  . Diabetes Mother   . Hypertension Mother   . Colon cancer Neg Hx     Social History   Socioeconomic History  . Marital status: Single    Spouse name: Not on file  . Number of children: Not on file  . Years of education: Not on file  . Highest education level: Not on file  Occupational History  . Occupation: pt on fixed income  Social Needs  . Financial resource strain: Not on file  . Food insecurity:    Worry: Not on file    Inability: Not on file  . Transportation needs:    Medical: Not on file    Non-medical: Not on file  Tobacco Use  . Smoking status: Never Smoker  . Smokeless tobacco: Never Used  . Tobacco comment: Never smoked  Substance and Sexual Activity  . Alcohol use: No  . Drug use: No  . Sexual activity: Never  Lifestyle  . Physical activity:    Days per week: Not on file    Minutes per session: Not on file  . Stress: Not on file  Relationships  . Social connections:    Talks on phone: Not on file    Gets together: Not on  file    Attends religious service: Not on file    Active member of club or organization: Not on file    Attends meetings of clubs or organizations: Not on file    Relationship status: Not on file  . Intimate partner violence:    Fear of current or ex partner: Not on file    Emotionally abused: Not on file    Physically abused: Not on file    Forced sexual activity: Not on file  Other Topics Concern  . Not on file  Social History Narrative    Pt  Has Rx for Diabetic shoes     Outpatient Medications Prior to Visit  Medication Sig Dispense Refill  . ACCU-CHEK AVIVA PLUS test strip USE TO TEST ONCE DAILY. 50 each 0  . ACCU-CHEK SOFTCLIX LANCETS lancets USE TO TEST ONCE DAILY. 100 each 1  . ALLERGY RELIEF 10 MG tablet TAKE (1) TABLET BY MOUTH ONCE DAILY. 30 tablet 5  . ASPIRIN LOW DOSE 81 MG EC tablet TAKE (1) TABLET BY MOUTH EACH MORNING. 100 tablet 3  . COGENTIN 0.5 MG tablet TAKE (1) TABLET BY MOUTH TWICE DAILY. 60 each 2  . escitalopram (LEXAPRO) 10 MG tablet TAKE (1) TABLET BY MOUTH EACH MORNING. 30 tablet 0  . ferrous sulfate 325 (65 FE) MG tablet One tab daily  30 tablet 5  . lisinopril (PRINIVIL,ZESTRIL) 2.5 MG tablet TAKE 1 TABLET BY MOUTH ONCE DAILY. 90 tablet 1  . lovastatin (MEVACOR) 20 MG tablet TAKE 1 TABLET BY MOUTH AT BEDTIME. 90 tablet 1  . metformin (FORTAMET) 1000 MG (OSM) 24 hr tablet Take 1 tablet (1,000 mg total) by mouth daily with breakfast. 30 tablet 5  . omeprazole (PRILOSEC) 20 MG capsule TAKE 1 CAPSULE BY MOUTH ONCE DAILY 30 MINUTES BEFORE FIRST MEAL. 30 capsule 5  . ranitidine (ZANTAC) 150 MG tablet TAKE 1 TABLET BY MOUTH TWICE DAILY. 60 tablet 0  . risperiDONE (RISPERDAL) 2 MG tablet Take 2 mg by mouth 3 (three) times daily. Take one tablet by mouth in the AM and two tablets in the PM    . STOOL SOFTENER 100 MG capsule TAKE (1) CAPSULE BY MOUTH TWICE DAILY. 60 capsule 5   No facility-administered medications prior to visit.     Allergies  Allergen  Reactions  . Risperidone   . Sulfonamide Derivatives     ROS Review of Systems  Constitutional: Negative for activity change, appetite change, chills, fatigue and fever.  HENT: Negative for congestion, sinus pressure, sinus pain and sore throat.   Eyes:       Recently checked   Respiratory: Negative for cough, chest tightness and shortness of  breath.   Cardiovascular: Negative for chest pain, palpitations and leg swelling.  Gastrointestinal: Negative for blood in stool, constipation and diarrhea.  Endocrine: Negative.   Genitourinary: Negative for hematuria.  Musculoskeletal:       Foot pain  Neurological: Negative for dizziness and headaches.  Psychiatric/Behavioral: Positive for sleep disturbance. Negative for confusion and decreased concentration. The patient is not nervous/anxious and is not hyperactive.   All other systems reviewed and are negative.     Objective:    Physical Exam  Constitutional: She is oriented to person, place, and time. She appears well-developed and well-nourished.  HENT:  Head: Normocephalic.  Right Ear: External ear normal.  Left Ear: External ear normal.  Nose: Nose normal.  Mouth/Throat: Oropharynx is clear and moist.  Eyes: Conjunctivae are normal. Right eye exhibits no discharge. Left eye exhibits no discharge.  Cardiovascular: Normal rate and regular rhythm.  Pulmonary/Chest: Effort normal and breath sounds normal.  Musculoskeletal: Normal range of motion.     Right foot: Normal.     Left foot: Tenderness present.     Comments: Mild tenderness on bottom of foot into the heel  Neurological: She is alert and oriented to person, place, and time.  Skin: Skin is warm and dry.  Toe nails are long and mildly thick  Psychiatric: She has a normal mood and affect. Her behavior is normal. Judgment and thought content normal.  Nursing note and vitals reviewed.   BP 119/75   Pulse 100   Resp 12   Ht 5\' 2"  (1.575 m)   Wt 133 lb 0.6 oz (60.3 kg)    LMP 07/06/2012   SpO2 97% Comment: room air  BMI 24.33 kg/m  Wt Readings from Last 3 Encounters:  03/16/18 133 lb 0.6 oz (60.3 kg)  11/04/17 123 lb 0.6 oz (55.8 kg)  05/11/17 121 lb (54.9 kg)      Lab Results  Component Value Date   TSH 1.140 02/09/2018   Lab Results  Component Value Date   WBC 4.8 02/09/2018   HGB 12.8 02/09/2018   HCT 38.6 02/09/2018   MCV 93 02/09/2018   PLT 324 02/09/2018   Lab Results  Component Value Date   NA 140 02/09/2018   K 4.7 02/09/2018   CO2 25 02/09/2018   GLUCOSE 130 (H) 02/09/2018   BUN 10 02/09/2018   CREATININE 0.89 02/09/2018   BILITOT <0.2 02/09/2018   ALKPHOS 65 02/09/2018   AST 25 02/09/2018   ALT 24 02/09/2018   PROT 7.1 02/09/2018   ALBUMIN 4.1 02/09/2018   CALCIUM 10.4 (H) 02/09/2018   Lab Results  Component Value Date   CHOL 153 02/09/2018   Lab Results  Component Value Date   HDL 52 02/09/2018   Lab Results  Component Value Date   LDLCALC 76 02/09/2018   Lab Results  Component Value Date   TRIG 123 02/09/2018   Lab Results  Component Value Date   CHOLHDL 3.4 01/04/2017   Lab Results  Component Value Date   HGBA1C 7.4 (H) 02/09/2018      Assessment & Plan:   1. 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  Currently taking 1000 mg daily with breakfast of metformin.  Will increase this to 1500 mg daily.  Advised to start walking in a more consistent manner 30 minutes at a time at least 5 days a week.  Encouraged not to eat sweets or to  overeat after dinnertime.  Follow-up will be with Dr. Moshe Cipro in May.  Recheck of A1c can occur at that time.  Was asked to get A1c prior to the appointment.  We will have to get this through the facility.  - metformin (FORTAMET) 500 MG (OSM) 24 hr tablet; Take 3 tablets (1,500 mg total) by mouth daily with breakfast.  Dispense: 90 tablet; Refill: 1  2. Essential hypertension Blood pressure in the office  today is good reports taking medicines as ordered denies any trouble or signs or symptoms of having high blood pressure today in office.  Advised to continue current medication regimen.  3. Encounter for screening mammogram for breast cancer Is due for mammogram, missed doing this at the end of last year.  We will get this ordered today.  - MM 3D SCREEN BREAST BILATERAL; Future  4. Encounter for screening colonoscopy Is due for colonoscopy screening.  We will get this scheduled today.  - Ambulatory referral to Gastroenterology  5. Immunization due Was due for Tdap vaccine today provided this in the office today. - Tdap vaccine greater than or equal to 7yo IM  6. Plantar fasciitis, Left  Today in office signs and symptoms of left foot pain coincide with plantar fasciitis.  Will be ordering orthotics, diabetic shoes.  As she has had this in the past and reports that those helped.  Encouraged her to not overstrain her heel when walking.  Can get a tennis ball and rub it on the bottom of her heel to help massage out the fascia.  We will see if orthotics and shoes will help this heal.  Educated on the fact that plantar fasciitis does not usually go away and can flareup over time once it occurs.  Follow-up:   As needed, annual visit I believe is in May 3 months from now with Dr. Moshe Cipro.   Perlie Mayo, NP

## 2018-03-16 NOTE — Progress Notes (Signed)
TCS letter

## 2018-03-16 NOTE — Telephone Encounter (Signed)
Call [facility] for Mammo--Can schedule anytime between 9 and 4 Call Mrs Huel Cote to advise of the time

## 2018-03-16 NOTE — Patient Instructions (Addendum)
    Thank you for coming into the office today. I appreciate the opportunity to provide you with the care for your health and wellness.  We will schedule your mammogram, colonoscopy today.  You can take tylenol if arm is sore after the tetanus shot today.   Will need visit with Dr Moshe Cipro for annual/pap.  Call if you need help with diabetic shoes.  Please get toe nails trimmed.   Please get your [facility]  It was a pleasure to see you and I look forward to continuing to work together on your health and well-being. Please do not hesitate to call the office if you need care or have questions about your care.  Have a wonderful day and week.  With Gratitude,  Cherly Beach, DNP, AGNP-BC

## 2018-03-17 ENCOUNTER — Telehealth: Payer: Self-pay

## 2018-03-17 MED ORDER — UNABLE TO FIND: 0 refills | Status: AC

## 2018-03-17 NOTE — Telephone Encounter (Signed)
Thursday March 5th @ 10:30

## 2018-03-17 NOTE — Telephone Encounter (Signed)
Called [phone] couldn't leave a msg -mailbox is full,  I will try again

## 2018-03-22 ENCOUNTER — Other Ambulatory Visit: Payer: Self-pay

## 2018-03-22 ENCOUNTER — Other Ambulatory Visit: Payer: Self-pay | Admitting: Family Medicine

## 2018-03-22 ENCOUNTER — Telehealth: Payer: Self-pay | Admitting: *Deleted

## 2018-03-22 DIAGNOSIS — K219 Gastro-esophageal reflux disease without esophagitis: Secondary | ICD-10-CM

## 2018-03-22 MED ORDER — FAMOTIDINE 20 MG PO TABS: 20.0000 mg | ORAL_TABLET | Freq: Two times a day (BID) | ORAL | 3 refills | Status: DC

## 2018-03-22 NOTE — Telephone Encounter (Signed)
Anna with [facility] called and wanted to know if the famotidine 20 mg tablets were supposed to replace the ranitidine tablets that the patient was on or if she was to continue taking both of them? Wanted a call back at [phone]

## 2018-03-22 NOTE — Telephone Encounter (Signed)
Spoke with pharmacy to clarify order

## 2018-03-22 NOTE — Progress Notes (Signed)
Order for Pepcid 20mg  one by mouth two times daily added per Cherly Beach NP. 90 day supply sent with three refills to [facility]

## 2018-03-23 NOTE — Telephone Encounter (Signed)
Wendy Oneill is aware of the appt, and states she will need to resch, made sure she had to the correct #

## 2018-03-24 ENCOUNTER — Ambulatory Visit (HOSPITAL_COMMUNITY): Payer: Medicaid Other

## 2018-03-28 ENCOUNTER — Other Ambulatory Visit: Payer: Self-pay | Admitting: Family Medicine

## 2018-04-07 ENCOUNTER — Other Ambulatory Visit: Payer: Self-pay | Admitting: Family Medicine

## 2018-04-12 ENCOUNTER — Encounter: Payer: Self-pay | Admitting: Gastroenterology

## 2018-04-20 ENCOUNTER — Other Ambulatory Visit: Payer: Self-pay | Admitting: Family Medicine

## 2018-05-11 ENCOUNTER — Encounter: Payer: Self-pay | Admitting: Gastroenterology

## 2018-05-19 ENCOUNTER — Other Ambulatory Visit: Payer: Self-pay | Admitting: Family Medicine

## 2018-05-30 ENCOUNTER — Other Ambulatory Visit: Payer: Self-pay | Admitting: Family Medicine

## 2018-06-15 ENCOUNTER — Ambulatory Visit: Payer: Medicaid Other | Admitting: Family Medicine

## 2018-06-21 ENCOUNTER — Other Ambulatory Visit: Payer: Self-pay | Admitting: Family Medicine

## 2018-06-22 ENCOUNTER — Other Ambulatory Visit: Payer: Self-pay | Admitting: Family Medicine

## 2018-07-01 ENCOUNTER — Other Ambulatory Visit: Payer: Self-pay | Admitting: Family Medicine

## 2018-07-04 ENCOUNTER — Telehealth: Payer: Self-pay | Admitting: Family Medicine

## 2018-07-04 DIAGNOSIS — I10 Essential (primary) hypertension: Secondary | ICD-10-CM

## 2018-07-04 DIAGNOSIS — E785 Hyperlipidemia, unspecified: Secondary | ICD-10-CM

## 2018-07-04 DIAGNOSIS — E11628 Type 2 diabetes mellitus with other skin complications: Secondary | ICD-10-CM

## 2018-07-04 NOTE — Telephone Encounter (Signed)
Needs appt in office before paperwork signed. Needs labs ordered at [facility] do asap but 3 days before visit, microalb, fasting lipid, cmp and EGFr and hBA1C, Wendy Oneill is aware, pls calla dn arrnge

## 2018-07-04 NOTE — Telephone Encounter (Signed)
Labs ordered.

## 2018-07-04 NOTE — Addendum Note (Signed)
Addended by: Eual Fines on: 07/04/2018 01:56 PM   Modules accepted: Orders

## 2018-07-04 NOTE — Telephone Encounter (Signed)
Called [facility] gave her appt information and told her to get labs done before visit

## 2018-07-19 ENCOUNTER — Other Ambulatory Visit: Payer: Self-pay | Admitting: Family Medicine

## 2018-07-21 ENCOUNTER — Ambulatory Visit (INDEPENDENT_AMBULATORY_CARE_PROVIDER_SITE_OTHER): Payer: Medicaid Other | Admitting: Family Medicine

## 2018-07-21 ENCOUNTER — Encounter: Payer: Self-pay | Admitting: Family Medicine

## 2018-07-21 ENCOUNTER — Other Ambulatory Visit: Payer: Self-pay

## 2018-07-21 VITALS — BP 110/80 | HR 96 | Temp 97.9°F | Resp 15 | Ht 62.0 in | Wt 133.0 lb

## 2018-07-21 DIAGNOSIS — L84 Corns and callosities: Secondary | ICD-10-CM | POA: Diagnosis not present

## 2018-07-21 DIAGNOSIS — Z1211 Encounter for screening for malignant neoplasm of colon: Secondary | ICD-10-CM | POA: Diagnosis not present

## 2018-07-21 DIAGNOSIS — K219 Gastro-esophageal reflux disease without esophagitis: Secondary | ICD-10-CM

## 2018-07-21 DIAGNOSIS — F310 Bipolar disorder, current episode hypomanic: Secondary | ICD-10-CM | POA: Diagnosis not present

## 2018-07-21 DIAGNOSIS — E11628 Type 2 diabetes mellitus with other skin complications: Secondary | ICD-10-CM | POA: Diagnosis not present

## 2018-07-21 DIAGNOSIS — I10 Essential (primary) hypertension: Secondary | ICD-10-CM

## 2018-07-21 LAB — GLUCOSE, POCT (MANUAL RESULT ENTRY): POC Glucose: 172 mg/dl — AB (ref 70–99)

## 2018-07-21 NOTE — Patient Instructions (Addendum)
F/U in 4 months, call if you need me before  Please schedule mammogram at checkout, past due  you need  Your colonoscopy and the referral is made to Dr Oneida Alar   Medications are reviewed.  NEED blood work done next week fasting, urine to be tested at lab also

## 2018-07-21 NOTE — Progress Notes (Signed)
Wendy Oneill     MRN: [id number]      DOB: 04/29/1955   HPI Wendy Oneill is here for follow up and re-evaluation of chronic medical conditions, medication management and review of any available recent lab and radiology data.  Preventive health is updated, specifically  Cancer screening and Immunization.    The PT denies any adverse reactions to current medications since the last visit.  There are no new concerns.  There are no specific complaints   ROS Denies recent fever or chills. Denies sinus pressure, nasal congestion, ear pain or sore throat. Denies chest congestion, productive cough or wheezing. Denies chest pains, palpitations and leg swelling Denies abdominal pain, nausea, vomiting,diarrhea or constipation.   Denies dysuria, frequency, hesitancy or incontinence. C/o occasional back pain, denies , swelling and limitation in mobility affecting shoulders or  knees Denies headaches, seizures, numbness, or tingling. Denies uncontrolled depression, anxiety or insomnia. Denies skin break down or rash. Pt also here for medication reconciliation for her    PE  BP 110/80   Pulse 96   Temp 97.9 F (36.6 C) (Temporal)   Resp 15   Ht 5\' 2"  (1.575 m)   Wt 133 lb (60.3 kg)   LMP 07/06/2012   SpO2 97%   BMI 24.33 kg/m    Patient alert and oriented and in no cardiopulmonary distress.  HEENT: No facial asymmetry, EOMI,   oropharynx pink and moist.  Neck supple no JVD, no mass.  Chest: Clear to auscultation bilaterally.  CVS: S1, S2 no murmurs, no S3.Regular rate.  ABD: Soft non tender.   Ext: No edema  MS: Adequate ROM spine, shoulders, hips and knees.  Skin: Intact, no ulcerations or rash noted.  Psych: Good eye contact, normal affect. Memory intact not anxious or depressed appearing.  CNS: CN 2-12 intact, power,  normal throughout.no focal deficits noted.   Assessment & Plan Essential hypertension Controlled, no change in medication DASH diet and commitment to  daily physical activity for a minimum of 30 minutes discussed and encouraged, as a part of hypertension management. The importance of attaining a healthy weight is also discussed.  BP/Weight 07/21/2018 03/16/2018 11/04/2017 05/11/2017 01/05/2017 10/07/2016 7/32/2025  Systolic BP 427 062 376 283 151 761 -  Diastolic BP 80 75 80 80 80 85 -  Wt. (Lbs) 133 133.04 123.04 121 128.12 - 128.25  BMI 24.33 24.33 22.5 22.13 23.43 - 23.46       Type 2 diabetes mellitus with pressure callus ([place]) Wendy Oneill is reminded of the importance of commitment to daily physical activity for 30 minutes or more, as able and the need to limit carbohydrate intake to 30 to 60 grams per meal to help with blood sugar control.   The need to take medication as prescribed, test blood sugar as directed, and to call between visits if there is a concern that blood sugar is uncontrolled is also discussed.   Wendy Oneill is reminded of the importance of daily foot exam, annual eye examination, and good blood sugar, blood pressure and cholesterol control. Updated lab needed   Diabetic Labs Latest Ref Rng & Units 02/09/2018 10/25/2017 01/04/2017 09/01/2016 08/31/2016  HbA1c 4.8 - 5.6 % 7.4(H) 6.7(H) 7.3(H) - 6.9(H)  Microalbumin mg/dL - - 0.5 <3.0(H) -  Micro/Creat Ratio <30 mcg/mg creat - - 4 <3.6 -  Chol 100 - 199 mg/dL 153 - 142 - -  HDL >39 mg/dL 52 - 42(L) - -  Calc LDL 0 - 99  mg/dL 76 - 84 - -  Triglycerides 0 - 149 mg/dL 123 - 73 - -  Creatinine 0.57 - 1.00 mg/dL 0.89 0.82 0.80 - 0.87   BP/Weight 07/21/2018 03/16/2018 11/04/2017 05/11/2017 01/05/2017 10/07/2016 3/53/3174  Systolic BP 099 278 004 471 580 638 -  Diastolic BP 80 75 80 80 80 85 -  Wt. (Lbs) 133 133.04 123.04 121 128.12 - 128.25  BMI 24.33 24.33 22.5 22.13 23.43 - 23.46   Foot/eye exam completion dates Latest Ref Rng & Units 03/16/2018 05/11/2017  Eye Exam No Retinopathy - -  Foot exam Order - - -  Foot Form Completion - Done Done        Bipolar disorder,  current episode hypomanic ([place]) Managed by Psych and stable  Gastro-esophageal reflux Controlled, no change in medication

## 2018-07-24 ENCOUNTER — Encounter: Payer: Self-pay | Admitting: Family Medicine

## 2018-07-24 NOTE — Assessment & Plan Note (Signed)
Managed by Psych and stable 

## 2018-07-24 NOTE — Assessment & Plan Note (Signed)
Controlled, no change in medication DASH diet and commitment to daily physical activity for a minimum of 30 minutes discussed and encouraged, as a part of hypertension management. The importance of attaining a healthy weight is also discussed.  BP/Weight 07/21/2018 03/16/2018 11/04/2017 05/11/2017 01/05/2017 10/07/2016 8/44/1712  Systolic BP 787 183 672 550 016 429 -  Diastolic BP 80 75 80 80 80 85 -  Wt. (Lbs) 133 133.04 123.04 121 128.12 - 128.25  BMI 24.33 24.33 22.5 22.13 23.43 - 23.46

## 2018-07-24 NOTE — Assessment & Plan Note (Signed)
Controlled, no change in medication  

## 2018-07-24 NOTE — Assessment & Plan Note (Signed)
Wendy Oneill is reminded of the importance of commitment to daily physical activity for 30 minutes or more, as able and the need to limit carbohydrate intake to 30 to 60 grams per meal to help with blood sugar control.   The need to take medication as prescribed, test blood sugar as directed, and to call between visits if there is a concern that blood sugar is uncontrolled is also discussed.   Wendy Oneill is reminded of the importance of daily foot exam, annual eye examination, and good blood sugar, blood pressure and cholesterol control. Updated lab needed   Diabetic Labs Latest Ref Rng & Units 02/09/2018 10/25/2017 01/04/2017 09/01/2016 08/31/2016  HbA1c 4.8 - 5.6 % 7.4(H) 6.7(H) 7.3(H) - 6.9(H)  Microalbumin mg/dL - - 0.5 <3.0(H) -  Micro/Creat Ratio <30 mcg/mg creat - - 4 <3.6 -  Chol 100 - 199 mg/dL 153 - 142 - -  HDL >39 mg/dL 52 - 42(L) - -  Calc LDL 0 - 99 mg/dL 76 - 84 - -  Triglycerides 0 - 149 mg/dL 123 - 73 - -  Creatinine 0.57 - 1.00 mg/dL 0.89 0.82 0.80 - 0.87   BP/Weight 07/21/2018 03/16/2018 11/04/2017 05/11/2017 01/05/2017 10/07/2016 0/53/9767  Systolic BP 341 937 902 409 735 329 -  Diastolic BP 80 75 80 80 80 85 -  Wt. (Lbs) 133 133.04 123.04 121 128.12 - 128.25  BMI 24.33 24.33 22.5 22.13 23.43 - 23.46   Foot/eye exam completion dates Latest Ref Rng & Units 03/16/2018 05/11/2017  Eye Exam No Retinopathy - -  Foot exam Order - - -  Foot Form Completion - Done Done

## 2018-08-03 ENCOUNTER — Encounter: Payer: Self-pay | Admitting: Gastroenterology

## 2018-08-12 ENCOUNTER — Other Ambulatory Visit: Payer: Self-pay

## 2018-08-12 ENCOUNTER — Ambulatory Visit (INDEPENDENT_AMBULATORY_CARE_PROVIDER_SITE_OTHER): Payer: Medicaid Other | Admitting: Family Medicine

## 2018-08-12 ENCOUNTER — Encounter: Payer: Self-pay | Admitting: Family Medicine

## 2018-08-12 VITALS — BP 106/60 | HR 100 | Temp 98.6°F | Resp 12 | Ht 62.0 in | Wt 131.1 lb

## 2018-08-12 DIAGNOSIS — I10 Essential (primary) hypertension: Secondary | ICD-10-CM

## 2018-08-12 DIAGNOSIS — E11628 Type 2 diabetes mellitus with other skin complications: Secondary | ICD-10-CM

## 2018-08-12 DIAGNOSIS — L84 Corns and callosities: Secondary | ICD-10-CM | POA: Diagnosis not present

## 2018-08-12 NOTE — Patient Instructions (Signed)
    Thank you for coming into the office today. I appreciate the opportunity to provide you with the care for your health and wellness. Today we discussed: blood sugars running high  Follow Up: 4 weeks  Labs today  Pending lab results, we might need to change or add a medication for your diabetes.  Continue to take medication as directed. Continue to check blood sugar daily in the morning before eating.  Drink water daily 6-8 cups. Avoid sugars, breads, pastas, and snacks like chips.  Please continue to practice social distancing to keep you, your family, and our community safe.  If you must go out, please wear a Mask and practice good handwashing.  Plain YOUR HANDS WELL AND FREQUENTLY. AVOID TOUCHING YOUR FACE, UNLESS YOUR HANDS ARE FRESHLY WASHED.  GET FRESH AIR DAILY. STAY HYDRATED WITH WATER.   It was a pleasure to see you and I look forward to continuing to work together on your health and well-being. Please do not hesitate to call the office if you need care or have questions about your care.  Have a wonderful day and week. With Gratitude, Cherly Beach, DNP, AGNP-BC

## 2018-08-12 NOTE — Progress Notes (Signed)
Subjective:     Patient ID: Wendy Oneill, female   DOB: August 12, 1955, 63 y.o.   MRN: [id number]  Wendy Oneill presents for Diabetes (blood sugars have been running high)  Diabetes follow-up:  Blood sugars at home are running 160-300.  Denies hypoglycemia.  Denies polydipsia and polyuria. Last A1c 7.4. Denies non-healing wounds or rashes. Denies signs of UTI or other infections. Patient follows a low sugar diet and checks feet regularly without concerns. Reports staying hydrated by drinking water. Reports taking her medication as directed. Denies side effects. Recent change in metformin by Dr Moshe Cipro on 07/21/2018. She is taking 1500 mg XR every morning. Over the last week her BS results have improved with numbers as low as 134, 140, 166.   Today patient denies signs and symptoms of COVID 19 infection including fever, chills, cough, shortness of breath, and headache.  Past Medical, Surgical, Social History, Allergies, and Medications have been Reviewed.  Past Medical History:  Diagnosis Date  . Adenoma May 2010   Simple : TCS   . Allergic rhinitis   . Diabetes mellitus, type 1   . GERD (gastroesophageal reflux disease)   . Hyperlipidemia   . Personal history of schizophrenia    Past Surgical History:  Procedure Laterality Date  . COLONOSCOPY  MAY 2010   SIMPLE ADENOMAS  . MULTIPLE TOOTH EXTRACTIONS  Feb 03, 2010   Social History   Socioeconomic History  . Marital status: Single    Spouse name: Not on file  . Number of children: Not on file  . Years of education: Not on file  . Highest education level: Not on file  Occupational History  . Occupation: pt on fixed income   Social Needs  . Financial resource strain: Not on file  . Food insecurity    Worry: Not on file    Inability: Not on file  . Transportation needs    Medical: Not on file    Non-medical: Not on file  Tobacco Use  . Smoking status: Never Smoker  . Smokeless tobacco: Never Used  . Tobacco comment: Never  smoked  Substance and Sexual Activity  . Alcohol use: No  . Drug use: No  . Sexual activity: Never  Lifestyle  . Physical activity    Days per week: Not on file    Minutes per session: Not on file  . Stress: Not on file  Relationships  . Social Herbalist on phone: Not on file    Gets together: Not on file    Attends religious service: Not on file    Active member of club or organization: Not on file    Attends meetings of clubs or organizations: Not on file    Relationship status: Not on file  . Intimate partner violence    Fear of current or ex partner: Not on file    Emotionally abused: Not on file    Physically abused: Not on file    Forced sexual activity: Not on file  Other Topics Concern  . Not on file  Social History Narrative    Pt  Has Rx for Diabetic shoes     Outpatient Encounter Medications as of 08/12/2018  Medication Sig  . ACCU-CHEK AVIVA PLUS test strip USE TO TEST ONCE DAILY.  Marland Kitchen ACCU-CHEK SOFTCLIX LANCETS lancets USE TO TEST ONCE DAILY.  Marland Kitchen ALLERGY RELIEF 10 MG tablet TAKE (1) TABLET BY MOUTH ONCE DAILY.  Marland Kitchen ASPIRIN LOW DOSE 81  MG EC tablet TAKE (1) TABLET BY MOUTH EACH MORNING.  Marland Kitchen CALCIUM CITRATE + D3 MAXIMUM 315-250 MG-UNIT TABS TAKE (1) TABLET BY MOUTH (3) TIMES DAILY.  Marland Kitchen COGENTIN 0.5 MG tablet TAKE (1) TABLET BY MOUTH TWICE DAILY.  Marland Kitchen escitalopram (LEXAPRO) 10 MG tablet TAKE (1) TABLET BY MOUTH EACH MORNING.  . famotidine (PEPCID) 20 MG tablet Take 1 tablet (20 mg total) by mouth 2 (two) times daily.  . ferrous sulfate 325 (65 FE) MG tablet One daily   . lisinopril (ZESTRIL) 2.5 MG tablet TAKE 1 TABLET BY MOUTH ONCE DAILY.  Marland Kitchen lovastatin (MEVACOR) 20 MG tablet TAKE 1 TABLET BY MOUTH AT BEDTIME.  . metFORMIN (GLUCOPHAGE-XR) 500 MG 24 hr tablet TAKE 3 TABLETS (1500mg ) BY MOUTH ONCE DAILY WITH BREAKFAST.  Marland Kitchen omeprazole (PRILOSEC) 20 MG capsule TAKE 1 CAPSULE BY MOUTH ONCE DAILY 30 MINUTES BEFORE FIRST MEAL.  Marland Kitchen PAIN RELIEF EXTRA STRENGTH 500 MG tablet  TAKE 1 TABLET BY MOUTH ONCE DAILY AS NEEDED FOR PAIN.  Marland Kitchen risperiDONE (RISPERDAL) 2 MG tablet Take 2 mg by mouth 2 (two) times daily.   . STOOL SOFTENER 100 MG capsule TAKE (1) CAPSULE BY MOUTH TWICE DAILY.  Marland Kitchen UNABLE TO FIND Diabetic shoes with orthotic inserts (x 3) based on insurance coverage   No facility-administered encounter medications on file as of 08/12/2018.    Allergies  Allergen Reactions  . Risperidone   . Sulfonamide Derivatives     Review of Systems  Constitutional: Negative for chills and fever.  HENT: Negative.   Eyes: Positive for visual disturbance.  Respiratory: Negative.  Negative for cough and shortness of breath.   Cardiovascular: Negative.   Gastrointestinal: Negative.   Endocrine: Negative.   Genitourinary: Negative.   Musculoskeletal: Negative.   Skin: Negative.   Allergic/Immunologic: Negative.   Neurological: Positive for dizziness and headaches.  Hematological: Negative.   Psychiatric/Behavioral: Negative.   All other systems reviewed and are negative.      Objective:     BP 106/60   Pulse 100   Temp 98.6 F (37 C) (Oral)   Resp 12   Ht 5\' 2"  (1.575 m)   Wt 131 lb 1.3 oz (59.5 kg)   LMP 07/06/2012   SpO2 98%   BMI 23.97 kg/m   Physical Exam Vitals signs and nursing note reviewed.  Constitutional:      Appearance: Normal appearance. She is normal weight.  HENT:     Head: Normocephalic and atraumatic.     Right Ear: External ear normal.     Left Ear: External ear normal.     Nose: Nose normal.  Eyes:     General:        Right eye: No discharge.        Left eye: No discharge.     Conjunctiva/sclera: Conjunctivae normal.  Neck:     Musculoskeletal: Normal range of motion and neck supple.  Cardiovascular:     Rate and Rhythm: Normal rate and regular rhythm.     Pulses: Normal pulses.     Heart sounds: Normal heart sounds.  Pulmonary:     Breath sounds: Normal breath sounds.  Musculoskeletal: Normal range of motion.  Skin:     General: Skin is warm.     Capillary Refill: Capillary refill takes less than 2 seconds.  Neurological:     Mental Status: She is alert and oriented to person, place, and time.  Psychiatric:        Mood and Affect: Mood  normal.        Behavior: Behavior normal.        Thought Content: Thought content normal.        Judgment: Judgment normal.        Assessment and Plan        1. Type 2 diabetes mellitus with pressure callus (HCC) Ongoing elevation in BS checks at home. Range from 150-300's Started to show lower numbers this week with 134, 140, 166. Change in metformin on 07/21/2018 Taking 1500 mg XR in morning. Will continue and check a1c to see if  Adjustment is needed. Reeducated about DM diet. Patient acknowledged agreement and understanding of the plan.    - COMPLETE METABOLIC PANEL WITH GFR - Hemoglobin A1c  2. Essential hypertension Controlled, continue current medication regimen. No refills needed.  Follow Up: 09/12/2018  Perlie Mayo, DNP, AGNP-BC [address], [address], [place] [address] Office Hours: Mon-Thurs 8 am-5 pm; Fri 8 am-12 pm Office Phone:  [phone]  Office Fax: [phone]

## 2018-08-13 LAB — COMPLETE METABOLIC PANEL WITH GFR
AG Ratio: 1.5 (calc) (ref 1.0–2.5)
ALT: 21 U/L (ref 6–29)
AST: 27 U/L (ref 10–35)
Albumin: 4.1 g/dL (ref 3.6–5.1)
Alkaline phosphatase (APISO): 57 U/L (ref 37–153)
BUN/Creatinine Ratio: 7 (calc) (ref 6–22)
BUN: 6 mg/dL — ABNORMAL LOW (ref 7–25)
CO2: 28 mmol/L (ref 20–32)
Calcium: 10.4 mg/dL (ref 8.6–10.4)
Chloride: 105 mmol/L (ref 98–110)
Creat: 0.81 mg/dL (ref 0.50–0.99)
GFR, Est African American: 91 mL/min/{1.73_m2} (ref >=60)
GFR, Est Non African American: 78 mL/min/{1.73_m2} (ref >=60)
Globulin: 2.7 g/dL (calc) (ref 1.9–3.7)
Glucose, Bld: 140 mg/dL — ABNORMAL HIGH (ref 65–139)
Potassium: 4.2 mmol/L (ref 3.5–5.3)
Sodium: 140 mmol/L (ref 135–146)
Total Bilirubin: 0.3 mg/dL (ref 0.2–1.2)
Total Protein: 6.8 g/dL (ref 6.1–8.1)

## 2018-08-13 LAB — HEMOGLOBIN A1C
Hgb A1c MFr Bld: 8.3 % of total Hgb — ABNORMAL HIGH (ref <5.7)
Mean Plasma Glucose: 192 (calc)
eAG (mmol/L): 10.6 (calc)

## 2018-08-16 ENCOUNTER — Other Ambulatory Visit: Payer: Self-pay | Admitting: Family Medicine

## 2018-08-16 DIAGNOSIS — E11628 Type 2 diabetes mellitus with other skin complications: Secondary | ICD-10-CM

## 2018-08-16 MED ORDER — METFORMIN HCL ER 500 MG PO TB24: ORAL_TABLET | ORAL | 0 refills | Status: DC

## 2018-08-16 MED ORDER — METFORMIN HCL 1000 MG PO TABS: 1000.0000 mg | ORAL_TABLET | Freq: Two times a day (BID) | ORAL | 3 refills | Status: DC

## 2018-08-16 NOTE — Progress Notes (Signed)
A1c is elevated at 8.4% She reported taking medication as directed, but her diet must have changed in some fashion, as this is the highest she has been in years. Please remind her of the importance to diet and eating the best foods for a DM diet. Please review a list of foods she can safely have that will not raise blood sugar. She reported snacking, so make sure she is eating sugar free snacks. I will discuss with Dr Moshe Cipro if medication changes would be appropriate at this time verus trying to work on diet.

## 2018-08-23 ENCOUNTER — Other Ambulatory Visit: Payer: Self-pay | Admitting: Family Medicine

## 2018-08-24 ENCOUNTER — Ambulatory Visit: Payer: Medicaid Other | Admitting: Family Medicine

## 2018-08-24 ENCOUNTER — Telehealth: Payer: Self-pay | Admitting: *Deleted

## 2018-08-24 ENCOUNTER — Other Ambulatory Visit: Payer: Self-pay

## 2018-08-24 DIAGNOSIS — K219 Gastro-esophageal reflux disease without esophagitis: Secondary | ICD-10-CM

## 2018-08-24 MED ORDER — FAMOTIDINE 20 MG PO TABS: 20.0000 mg | ORAL_TABLET | Freq: Two times a day (BID) | ORAL | 1 refills | Status: DC

## 2018-08-24 NOTE — Telephone Encounter (Signed)
Butch Penny from [facility] called said she faxed over some refills and got everything back but the refill for famatodine. She was wondering if Jarrett Soho was going to refill that one also?

## 2018-08-24 NOTE — Telephone Encounter (Signed)
Prescription refilled and electronically sent to [facility]

## 2018-09-12 ENCOUNTER — Other Ambulatory Visit: Payer: Self-pay

## 2018-09-12 ENCOUNTER — Encounter: Payer: Self-pay | Admitting: Family Medicine

## 2018-09-12 ENCOUNTER — Ambulatory Visit (INDEPENDENT_AMBULATORY_CARE_PROVIDER_SITE_OTHER): Payer: Medicaid Other | Admitting: Family Medicine

## 2018-09-12 ENCOUNTER — Other Ambulatory Visit: Payer: Self-pay | Admitting: Family Medicine

## 2018-09-12 VITALS — BP 124/82 | HR 84 | Temp 98.2°F | Resp 15 | Ht 62.0 in | Wt 135.0 lb

## 2018-09-12 DIAGNOSIS — Z1211 Encounter for screening for malignant neoplasm of colon: Secondary | ICD-10-CM | POA: Diagnosis not present

## 2018-09-12 DIAGNOSIS — Z23 Encounter for immunization: Secondary | ICD-10-CM

## 2018-09-12 DIAGNOSIS — L84 Corns and callosities: Secondary | ICD-10-CM

## 2018-09-12 DIAGNOSIS — I10 Essential (primary) hypertension: Secondary | ICD-10-CM | POA: Diagnosis not present

## 2018-09-12 DIAGNOSIS — E11628 Type 2 diabetes mellitus with other skin complications: Secondary | ICD-10-CM | POA: Diagnosis not present

## 2018-09-12 DIAGNOSIS — F251 Schizoaffective disorder, depressive type: Secondary | ICD-10-CM

## 2018-09-12 DIAGNOSIS — E785 Hyperlipidemia, unspecified: Secondary | ICD-10-CM

## 2018-09-12 MED ORDER — SITAGLIPTIN PHOSPHATE 25 MG PO TABS: 25.0000 mg | ORAL_TABLET | Freq: Every day | ORAL | 5 refills | Status: DC

## 2018-09-12 NOTE — Assessment & Plan Note (Signed)
Uncontrolled , add glipizide 2.5 mg daily Ms. Traister is reminded of the importance of commitment to daily physical activity for 30 minutes or more, as able and the need to limit carbohydrate intake to 30 to 60 grams per meal to help with blood sugar control.   The need to take medication as prescribed, test blood sugar as directed, and to call between visits if there is a concern that blood sugar is uncontrolled is also discussed.   Ms. Asfour is reminded of the importance of daily foot exam, annual eye examination, and good blood sugar, blood pressure and cholesterol control.  Diabetic Labs Latest Ref Rng & Units 08/12/2018 02/09/2018 10/25/2017 01/04/2017 09/01/2016  HbA1c <5.7 % of total Hgb 8.3(H) 7.4(H) 6.7(H) 7.3(H) -  Microalbumin mg/dL - - - 0.5 <3.0(H)  Micro/Creat Ratio <30 mcg/mg creat - - - 4 <3.6  Chol 100 - 199 mg/dL - 153 - 142 -  HDL >39 mg/dL - 52 - 42(L) -  Calc LDL 0 - 99 mg/dL - 76 - 84 -  Triglycerides 0 - 149 mg/dL - 123 - 73 -  Creatinine 0.50 - 0.99 mg/dL 0.81 0.89 0.82 0.80 -   BP/Weight 09/12/2018 08/12/2018 07/21/2018 03/16/2018 11/04/2017 05/11/2017 [id number]  Systolic BP A999333 A999333 A999333 123456 123456 [id number]  Diastolic BP 82 60 80 75 80 80 80  Wt. (Lbs) 135 131.08 133 133.04 123.04 121 128.12  BMI 24.69 23.97 24.33 24.33 22.5 22.13 23.43   Foot/eye exam completion dates Latest Ref Rng & Units 03/16/2018 05/11/2017  Eye Exam No Retinopathy - -  Foot exam Order - - -  Foot Form Completion - Done Done      Updated lab needed at/ before next visit.

## 2018-09-12 NOTE — Patient Instructions (Signed)
Physical exam with pap with MD first week in [place] , call if you need me before  Flu vaccine today  Please keep mammogram appt  You are referred for eye exam and colonoscopy  Cut back on sugar and starchy foods please blood sugar too high New additional medication for blood sugar is januvia 25 mg once daily, continue metformin as before  Fasting lipid, cmp and EGFR and HBA1C Nov 23 or shortly after  Thanks for choosing [facility], we consider it a privelige to serve you.

## 2018-09-13 ENCOUNTER — Telehealth: Payer: Self-pay | Admitting: Family Medicine

## 2018-09-13 ENCOUNTER — Other Ambulatory Visit: Payer: Self-pay

## 2018-09-13 MED ORDER — ACCU-CHEK SOFTCLIX LANCETS MISC: 1 refills | Status: DC

## 2018-09-13 MED ORDER — ACCU-CHEK AVIVA PLUS VI STRP: ORAL_STRIP | 0 refills | Status: DC

## 2018-09-13 NOTE — Telephone Encounter (Signed)
Refilled and sent to pharmacy

## 2018-09-13 NOTE — Telephone Encounter (Signed)
Accu Check strips and lancets

## 2018-09-19 ENCOUNTER — Encounter: Payer: Self-pay | Admitting: Family Medicine

## 2018-09-19 NOTE — Assessment & Plan Note (Signed)
Stable and treated by Psychiatry

## 2018-09-19 NOTE — Progress Notes (Signed)
Wendy Oneill     MRN: [id number]      DOB: 1955-05-13   HPI Wendy Oneill is here for follow up and re-evaluation of chronic medical conditions, medication management and review of any available recent lab and radiology data.  Preventive health is updated, specifically  Cancer screening and Immunization.   Questions or concerns regarding consultations or procedures which the PT has had in the interim are  addressed. The PT denies any adverse reactions to current medications since the last visit.  There are no new concerns.  There are no specific complaints   ROS Denies recent fever or chills. Denies sinus pressure, nasal congestion, ear pain or sore throat. Denies chest congestion, productive cough or wheezing. Denies chest pains, palpitations and leg swelling Denies abdominal pain, nausea, vomiting,diarrhea or constipation.   Denies dysuria, frequency, hesitancy or incontinence. Denies joint pain, swelling and limitation in mobility. Denies headaches, seizures, numbness, or tingling. Denies depression, anxiety or insomnia. Denies skin break down or rash.   PE  BP 124/82   Pulse 84   Temp 98.2 F (36.8 C) (Temporal)   Resp 15   Ht 5\' 2"  (1.575 m)   Wt 135 lb (61.2 kg)   LMP 07/06/2012   SpO2 98%   BMI 24.69 kg/m   Patient alert and oriented and in no cardiopulmonary distress.  HEENT: No facial asymmetry, EOMI,   oropharynx pink and moist.  Neck supple no JVD, no mass.  Chest: Clear to auscultation bilaterally.  CVS: S1, S2 no murmurs, no S3.Regular rate.  ABD: Soft non tender.   Ext: No edema  MS: Adequate ROM spine, shoulders, hips and knees.  Skin: Intact, no ulcerations or rash noted.  Psych: Good eye contact, normal affect.  not anxious or depressed appearing.  CNS: CN 2-12 intact, power,  normal throughout.no focal deficits noted.   Assessment & Plan  Type 2 diabetes mellitus with pressure callus (HCC) Uncontrolled , add glipizide 2.5 mg daily Ms.  Oneill is reminded of the importance of commitment to daily physical activity for 30 minutes or more, as able and the need to limit carbohydrate intake to 30 to 60 grams per meal to help with blood sugar control.   The need to take medication as prescribed, test blood sugar as directed, and to call between visits if there is a concern that blood sugar is uncontrolled is also discussed.   Wendy Oneill is reminded of the importance of daily foot exam, annual eye examination, and good blood sugar, blood pressure and cholesterol control.  Diabetic Labs Latest Ref Rng & Units 08/12/2018 02/09/2018 10/25/2017 01/04/2017 09/01/2016  HbA1c <5.7 % of total Hgb 8.3(H) 7.4(H) 6.7(H) 7.3(H) -  Microalbumin mg/dL - - - 0.5 <3.0(H)  Micro/Creat Ratio <30 mcg/mg creat - - - 4 <3.6  Chol 100 - 199 mg/dL - 153 - 142 -  HDL >39 mg/dL - 52 - 42(L) -  Calc LDL 0 - 99 mg/dL - 76 - 84 -  Triglycerides 0 - 149 mg/dL - 123 - 73 -  Creatinine 0.50 - 0.99 mg/dL 0.81 0.89 0.82 0.80 -   BP/Weight 09/12/2018 08/12/2018 07/21/2018 03/16/2018 11/04/2017 05/11/2017 [id number]  Systolic BP A999333 A999333 A999333 123456 123456 123456 [id number]  Diastolic BP 82 60 80 75 80 80 80  Wt. (Lbs) 135 131.08 133 133.04 123.04 121 128.12  BMI 24.69 23.97 24.33 24.33 22.5 22.13 23.43   Foot/eye exam completion dates Latest Ref Rng & Units 03/16/2018 05/11/2017  Eye  Exam No Retinopathy - -  Foot exam Order - - -  Foot Form Completion - Done Done      Updated lab needed at/ before next visit.   Essential hypertension Controlled, no change in medication DASH diet and commitment to daily physical activity for a minimum of 30 minutes discussed and encouraged, as a part of hypertension management. The importance of attaining a healthy weight is also discussed.  BP/Weight 09/12/2018 08/12/2018 07/21/2018 03/16/2018 11/04/2017 05/11/2017 [id number]  Systolic BP A999333 A999333 A999333 123456 123456 123456 [id number]  Diastolic BP 82 60 80 75 80 80 80  Wt. (Lbs) 135 131.08 133 133.04 123.04 121 128.12   BMI 24.69 23.97 24.33 24.33 22.5 22.13 23.43       Hyperlipidemia LDL goal <100 Hyperlipidemia:Low fat diet discussed and encouraged.   Lipid Panel  Lab Results  Component Value Date   CHOL 153 02/09/2018   HDL 52 02/09/2018   LDLCALC 76 02/09/2018   TRIG 123 02/09/2018   CHOLHDL 3.4 01/04/2017   Updated lab needed at/ before next visit. Controlled when last checked    Schizoaffective disorder, depressive type ([place]) Stable and treated by Psychiatry

## 2018-09-19 NOTE — Assessment & Plan Note (Signed)
Hyperlipidemia:Low fat diet discussed and encouraged.   Lipid Panel  Lab Results  Component Value Date   CHOL 153 02/09/2018   HDL 52 02/09/2018   LDLCALC 76 02/09/2018   TRIG 123 02/09/2018   CHOLHDL 3.4 01/04/2017   Updated lab needed at/ before next visit. Controlled when last checked

## 2018-09-19 NOTE — Assessment & Plan Note (Signed)
Controlled, no change in medication DASH diet and commitment to daily physical activity for a minimum of 30 minutes discussed and encouraged, as a part of hypertension management. The importance of attaining a healthy weight is also discussed.  BP/Weight 09/12/2018 08/12/2018 07/21/2018 03/16/2018 11/04/2017 05/11/2017 [id number]  Systolic BP A999333 A999333 A999333 123456 123456 123456 [id number]  Diastolic BP 82 60 80 75 80 80 80  Wt. (Lbs) 135 131.08 133 133.04 123.04 121 128.12  BMI 24.69 23.97 24.33 24.33 22.5 22.13 23.43

## 2018-09-21 ENCOUNTER — Ambulatory Visit: Payer: Medicaid Other | Admitting: Gastroenterology

## 2018-09-21 ENCOUNTER — Encounter: Payer: Self-pay | Admitting: Gastroenterology

## 2018-09-21 ENCOUNTER — Telehealth: Payer: Self-pay | Admitting: Gastroenterology

## 2018-09-21 NOTE — Telephone Encounter (Signed)
PATIENT WAS A NO SHOW AND LETTER SENT  °

## 2018-09-24 ENCOUNTER — Other Ambulatory Visit: Payer: Self-pay | Admitting: Family Medicine

## 2018-10-03 ENCOUNTER — Ambulatory Visit (HOSPITAL_COMMUNITY): Payer: Medicaid Other

## 2018-10-12 ENCOUNTER — Encounter: Payer: Self-pay | Admitting: *Deleted

## 2018-10-12 ENCOUNTER — Other Ambulatory Visit: Payer: Self-pay | Admitting: *Deleted

## 2018-10-12 ENCOUNTER — Other Ambulatory Visit: Payer: Self-pay

## 2018-10-12 ENCOUNTER — Ambulatory Visit (INDEPENDENT_AMBULATORY_CARE_PROVIDER_SITE_OTHER): Payer: Medicaid Other | Admitting: Gastroenterology

## 2018-10-12 ENCOUNTER — Encounter: Payer: Self-pay | Admitting: Gastroenterology

## 2018-10-12 VITALS — BP 120/77 | HR 103 | Temp 96.2°F | Ht 62.0 in | Wt 132.8 lb

## 2018-10-12 DIAGNOSIS — K625 Hemorrhage of anus and rectum: Secondary | ICD-10-CM

## 2018-10-12 DIAGNOSIS — Z86010 Personal history of colonic polyps: Secondary | ICD-10-CM

## 2018-10-12 HISTORY — DX: Hemorrhage of anus and rectum: K62.5

## 2018-10-12 MED ORDER — CLENPIQ 10-3.5-12 MG-GM -GM/160ML PO SOLN: 1.0000 | Freq: Once | ORAL | 0 refills | Status: AC

## 2018-10-12 NOTE — Progress Notes (Addendum)
Primary Care Physician:  Fayrene Helper, MD  Primary Gastroenterologist:  Barney Drain, MD REVIEWED-NO ADDITIONAL RECOMMENDATIONS.  Chief Complaint  Patient presents with  . Colonoscopy  . Hemorrhoids    occ bleeding    HPI:  Wendy Oneill is a 63 y.o. female here to schedule colonoscopy.  Last colonoscopy in 2010, simple adenoma removed.  BM regular. Some rectal bleeding about twice per week. No rectal pain. No abdominal pain. No heartburn on omeprazole. No dysphagia. No weight loss. Appetite good.     Current Outpatient Medications  Medication Sig Dispense Refill  . Accu-Chek Softclix Lancets lancets USE TO TEST ONCE DAILY. 100 each 1  . ALLERGY RELIEF 10 MG tablet TAKE (1) TABLET BY MOUTH ONCE DAILY. 30 tablet 0  . ASPIRIN LOW DOSE 81 MG EC tablet TAKE (1) TABLET BY MOUTH EACH MORNING. 30 tablet 5  . CALCIUM CITRATE + D3 MAXIMUM 315-250 MG-UNIT TABS TAKE (1) TABLET BY MOUTH (3) TIMES DAILY. 90 tablet 0  . COGENTIN 0.5 MG tablet TAKE (1) TABLET BY MOUTH TWICE DAILY. 60 each 2  . docusate sodium (COLACE) 100 MG capsule TAKE (1) CAPSULE BY MOUTH TWICE DAILY. 60 capsule 0  . escitalopram (LEXAPRO) 10 MG tablet TAKE (1) TABLET BY MOUTH EACH MORNING. 30 tablet 0  . famotidine (PEPCID) 20 MG tablet Take 1 tablet (20 mg total) by mouth 2 (two) times daily. 180 tablet 1  . ferrous sulfate 325 (65 FE) MG tablet One daily  30 tablet 5  . glucose blood (ACCU-CHEK AVIVA PLUS) test strip USE TO TEST ONCE DAILY. 100 each 0  . lisinopril (ZESTRIL) 2.5 MG tablet TAKE 1 TABLET BY MOUTH ONCE DAILY. 30 tablet 0  . lovastatin (MEVACOR) 20 MG tablet TAKE 1 TABLET BY MOUTH AT BEDTIME. 30 tablet 0  . metFORMIN (GLUCOPHAGE) 1000 MG tablet Take 1 tablet (1,000 mg total) by mouth 2 (two) times daily with a meal. 180 tablet 3  . omeprazole (PRILOSEC) 20 MG capsule TAKE 1 CAPSULE BY MOUTH ONCE DAILY 30 MINUTES BEFORE FIRST MEAL. 30 capsule 0  . PAIN RELIEF EXTRA STRENGTH 500 MG tablet TAKE 1 TABLET BY  MOUTH ONCE DAILY AS NEEDED FOR PAIN. 30 tablet 0  . risperiDONE (RISPERDAL) 2 MG tablet Take 2 mg by mouth 2 (two) times daily.     . sitaGLIPtin (JANUVIA) 25 MG tablet Take 1 tablet (25 mg total) by mouth daily. 30 tablet 5  . UNABLE TO FIND Diabetic shoes with orthotic inserts (x 3) based on insurance coverage 1 each 0   No current facility-administered medications for this visit.     Allergies as of 10/12/2018 - Review Complete 10/12/2018  Allergen Reaction Noted  . Risperidone    . Sulfonamide derivatives  11/06/2009    Past Medical History:  Diagnosis Date  . Adenoma May 2010   Simple : TCS   . Allergic rhinitis   . Diabetes mellitus, type 1   . GERD (gastroesophageal reflux disease)   . Hyperlipidemia   . Personal history of schizophrenia     Past Surgical History:  Procedure Laterality Date  . COLONOSCOPY  MAY 2010   SIMPLE ADENOMAS  . MULTIPLE TOOTH EXTRACTIONS  Feb 03, 2010    Family History  Problem Relation Age of Onset  . Diabetes Mother   . Hypertension Mother   . Colon cancer Neg Hx     Social History   Socioeconomic History  . Marital status: Single    Spouse name:  Not on file  . Number of children: Not on file  . Years of education: Not on file  . Highest education level: Not on file  Occupational History  . Occupation: pt on fixed income   Social Needs  . Financial resource strain: Not on file  . Food insecurity    Worry: Not on file    Inability: Not on file  . Transportation needs    Medical: Not on file    Non-medical: Not on file  Tobacco Use  . Smoking status: Never Smoker  . Smokeless tobacco: Never Used  . Tobacco comment: Never smoked  Substance and Sexual Activity  . Alcohol use: No  . Drug use: No  . Sexual activity: Never  Lifestyle  . Physical activity    Days per week: Not on file    Minutes per session: Not on file  . Stress: Not on file  Relationships  . Social Herbalist on phone: Not on file    Gets  together: Not on file    Attends religious service: Not on file    Active member of club or organization: Not on file    Attends meetings of clubs or organizations: Not on file    Relationship status: Not on file  . Intimate partner violence    Fear of current or ex partner: Not on file    Emotionally abused: Not on file    Physically abused: Not on file    Forced sexual activity: Not on file  Other Topics Concern  . Not on file  Social History Narrative    Pt  Has Rx for Diabetic shoes       ROS:  General: Negative for anorexia, weight loss, fever, chills, fatigue, weakness. Eyes: Negative for vision changes.  ENT: Negative for hoarseness, difficulty swallowing , nasal congestion. CV: Negative for chest pain, angina, palpitations, dyspnea on exertion, peripheral edema.  Respiratory: Negative for dyspnea at rest, dyspnea on exertion, cough, sputum, wheezing.  GI: See history of present illness. GU:  Negative for dysuria, hematuria, urinary incontinence, urinary frequency, nocturnal urination.  MS: Negative for joint pain, low back pain.  Derm: Negative for rash or itching.  Neuro: Negative for weakness, abnormal sensation, seizure, frequent headaches, memory loss, confusion.  Psych: Negative for anxiety, depression, suicidal ideation, hallucinations.  Endo: Negative for unusual weight change.  Heme: Negative for bruising or bleeding. Allergy: Negative for rash or hives.    Physical Examination:  BP 120/77   Pulse (!) 103   Temp (!) 96.2 F (35.7 C) (Temporal)   Ht 5\' 2"  (1.575 m)   Wt 132 lb 12.8 oz (60.2 kg)   LMP 07/06/2012   BMI 24.29 kg/m    General: Well-nourished, well-developed in no acute distress.  Head: Normocephalic, atraumatic.   Eyes: Conjunctiva pink, no icterus. Mouthmasked Neck: Supple without thyromegaly, masses, or lymphadenopathy.  Lungs: Clear to auscultation bilaterally.  Heart: Regular rate and rhythm, no murmurs rubs or gallops.  Abdomen:  Bowel sounds are normal, nontender, nondistended, no hepatosplenomegaly or masses, no abdominal bruits or    hernia , no rebound or guarding.   Rectal: deferred Extremities: No lower extremity edema. No clubbing or deformities.  Neuro: Alert and oriented x 4 , grossly normal neurologically.  Skin: Warm and dry, no rash or jaundice.   Psych: Alert and cooperative, normal mood and affect.  Labs: Lab Results  Component Value Date   CREATININE 0.81 08/12/2018   BUN 6 (  L) 08/12/2018   NA 140 08/12/2018   K 4.2 08/12/2018   CL 105 08/12/2018   CO2 28 08/12/2018   Lab Results  Component Value Date   ALT 21 08/12/2018   AST 27 08/12/2018   ALKPHOS 65 02/09/2018   BILITOT 0.3 08/12/2018   Lab Results  Component Value Date   WBC 4.8 02/09/2018   HGB 12.8 02/09/2018   HCT 38.6 02/09/2018   MCV 93 02/09/2018   PLT 324 02/09/2018   Lab Results  Component Value Date   HGBA1C 8.3 (H) 08/12/2018     Imaging Studies: No results found.

## 2018-10-12 NOTE — Assessment & Plan Note (Signed)
Pleasant 63 year old female with history of simple adenomas removed in 2010, presenting for surveillance colonoscopy.  Reports bright red blood per rectum approximately twice per week.  Denies constipation or rectal pain.  Suspect benign anal rectal bleeding.  Recommend colonoscopy in the near future, plan for deep sedation given polypharmacy.  I have discussed the risks, alternatives, benefits with regards to but not limited to the risk of reaction to medication, bleeding, infection, perforation and the patient is agreeable to proceed. Written consent to be obtained.

## 2018-10-12 NOTE — Patient Instructions (Signed)
1. Colonoscopy as scheduled. Please see separate instructions. 

## 2018-10-12 NOTE — Progress Notes (Signed)
cc'ed to pcp °

## 2018-10-20 ENCOUNTER — Other Ambulatory Visit: Payer: Self-pay | Admitting: Family Medicine

## 2018-10-25 ENCOUNTER — Telehealth: Payer: Self-pay | Admitting: *Deleted

## 2018-10-25 MED ORDER — LORATADINE 10 MG PO TABS: ORAL_TABLET | ORAL | 5 refills | Status: DC

## 2018-10-25 MED ORDER — LISINOPRIL 2.5 MG PO TABS: 2.5000 mg | ORAL_TABLET | Freq: Every day | ORAL | 5 refills | Status: DC

## 2018-10-25 MED ORDER — OMEPRAZOLE 20 MG PO CPDR: DELAYED_RELEASE_CAPSULE | ORAL | 5 refills | Status: DC

## 2018-10-25 NOTE — Telephone Encounter (Signed)
Meds sent in

## 2018-10-25 NOTE — Telephone Encounter (Signed)
Wendy Oneill with [facility] called needing refills for the pt on lisinopril, laratadine, and omeprazole for the pt

## 2018-11-14 ENCOUNTER — Other Ambulatory Visit: Payer: Self-pay

## 2018-11-14 ENCOUNTER — Ambulatory Visit (HOSPITAL_COMMUNITY)
Admission: RE | Admit: 2018-11-14 | Discharge: 2018-11-14 | Disposition: A | Discharge status: Home / Self Care | Payer: Medicaid Other | Source: Ambulatory Visit | Attending: Family Medicine | Admitting: Family Medicine

## 2018-11-14 DIAGNOSIS — Z1231 Encounter for screening mammogram for malignant neoplasm of breast: Secondary | ICD-10-CM | POA: Diagnosis present

## 2018-11-15 NOTE — Progress Notes (Signed)
No malignancy noted in mammogram. F/u 1 yr.

## 2018-11-23 ENCOUNTER — Other Ambulatory Visit: Payer: Self-pay | Admitting: Family Medicine

## 2018-11-30 ENCOUNTER — Other Ambulatory Visit: Payer: Self-pay | Admitting: Family Medicine

## 2018-12-01 ENCOUNTER — Telehealth: Payer: Self-pay | Admitting: *Deleted

## 2018-12-01 NOTE — Telephone Encounter (Signed)
Wendy Oneill with [facility] called needing a refill on calcium and vitamin D and stool softener she can take these verbally as she thinks there is a prob with E script

## 2018-12-01 NOTE — Telephone Encounter (Signed)
Spoke with Anderson Malta at pharmacy. Gave verbal to refill scrips with 3 refills

## 2018-12-06 ENCOUNTER — Emergency Department (HOSPITAL_COMMUNITY): Payer: Medicaid Other

## 2018-12-06 ENCOUNTER — Other Ambulatory Visit: Payer: Self-pay

## 2018-12-06 ENCOUNTER — Encounter (HOSPITAL_COMMUNITY): Payer: Self-pay | Admitting: *Deleted

## 2018-12-06 ENCOUNTER — Emergency Department (HOSPITAL_COMMUNITY)
Admission: EM | Admit: 2018-12-06 | Discharge: 2018-12-06 | Disposition: A | Discharge status: Home / Self Care | Payer: Medicaid Other | Attending: Emergency Medicine | Admitting: Emergency Medicine

## 2018-12-06 DIAGNOSIS — R05 Cough: Secondary | ICD-10-CM | POA: Diagnosis present

## 2018-12-06 DIAGNOSIS — R0981 Nasal congestion: Secondary | ICD-10-CM | POA: Diagnosis not present

## 2018-12-06 DIAGNOSIS — E119 Type 2 diabetes mellitus without complications: Secondary | ICD-10-CM | POA: Diagnosis not present

## 2018-12-06 DIAGNOSIS — Z7984 Long term (current) use of oral hypoglycemic drugs: Secondary | ICD-10-CM | POA: Insufficient documentation

## 2018-12-06 DIAGNOSIS — R0789 Other chest pain: Secondary | ICD-10-CM | POA: Insufficient documentation

## 2018-12-06 DIAGNOSIS — Z79899 Other long term (current) drug therapy: Secondary | ICD-10-CM | POA: Insufficient documentation

## 2018-12-06 DIAGNOSIS — R067 Sneezing: Secondary | ICD-10-CM | POA: Insufficient documentation

## 2018-12-06 DIAGNOSIS — Z20828 Contact with and (suspected) exposure to other viral communicable diseases: Secondary | ICD-10-CM | POA: Insufficient documentation

## 2018-12-06 DIAGNOSIS — Z20822 Contact with and (suspected) exposure to covid-19: Secondary | ICD-10-CM

## 2018-12-06 DIAGNOSIS — I10 Essential (primary) hypertension: Secondary | ICD-10-CM | POA: Insufficient documentation

## 2018-12-06 MED ORDER — ALBUTEROL SULFATE HFA 108 (90 BASE) MCG/ACT IN AERS
2.0000 | INHALATION_SPRAY | Freq: Once | RESPIRATORY_TRACT | Status: AC
  Administered 2018-12-06: 2 via RESPIRATORY_TRACT
  Filled 2018-12-06: qty 6.7

## 2018-12-06 NOTE — ED Triage Notes (Signed)
Pt c/o upper respiratory symptoms x 5 days

## 2018-12-06 NOTE — ED Provider Notes (Signed)
[facility] EMERGENCY DEPARTMENT Provider Note   CSN: [id number] Arrival date & time: 12/06/18  2118     History   Chief Complaint Chief Complaint  Patient presents with  . URI    HPI Wendy Oneill is a 63 y.o. female.     Patient is a 63 year old female coming from nursing home with history of hypertension, diabetes, schizophrenia, GERD, hyperlipidemia presenting to the emergency department for flulike symptoms since this past Thursday.  Reports that she has had sneezing, coughing, nasal congestion.  Reports that when she coughs she feels pain in the lower part of her bilateral ribs.  Denies any shortness of breath, fever, nausea, vomiting, chest pain.  She did not try anything for relief.     Past Medical History:  Diagnosis Date  . Adenoma May 2010   Simple : TCS   . Allergic rhinitis   . Diabetes mellitus, type 1   . GERD (gastroesophageal reflux disease)   . Hyperlipidemia   . Personal history of schizophrenia     Patient Active Problem List   Diagnosis Date Noted  . Rectal bleeding 10/12/2018  . 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 Essential hypertension 11/16/2013  . Schizoaffective disorder, depressive type ([place]) 09/21/2012  . Bipolar disorder, current episode hypomanic ([place]) 09/21/2012  . Schizophrenia, unspecified type ([place]) 07/22/2009  . Gastro-esophageal reflux 12/12/2008  . COLONIC POLYPS, ADENOMATOUS, HX OF 12/11/2008  . CONSTIPATION 09/13/2008  . Allergic rhinitis 03/24/2008  . Hyperlipidemia LDL goal <100 06/02/2007    Past Surgical History:  Procedure Laterality Date  . COLONOSCOPY  MAY 2010   SIMPLE ADENOMAS  . MULTIPLE TOOTH EXTRACTIONS  Feb 03, 2010     OB History   No obstetric history on file.      Home Medications    Prior to Admission medications   Medication Sig Start Date End Date Taking? Authorizing Provider  ACCU-CHEK AVIVA PLUS test strip USE TO TEST ONCE DAILY. 10/20/18   Fayrene Helper, MD  Accu-Chek Softclix Lancets lancets USE TO TEST ONCE DAILY. 10/20/18   Fayrene Helper, MD  ASPIRIN LOW DOSE 81 MG EC tablet TAKE (1) TABLET BY MOUTH EACH MORNING. 12/01/18   Fayrene Helper, MD  CALCIUM CITRATE + D3 MAXIMUM 315-250 MG-UNIT TABS TAKE (1) TABLET BY MOUTH (3) TIMES DAILY. 09/24/18   Perlie Mayo, NP  COGENTIN 0.5 MG tablet TAKE (1) TABLET BY MOUTH TWICE DAILY. 05/26/11   Fayrene Helper, MD  docusate sodium (COLACE) 100 MG capsule TAKE (1) CAPSULE BY MOUTH TWICE DAILY. 09/24/18   Perlie Mayo, NP  escitalopram (LEXAPRO) 10 MG tablet TAKE (1) TABLET BY MOUTH EACH MORNING. 04/26/12   Fayrene Helper, MD  famotidine (PEPCID) 20 MG tablet Take 1 tablet (20 mg total) by mouth 2 (two) times daily. 08/24/18   Fayrene Helper, MD  ferrous sulfate 325 (65 FE) MG tablet One daily  06/22/18   Fayrene Helper, MD  lisinopril (ZESTRIL) 2.5 MG tablet Take 1 tablet (2.5 mg total) by mouth daily. 10/25/18   Fayrene Helper, MD  loratadine (ALLERGY RELIEF) 10 MG tablet TAKE (1) TABLET BY MOUTH ONCE DAILY. 10/25/18   Fayrene Helper, MD  lovastatin (MEVACOR) 20 MG tablet TAKE 1 TABLET BY MOUTH AT BEDTIME. 08/24/18   Fayrene Helper, MD  metFORMIN (GLUCOPHAGE) 1000 MG tablet Take 1 tablet (1,000 mg total) by mouth 2 (two) times daily with a meal. 08/16/18  Perlie Mayo, NP  omeprazole (PRILOSEC) 20 MG capsule TAKE 1 CAPSULE BY MOUTH ONCE DAILY 30 MINUTES BEFORE FIRST MEAL. 10/25/18   Fayrene Helper, MD  PAIN RELIEF EXTRA STRENGTH 500 MG tablet TAKE 1 TABLET BY MOUTH ONCE DAILY AS NEEDED FOR PAIN. 07/01/18   Perlie Mayo, NP  risperiDONE (RISPERDAL) 2 MG tablet Take 2 mg by mouth 2 (two) times daily.     [provider]  sitaGLIPtin (JANUVIA) 25 MG tablet Take 1 tablet (25 mg total) by mouth daily. 09/12/18   Fayrene Helper, MD  UNABLE TO FIND Diabetic shoes with orthotic inserts (x 3) based on insurance coverage 03/17/18   Perlie Mayo, NP     Family History Family History  Problem Relation Age of Onset  . Diabetes Mother   . Hypertension Mother   . Colon cancer Neg Hx     Social History Social History   Tobacco Use  . Smoking status: Never Smoker  . Smokeless tobacco: Never Used  . Tobacco comment: Never smoked  Substance Use Topics  . Alcohol use: No  . Drug use: No     Allergies   Risperidone and Sulfonamide derivatives   Review of Systems Review of Systems  Constitutional: Negative for activity change, appetite change, chills and fever.  HENT: Positive for congestion, rhinorrhea, sneezing and sore throat. Negative for ear pain.   Respiratory: Positive for cough. Negative for apnea, chest tightness, shortness of breath and wheezing.   Cardiovascular: Negative for chest pain.  Gastrointestinal: Negative for abdominal pain, nausea and vomiting.  Musculoskeletal: Negative for myalgias.  Skin: Negative for rash.  Allergic/Immunologic: Negative for immunocompromised state.  Neurological: Negative for dizziness, light-headedness and headaches.     Physical Exam Updated Vital Signs BP 123/85 (BP Location: Right Arm)   Pulse 100   Temp 98.8 F (37.1 C)   Resp 18   Ht 5\' 2"  (1.575 m)   Wt 60.3 kg   LMP 07/06/2012   SpO2 97%   BMI 24.33 kg/m   Physical Exam Vitals signs and nursing note reviewed.  Constitutional:      Appearance: Normal appearance.  HENT:     Head: Normocephalic.  Eyes:     Conjunctiva/sclera: Conjunctivae normal.     Pupils: Pupils are equal, round, and reactive to light.  Cardiovascular:     Rate and Rhythm: Normal rate and regular rhythm.  Pulmonary:     Effort: Pulmonary effort is normal.     Breath sounds: Wheezing (Left lower lobe) present. No rhonchi or rales.  Skin:    General: Skin is dry.  Neurological:     Mental Status: She is alert.  Psychiatric:        Mood and Affect: Mood normal.      ED Treatments / Results  Labs (all labs ordered are listed, but  only abnormal results are displayed) Labs Reviewed  SARS CORONAVIRUS 2 (TAT 6-24 HRS)    EKG None  Radiology Dg Chest Portable 1 View  Result Date: 12/06/2018 CLINICAL DATA:  Cough and URI symptoms for 5 days, history of diabetes EXAM: PORTABLE CHEST 1 VIEW COMPARISON:  Radiograph 12/19/2014 FINDINGS: No consolidation, features of edema, pneumothorax, or effusion. Pulmonary vascularity is normally distributed. The cardiomediastinal contours are unremarkable. No acute osseous or soft tissue abnormality. IMPRESSION: No acute cardiopulmonary abnormality. Electronically Signed   By: Lovena Le M.D.   On: 12/06/2018 22:43    Procedures Procedures (including critical care time)  Medications  Ordered in ED Medications  albuterol (VENTOLIN HFA) 108 (90 Base) MCG/ACT inhaler 2 puff (2 puffs Inhalation Given 12/06/18 2225)     Initial Impression / Assessment and Plan / ED Course  I have reviewed the triage vital signs and the nursing notes.  Pertinent labs & imaging results that were available during my care of the patient were reviewed by me and considered in my medical decision making (see chart for details).  Clinical Course as of Dec 05 2298  Tue Dec 06, 2018  2218 Patient with mild Covid symptoms since last Thursday.  She has normal vital signs and normal oxygen.  She does have some mild wheezing on my exam and so chest x-ray is ordered.  We will also order a Covid test and she was advised on strict return precautions as well as strict quarantine instructions as she does reside in a nursing home.  She does not meet admission criteria at this time. Normal chest xray   [KM]    Clinical Course User Index [KM] Alveria Apley, PA-C       Based on review of vitals, medical screening exam, lab work and/or imaging, there does not appear to be an acute, emergent etiology for the patient's symptoms. Counseled pt on good return precautions and encouraged both PCP and ED follow-up as needed.   Prior to discharge, I also discussed incidental imaging findings with patient in detail and advised appropriate, recommended follow-up in detail.  Clinical Impression: 1. Suspected COVID-19 virus infection     Disposition: Discharge  Prior to providing a prescription for a controlled substance, I independently reviewed the patient's recent prescription history on the [facility]. The patient had no recent or regular prescriptions and was deemed appropriate for a brief, less than 3 day prescription of narcotic for acute analgesia.  This note was prepared with assistance of Systems analyst. Occasional wrong-word or sound-a-like substitutions may have occurred due to the inherent limitations of voice recognition software.   Final Clinical Impressions(s) / ED Diagnoses   Final diagnoses:  Suspected COVID-19 virus infection    ED Discharge Orders    None       Kristine Royal 12/06/18 [place], Ankit, MD 12/10/18 1631

## 2018-12-06 NOTE — ED Notes (Signed)
Notified Rise Paganini that pt is ready for d/c. Levie Heritage stated she is on the way to pick pt up. Went over D/C instructions with Rise Paganini who verbalized understanding.

## 2018-12-06 NOTE — Discharge Instructions (Addendum)
You were seen today for cough and congestion. You have been given an inhaler youcan use every 4 hours as needed for cough. Your chest xray was normal. It is suspected you may have covid 19. Your oxygen is normal and other vital signs are normal so it is ok for you to go home but please return if you feel like you are having trouble breathing or cannot keep down any liquids. Make sure you quarantine yourself and stay away from others until you know your results of you test. Wash your hands frequently.

## 2018-12-07 LAB — SARS CORONAVIRUS 2 (TAT 6-24 HRS): SARS Coronavirus 2: NEGATIVE

## 2018-12-21 ENCOUNTER — Encounter: Payer: Self-pay | Admitting: Family Medicine

## 2018-12-21 ENCOUNTER — Other Ambulatory Visit (HOSPITAL_COMMUNITY)
Admission: RE | Admit: 2018-12-21 | Discharge: 2018-12-21 | Disposition: A | Discharge status: Home / Self Care | Payer: Medicaid Other | Source: Ambulatory Visit | Attending: Family Medicine | Admitting: Family Medicine

## 2018-12-21 ENCOUNTER — Ambulatory Visit (INDEPENDENT_AMBULATORY_CARE_PROVIDER_SITE_OTHER): Payer: Medicaid Other | Admitting: Family Medicine

## 2018-12-21 ENCOUNTER — Other Ambulatory Visit: Payer: Self-pay

## 2018-12-21 VITALS — BP 114/80 | HR 96 | Temp 98.2°F | Resp 15 | Ht 62.0 in | Wt 129.0 lb

## 2018-12-21 DIAGNOSIS — Z0000 Encounter for general adult medical examination without abnormal findings: Secondary | ICD-10-CM | POA: Diagnosis not present

## 2018-12-21 DIAGNOSIS — L84 Corns and callosities: Secondary | ICD-10-CM

## 2018-12-21 DIAGNOSIS — Z23 Encounter for immunization: Secondary | ICD-10-CM

## 2018-12-21 DIAGNOSIS — Z124 Encounter for screening for malignant neoplasm of cervix: Secondary | ICD-10-CM | POA: Diagnosis present

## 2018-12-21 DIAGNOSIS — E11628 Type 2 diabetes mellitus with other skin complications: Secondary | ICD-10-CM | POA: Insufficient documentation

## 2018-12-21 DIAGNOSIS — F209 Schizophrenia, unspecified: Secondary | ICD-10-CM | POA: Diagnosis not present

## 2018-12-21 DIAGNOSIS — E559 Vitamin D deficiency, unspecified: Secondary | ICD-10-CM

## 2018-12-21 DIAGNOSIS — E785 Hyperlipidemia, unspecified: Secondary | ICD-10-CM

## 2018-12-21 HISTORY — DX: Encounter for general adult medical examination without abnormal findings: Z00.00

## 2018-12-21 NOTE — Assessment & Plan Note (Signed)
Uncontrolled Updated lab needed at/ before next visit. Wendy Oneill is reminded of the importance of commitment to daily physical activity for 30 minutes or more, as able and the need to limit carbohydrate intake to 30 to 60 grams per meal to help with blood sugar control.   The need to take medication as prescribed, test blood sugar as directed, and to call between visits if there is a concern that blood sugar is uncontrolled is also discussed.   Wendy Oneill is reminded of the importance of daily foot exam, annual eye examination, and good blood sugar, blood pressure and cholesterol control.  Diabetic Labs Latest Ref Rng & Units 08/12/2018 02/09/2018 10/25/2017 01/04/2017 09/01/2016  HbA1c <5.7 % of total Hgb 8.3(H) 7.4(H) 6.7(H) 7.3(H) -  Microalbumin mg/dL - - - 0.5 <3.0(H)  Micro/Creat Ratio <30 mcg/mg creat - - - 4 <3.6  Chol 100 - 199 mg/dL - 153 - 142 -  HDL >39 mg/dL - 52 - 42(L) -  Calc LDL 0 - 99 mg/dL - 76 - 84 -  Triglycerides 0 - 149 mg/dL - 123 - 73 -  Creatinine 0.50 - 0.99 mg/dL 0.81 0.89 0.82 0.80 -   BP/Weight 12/21/2018 12/06/2018 10/12/2018 09/12/2018 08/12/2018 07/21/2018 99991111  Systolic BP 99991111 Q000111Q 123456 A999333 A999333 [id number] 123456  Diastolic BP 80 78 77 82 60 80 75  Wt. (Lbs) 129 133 132.8 135 131.08 133 133.04  BMI 23.59 24.33 24.29 24.69 23.97 24.33 24.33   Foot/eye exam completion dates Latest Ref Rng & Units 12/21/2018 03/16/2018  Eye Exam No Retinopathy - -  Foot exam Order - - -  Foot Form Completion - Done Done

## 2018-12-21 NOTE — Patient Instructions (Addendum)
F/U with MD in 4  months, call if you need me sooner  Please see if appt date with Dr Jorja Loa can be given to patient and caregiver, thanks, referred in August Microalb from office today  Pneumonia 23 today  Need eye exam scheduled and need to keep this, this is overdue.  HBA1C, cmp and eGFR  And vit D today  Microalb from office today  Pap today  All the best with upcoming colonoscopy!  Please get fasting lipid, cmp and EGFR , hBA1C, cBC and TSH 1 week before next visit in April Thanks for choosing [facility], we consider it a privelige to serve you.

## 2018-12-21 NOTE — Progress Notes (Signed)
Wendy Oneill     MRN: [id number]      DOB: July 05, 1955  HPI: Patient is in for annual physical exam. 5 day h/o itchy white vaginal d/c , not sexually active. Immunization is reviewed , and  updated  PE: BP 114/80   Pulse 96   Temp 98.2 F (36.8 C) (Temporal)   Resp 15   Ht 5\' 2"  (1.575 m)   Wt 129 lb (58.5 kg)   LMP 07/06/2012   SpO2 98%   BMI 23.59 kg/m   Pleasant  female, alert and oriented x 3, in no cardio-pulmonary distress. Afebrile. HEENT No facial trauma or asymetry. Sinuses non tender.  Extra occullar muscles intact.. External ears normal, . Neck: supple, no adenopathy,JVD or thyromegaly.No bruits.  Chest: Clear to ascultation bilaterally.No crackles or wheezes. Non tender to palpation  Breast: No asymetry,no masses or lumps. No tenderness. No nipple discharge or inversion. No axillary or supraclavicular adenopathy  Cardiovascular system; Heart sounds normal,  S1 and  S2 ,no S3.  No murmur, or thrill. Apical beat not displaced Peripheral pulses normal.  Abdomen: Soft, non tender, no organomegaly or masses. No bruits. Bowel sounds normal. No guarding, tenderness or rebound.   GU: External genitalia normal female genitalia , normal female distribution of hair. No lesions. Urethral meatus normal in size, no  Prolapse, no lesions visibly  Present. Bladder non tender. Vagina pink and moist , with no visible lesions , white discharge present . Adequate pelvic support no  cystocele or rectocele noted Cervix pink and appears healthy, no lesions or ulcerations noted,  discharge noted from os Uterus normal size, no adnexal masses, no cervical motion or adnexal tenderness.   Musculoskeletal exam: Full ROM of spine, hips , shoulders and knees. No deformity ,swelling or crepitus noted. No muscle wasting or atrophy.   Neurologic: Cranial nerves 2 to 12 intact. Power, tone ,sensation and reflexes normal throughout. No disturbance in gait. No tremor.   Skin: Intact, no ulceration, erythema , scaling or rash noted. Pigmentation normal throughout  Psych; Normal mood and affect. Judgement and concentration normal   Assessment & Plan:  Encounter for annual physical exam Annual exam as documented. Immunization and cancer screening needs are specifically addressed at this visit.   Need for 23-polyvalent pneumococcal polysaccharide vaccine After obtaining informed consent, the vaccine is  administered , with no adverse effect noted at the time of administration.   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 Wendy Oneill is reminded of the importance of commitment to daily physical activity for 30 minutes or more, as able and the need to limit carbohydrate intake to 30 to 60 grams per meal to help with blood sugar control.   The need to take medication as prescribed, test blood sugar as directed, and to call between visits if there is a concern that blood sugar is uncontrolled is also discussed.   Wendy Oneill is reminded of the importance of daily foot exam, annual eye examination, and good blood sugar, blood pressure and cholesterol control.  Diabetic Labs Latest Ref Rng & Units 08/12/2018 02/09/2018 10/25/2017 01/04/2017 09/01/2016  HbA1c <5.7 % of total Hgb 8.3(H) 7.4(H) 6.7(H) 7.3(H) -  Microalbumin mg/dL - - - 0.5 <3.0(H)  Micro/Creat Ratio <30 mcg/mg creat - - - 4 <3.6  Chol 100 - 199 mg/dL - 153 - 142 -  HDL >39 mg/dL - 52 - 42(L) -  Calc LDL 0 - 99  mg/dL - 76 - 84 -  Triglycerides 0 - 149 mg/dL - 123 - 73 -  Creatinine 0.50 - 0.99 mg/dL 0.81 0.89 0.82 0.80 -   BP/Weight 12/21/2018 12/06/2018 10/12/2018 09/12/2018 08/12/2018 07/21/2018 99991111  Systolic BP 99991111 Q000111Q 123456 A999333 A999333 [id number] [id number]  Diastolic BP 80 78 77 82 60 80 75  Wt. (Lbs) 129 133 132.8 135 131.08 133 133.04  BMI 23.59 24.33 24.29 24.69 23.97 24.33 24.33   Foot/eye exam completion dates Latest Ref Rng & Units  12/21/2018 03/16/2018  Eye Exam No Retinopathy - -  Foot exam Order - - -  Foot Form Completion - Done Done

## 2018-12-21 NOTE — Assessment & Plan Note (Signed)
After obtaining informed consent, the vaccine is  administered , with no adverse effect noted at the time of administration.  

## 2018-12-21 NOTE — Assessment & Plan Note (Signed)
Annual exam as documented. . Immunization and cancer screening needs are specifically addressed at this visit.  

## 2018-12-22 LAB — COMPLETE METABOLIC PANEL WITH GFR
AG Ratio: 1.5 (calc) (ref 1.0–2.5)
ALT: 19 U/L (ref 6–29)
AST: 17 U/L (ref 10–35)
Albumin: 4.3 g/dL (ref 3.6–5.1)
Alkaline phosphatase (APISO): 49 U/L (ref 37–153)
BUN: 10 mg/dL (ref 7–25)
CO2: 26 mmol/L (ref 20–32)
Calcium: 10.8 mg/dL — ABNORMAL HIGH (ref 8.6–10.4)
Chloride: 104 mmol/L (ref 98–110)
Creat: 0.8 mg/dL (ref 0.50–0.99)
GFR, Est African American: 92 mL/min/{1.73_m2} (ref >=60)
GFR, Est Non African American: 80 mL/min/{1.73_m2} (ref >=60)
Globulin: 2.9 g/dL (calc) (ref 1.9–3.7)
Glucose, Bld: 136 mg/dL (ref 65–139)
Potassium: 4.3 mmol/L (ref 3.5–5.3)
Sodium: 141 mmol/L (ref 135–146)
Total Bilirubin: 0.3 mg/dL (ref 0.2–1.2)
Total Protein: 7.2 g/dL (ref 6.1–8.1)

## 2018-12-22 LAB — HEMOGLOBIN A1C
Hgb A1c MFr Bld: 7.8 % of total Hgb — ABNORMAL HIGH (ref <5.7)
Mean Plasma Glucose: 177 (calc)
eAG (mmol/L): 9.8 (calc)

## 2018-12-22 LAB — MICROALBUMIN / CREATININE URINE RATIO
Creatinine, Urine: 32.3 mg/dL
Microalb Creat Ratio: <9 mg/g creat (ref 0–29)
Microalb, Ur: <3 ug/mL — ABNORMAL HIGH

## 2018-12-22 LAB — VITAMIN D 25 HYDROXY (VIT D DEFICIENCY, FRACTURES): Vit D, 25-Hydroxy: 37 ng/mL (ref 30–100)

## 2018-12-23 ENCOUNTER — Other Ambulatory Visit: Payer: Self-pay

## 2018-12-23 ENCOUNTER — Other Ambulatory Visit: Payer: Self-pay | Admitting: Family Medicine

## 2018-12-23 DIAGNOSIS — K219 Gastro-esophageal reflux disease without esophagitis: Secondary | ICD-10-CM

## 2018-12-23 LAB — CYTOLOGY - PAP
Comment: NEGATIVE
Diagnosis: NEGATIVE
High risk HPV: NEGATIVE

## 2018-12-23 MED ORDER — FERROUS SULFATE 325 (65 FE) MG PO TABS: ORAL_TABLET | ORAL | 5 refills | Status: DC

## 2018-12-27 ENCOUNTER — Other Ambulatory Visit: Payer: Self-pay

## 2018-12-27 DIAGNOSIS — E785 Hyperlipidemia, unspecified: Secondary | ICD-10-CM

## 2018-12-27 DIAGNOSIS — E11628 Type 2 diabetes mellitus with other skin complications: Secondary | ICD-10-CM

## 2018-12-27 DIAGNOSIS — I10 Essential (primary) hypertension: Secondary | ICD-10-CM

## 2019-01-03 ENCOUNTER — Encounter (HOSPITAL_COMMUNITY): Payer: Self-pay

## 2019-01-03 ENCOUNTER — Other Ambulatory Visit: Payer: Self-pay

## 2019-01-03 ENCOUNTER — Other Ambulatory Visit (HOSPITAL_COMMUNITY): Payer: Medicaid Other

## 2019-01-03 ENCOUNTER — Other Ambulatory Visit (HOSPITAL_COMMUNITY)
Admission: RE | Admit: 2019-01-03 | Discharge: 2019-01-03 | Disposition: A | Discharge status: Home / Self Care | Payer: Medicaid Other | Source: Ambulatory Visit | Attending: Gastroenterology | Admitting: Gastroenterology

## 2019-01-03 ENCOUNTER — Encounter (HOSPITAL_COMMUNITY)
Admission: RE | Admit: 2019-01-03 | Discharge: 2019-01-03 | Disposition: A | Discharge status: Home / Self Care | Payer: Medicaid Other | Source: Ambulatory Visit | Attending: Gastroenterology | Admitting: Gastroenterology

## 2019-01-03 DIAGNOSIS — Z20828 Contact with and (suspected) exposure to other viral communicable diseases: Secondary | ICD-10-CM | POA: Insufficient documentation

## 2019-01-03 DIAGNOSIS — Z01812 Encounter for preprocedural laboratory examination: Secondary | ICD-10-CM | POA: Insufficient documentation

## 2019-01-03 HISTORY — DX: Schizophrenia, unspecified: F20.9

## 2019-01-03 LAB — SARS CORONAVIRUS 2 (TAT 6-24 HRS): SARS Coronavirus 2: NEGATIVE

## 2019-01-03 NOTE — Patient Instructions (Signed)
Wendy Oneill  01/03/2019     @PREFPERIOPPHARMACY @   Your procedure is scheduled on  01/05/2019 .  Report to [facility] at  0930  A.M.  Call this number if you have problems the morning of surgery:  [phone]   Remember:  Follow the diet and prep instructions given to you by Dr Oneida Alar office.                    Take these medicines the morning of surgery with A SIP OF WATER  Lexapro, pepcid, cogentin, prilosec.    Do not wear jewelry, make-up or nail polish.  Do not wear lotions, powders, or perfumes. Please wear deodorant and brush your teeth.  Do not shave 48 hours prior to surgery.  Men may shave face and neck.  Do not bring valuables to the hospital.  [facility] is not responsible for any belongings or valuables.  Contacts, dentures or bridgework may not be worn into surgery.  Leave your suitcase in the car.  After surgery it may be brought to your room.  For patients admitted to the hospital, discharge time will be determined by your treatment team.  Patients discharged the day of surgery will not be allowed to drive home.   Name and phone number of your driver:   Family Special instructions:  None  Please read over the following fact sheets that you were given. Anesthesia Post-op Instructions and Care and Recovery After Surgery       Colonoscopy, Adult, Care After This sheet gives you information about how to care for yourself after your procedure. Your health care provider may also give you more specific instructions. If you have problems or questions, contact your health care provider. What can I expect after the procedure? After the procedure, it is common to have:  A small amount of blood in your stool for 24 hours after the procedure.  Some gas.  Mild abdominal cramping or bloating. Follow these instructions at home: General instructions  For the first 24 hours after the procedure: ? Do not drive or use machinery. ? Do not sign important  documents. ? Do not drink alcohol. ? Do your regular daily activities at a slower pace than normal. ? Eat soft, easy-to-digest foods.  Take over-the-counter or prescription medicines only as told by your health care provider. Relieving cramping and bloating   Try walking around when you have cramps or feel bloated.  Apply heat to your abdomen as told by your health care provider. Use a heat source that your health care provider recommends, such as a moist heat pack or a heating pad. ? Place a towel between your skin and the heat source. ? Leave the heat on for 20-30 minutes. ? Remove the heat if your skin turns bright red. This is especially important if you are unable to feel pain, heat, or cold. You may have a greater risk of getting burned. Eating and drinking   Drink enough fluid to keep your urine pale yellow.  Resume your normal diet as instructed by your health care provider. Avoid heavy or fried foods that are hard to digest.  Avoid drinking alcohol for as long as instructed by your health care provider. Contact a health care provider if:  You have blood in your stool 2-3 days after the procedure. Get help right away if:  You have more than a small spotting of blood in your stool.  You pass  large blood clots in your stool.  Your abdomen is swollen.  You have nausea or vomiting.  You have a fever.  You have increasing abdominal pain that is not relieved with medicine. Summary  After the procedure, it is common to have a small amount of blood in your stool. You may also have mild abdominal cramping and bloating.  For the first 24 hours after the procedure, do not drive or use machinery, sign important documents, or drink alcohol.  Contact your health care provider if you have a lot of blood in your stool, nausea or vomiting, a fever, or increased abdominal pain. This information is not intended to replace advice given to you by your health care provider. Make sure  you discuss any questions you have with your health care provider. Document Released: 08/20/2003 Document Revised: 10/28/2016 Document Reviewed: 03/19/2015 Elsevier Patient Education  2020 [place] After These instructions provide you with information about caring for yourself after your procedure. Your health care provider may also give you more specific instructions. Your treatment has been planned according to current medical practices, but problems sometimes occur. Call your health care provider if you have any problems or questions after your procedure. What can I expect after the procedure? After your procedure, you may:  Feel sleepy for several hours.  Feel clumsy and have poor balance for several hours.  Feel forgetful about what happened after the procedure.  Have poor judgment for several hours.  Feel nauseous or vomit.  Have a sore throat if you had a breathing tube during the procedure. Follow these instructions at home: For at least 24 hours after the procedure:      Have a responsible adult stay with you. It is important to have someone help care for you until you are awake and alert.  Rest as needed.  Do not: ? Participate in activities in which you could fall or become injured. ? Drive. ? Use heavy machinery. ? Drink alcohol. ? Take sleeping pills or medicines that cause drowsiness. ? Make important decisions or sign legal documents. ? Take care of children on your own. Eating and drinking  Follow the diet that is recommended by your health care provider.  If you vomit, drink water, juice, or soup when you can drink without vomiting.  Make sure you have little or no nausea before eating solid foods. General instructions  Take over-the-counter and prescription medicines only as told by your health care provider.  If you have sleep apnea, surgery and certain medicines can increase your risk for breathing problems.  Follow instructions from your health care provider about wearing your sleep device: ? Anytime you are sleeping, including during daytime naps. ? While taking prescription pain medicines, sleeping medicines, or medicines that make you drowsy.  If you smoke, do not smoke without supervision.  Keep all follow-up visits as told by your health care provider. This is important. Contact a health care provider if:  You keep feeling nauseous or you keep vomiting.  You feel light-headed.  You develop a rash.  You have a fever. Get help right away if:  You have trouble breathing. Summary  For several hours after your procedure, you may feel sleepy and have poor judgment.  Have a responsible adult stay with you for at least 24 hours or until you are awake and alert. This information is not intended to replace advice given to you by your health care provider. Make sure you discuss any  questions you have with your health care provider. Document Released: 04/28/2015 Document Revised: 04/05/2017 Document Reviewed: 04/28/2015 Elsevier Patient Education  2020 [employer].

## 2019-01-05 ENCOUNTER — Encounter (HOSPITAL_COMMUNITY)
Admission: RE | Disposition: A | Discharge status: Skilled Nursing Facility | Payer: Self-pay | Source: Home / Self Care | Attending: Gastroenterology

## 2019-01-05 ENCOUNTER — Ambulatory Visit (HOSPITAL_COMMUNITY)
Admission: RE | Admit: 2019-01-05 | Discharge: 2019-01-05 | Disposition: A | Discharge status: Skilled Nursing Facility | Payer: Medicaid Other | Attending: Gastroenterology | Admitting: Gastroenterology

## 2019-01-05 ENCOUNTER — Other Ambulatory Visit: Payer: Self-pay

## 2019-01-05 ENCOUNTER — Ambulatory Visit (HOSPITAL_COMMUNITY): Payer: Medicaid Other | Admitting: Anesthesiology

## 2019-01-05 ENCOUNTER — Encounter (HOSPITAL_COMMUNITY): Payer: Self-pay | Admitting: Gastroenterology

## 2019-01-05 DIAGNOSIS — F319 Bipolar disorder, unspecified: Secondary | ICD-10-CM | POA: Insufficient documentation

## 2019-01-05 DIAGNOSIS — Z79899 Other long term (current) drug therapy: Secondary | ICD-10-CM | POA: Diagnosis not present

## 2019-01-05 DIAGNOSIS — Z1211 Encounter for screening for malignant neoplasm of colon: Secondary | ICD-10-CM | POA: Diagnosis present

## 2019-01-05 DIAGNOSIS — K219 Gastro-esophageal reflux disease without esophagitis: Secondary | ICD-10-CM | POA: Insufficient documentation

## 2019-01-05 DIAGNOSIS — E109 Type 1 diabetes mellitus without complications: Secondary | ICD-10-CM | POA: Insufficient documentation

## 2019-01-05 DIAGNOSIS — Z86010 Personal history of colonic polyps: Secondary | ICD-10-CM | POA: Diagnosis not present

## 2019-01-05 DIAGNOSIS — Z7984 Long term (current) use of oral hypoglycemic drugs: Secondary | ICD-10-CM | POA: Insufficient documentation

## 2019-01-05 DIAGNOSIS — I10 Essential (primary) hypertension: Secondary | ICD-10-CM | POA: Diagnosis not present

## 2019-01-05 DIAGNOSIS — E785 Hyperlipidemia, unspecified: Secondary | ICD-10-CM | POA: Insufficient documentation

## 2019-01-05 DIAGNOSIS — F209 Schizophrenia, unspecified: Secondary | ICD-10-CM | POA: Insufficient documentation

## 2019-01-05 DIAGNOSIS — Q439 Congenital malformation of intestine, unspecified: Secondary | ICD-10-CM | POA: Insufficient documentation

## 2019-01-05 HISTORY — PX: COLONOSCOPY WITH PROPOFOL: SHX5780

## 2019-01-05 LAB — GLUCOSE, CAPILLARY: Glucose-Capillary: 133 mg/dL — ABNORMAL HIGH (ref 70–99)

## 2019-01-05 SURGERY — COLONOSCOPY WITH PROPOFOL
Anesthesia: General

## 2019-01-05 MED ORDER — HYDROCODONE-ACETAMINOPHEN 7.5-325 MG PO TABS: 1.0000 | ORAL_TABLET | Freq: Once | ORAL | Status: DC | PRN

## 2019-01-05 MED ORDER — PROPOFOL 10 MG/ML IV BOLUS
INTRAVENOUS | Status: AC
  Filled 2019-01-05: qty 40

## 2019-01-05 MED ORDER — PROPOFOL 500 MG/50ML IV EMUL
INTRAVENOUS | Status: DC | PRN
  Administered 2019-01-05: 150 ug/kg/min via INTRAVENOUS

## 2019-01-05 MED ORDER — MIDAZOLAM HCL 2 MG/2ML IJ SOLN: 0.5000 mg | Freq: Once | INTRAMUSCULAR | Status: DC | PRN

## 2019-01-05 MED ORDER — PROMETHAZINE HCL 25 MG/ML IJ SOLN: 6.2500 mg | INTRAMUSCULAR | Status: DC | PRN

## 2019-01-05 MED ORDER — STERILE WATER FOR IRRIGATION IR SOLN
Status: DC | PRN
  Administered 2019-01-05: 11:00:00 100 mL

## 2019-01-05 MED ORDER — HYDROMORPHONE HCL 1 MG/ML IJ SOLN: 0.2500 mg | INTRAMUSCULAR | Status: DC | PRN

## 2019-01-05 MED ORDER — LACTATED RINGERS IV SOLN: INTRAVENOUS | Status: DC

## 2019-01-05 NOTE — Transfer of Care (Signed)
Immediate Anesthesia Transfer of Care Note  Patient: Wendy Oneill  Procedure(s) Performed: COLONOSCOPY WITH PROPOFOL (N/A )  Patient Location: PACU  Anesthesia Type:MAC  Level of Consciousness: awake, alert , oriented and patient cooperative  Airway & Oxygen Therapy: Patient Spontanous Breathing  Post-op Assessment: Report given to RN, Post -op Vital signs reviewed and stable and Patient moving all extremities X 4  Post vital signs: Reviewed and stable  Last Vitals:  Vitals Value Taken Time  BP 118/65 01/05/19 1200  Temp    Pulse 85 01/05/19 1203  Resp 19 01/05/19 1203  SpO2 100 % 01/05/19 1203  Vitals shown include unvalidated device data.  Last Pain:  Vitals:   01/05/19 1157  PainSc: 0-No pain         Complications: No apparent anesthesia complications

## 2019-01-05 NOTE — H&P (Signed)
Primary Care Physician:  Fayrene Helper, MD Primary Gastroenterologist:  Dr. Oneida Alar  Pre-Procedure History & Physical: HPI:  Wendy Oneill is a 63 y.o. female here for  PERSONAL HISTORY OF POLYPS.  Past Medical History:  Diagnosis Date  . Adenoma May 2010   Simple : TCS   . Allergic rhinitis   . Diabetes mellitus, type 1   . GERD (gastroesophageal reflux disease)   . Hyperlipidemia   . Personal history of schizophrenia   . Schizophrenia [facility])     Past Surgical History:  Procedure Laterality Date  . COLONOSCOPY  MAY 2010   SIMPLE ADENOMAS  . MULTIPLE TOOTH EXTRACTIONS  Feb 03, 2010    Prior to Admission medications   Medication Sig Start Date End Date Taking? Authorizing Provider  ASPIRIN LOW DOSE 81 MG EC tablet TAKE (1) TABLET BY MOUTH EACH MORNING. Patient taking differently: Take 81 mg by mouth daily.  12/01/18  Yes Fayrene Helper, MD  CALCIUM CITRATE + D3 MAXIMUM 315-250 MG-UNIT TABS TAKE (1) TABLET BY MOUTH (3) TIMES DAILY. Patient taking differently: Take 1 tablet by mouth 3 (three) times daily.  09/24/18  Yes Perlie Mayo, NP  COGENTIN 0.5 MG tablet TAKE (1) TABLET BY MOUTH TWICE DAILY. Patient taking differently: Take 0.5 mg by mouth 2 (two) times daily.  05/26/11  Yes Fayrene Helper, MD  docusate sodium (COLACE) 100 MG capsule TAKE (1) CAPSULE BY MOUTH TWICE DAILY. Patient taking differently: Take 100 mg by mouth 2 (two) times daily.  09/24/18  Yes Perlie Mayo, NP  escitalopram (LEXAPRO) 10 MG tablet TAKE (1) TABLET BY MOUTH EACH MORNING. Patient taking differently: Take 10 mg by mouth daily.  04/26/12  Yes Fayrene Helper, MD  famotidine (PEPCID) 20 MG tablet TAKE 1 TABLET BY MOUTH TWICE DAILY. Patient taking differently: Take 20 mg by mouth 2 (two) times daily.  12/23/18  Yes Fayrene Helper, MD  ferrous sulfate 325 (65 FE) MG tablet One daily Patient taking differently: Take 325 mg by mouth daily with breakfast.  12/23/18  Yes Fayrene Helper, MD  lisinopril (ZESTRIL) 2.5 MG tablet Take 1 tablet (2.5 mg total) by mouth daily. 10/25/18  Yes Fayrene Helper, MD  loratadine (ALLERGY RELIEF) 10 MG tablet TAKE (1) TABLET BY MOUTH ONCE DAILY. Patient taking differently: Take 10 mg by mouth daily.  10/25/18  Yes Fayrene Helper, MD  lovastatin (MEVACOR) 20 MG tablet TAKE 1 TABLET BY MOUTH AT BEDTIME. Patient taking differently: Take 20 mg by mouth at bedtime.  12/23/18  Yes Fayrene Helper, MD  metFORMIN (GLUCOPHAGE) 1000 MG tablet Take 1 tablet (1,000 mg total) by mouth 2 (two) times daily with a meal. 08/16/18  Yes Perlie Mayo, NP  omeprazole (PRILOSEC) 20 MG capsule TAKE 1 CAPSULE BY MOUTH ONCE DAILY 30 MINUTES BEFORE FIRST MEAL. Patient taking differently: Take 20 mg by mouth daily before breakfast.  10/25/18  Yes Fayrene Helper, MD  PAIN RELIEF EXTRA STRENGTH 500 MG tablet TAKE 1 TABLET BY MOUTH ONCE DAILY AS NEEDED FOR PAIN. Patient taking differently: Take 500 mg by mouth daily as needed for moderate pain.  07/01/18  Yes Perlie Mayo, NP  risperiDONE (RISPERDAL) 2 MG tablet Take 2 mg by mouth 2 (two) times daily.    Yes [provider]  sitaGLIPtin (JANUVIA) 25 MG tablet Take 1 tablet (25 mg total) by mouth daily. 09/12/18  Yes Fayrene Helper, MD  ACCU-CHEK AVIVA  PLUS test strip USE TO TEST ONCE DAILY. 10/20/18   Fayrene Helper, MD  Accu-Chek Softclix Lancets lancets USE TO TEST ONCE DAILY. 10/20/18   Fayrene Helper, MD  UNABLE TO FIND Diabetic shoes with orthotic inserts (x 3) based on insurance coverage 03/17/18   Perlie Mayo, NP    Allergies as of 10/12/2018 - Review Complete 10/12/2018  Allergen Reaction Noted  . Risperidone    . Sulfonamide derivatives  11/06/2009    Family History  Problem Relation Age of Onset  . Diabetes Mother   . Hypertension Mother   . Colon cancer Neg Hx     Social History   Socioeconomic History  . Marital status: Single    Spouse name:  Not on file  . Number of children: Not on file  . Years of education: Not on file  . Highest education level: Not on file  Occupational History  . Occupation: pt on fixed income   Tobacco Use  . Smoking status: Never Smoker  . Smokeless tobacco: Never Used  . Tobacco comment: Never smoked  Substance and Sexual Activity  . Alcohol use: No  . Drug use: No  . Sexual activity: Never  Other Topics Concern  . Not on file  Social History Narrative    Pt  Has Rx for Diabetic shoes    Social Determinants of Health   Financial Resource Strain:   . Difficulty of Paying Living Expenses: Not on file  Food Insecurity:   . Worried About Charity fundraiser in the Last Year: Not on file  . Ran Out of Food in the Last Year: Not on file  Transportation Needs:   . Lack of Transportation (Medical): Not on file  . Lack of Transportation (Non-Medical): Not on file  Physical Activity:   . Days of Exercise per Week: Not on file  . Minutes of Exercise per Session: Not on file  Stress:   . Feeling of Stress : Not on file  Social Connections:   . Frequency of Communication with Friends and Family: Not on file  . Frequency of Social Gatherings with Friends and Family: Not on file  . Attends Religious Services: Not on file  . Active Member of Clubs or Organizations: Not on file  . Attends Archivist Meetings: Not on file  . Marital Status: Not on file  Intimate Partner Violence:   . Fear of Current or Ex-Partner: Not on file  . Emotionally Abused: Not on file  . Physically Abused: Not on file  . Sexually Abused: Not on file    Review of Systems: See HPI, otherwise negative ROS   Physical Exam: BP (!) 146/81 (BP Location: Right Arm)   Pulse 86   Temp 98.7 F (37.1 C)   LMP 07/06/2012   SpO2 96%  General:   Alert,  pleasant and cooperative in NAD Head:  Normocephalic and atraumatic. Neck:  Supple; Lungs:  Clear throughout to auscultation.    Heart:  Regular rate and  rhythm. Abdomen:  Soft, nontender and nondistended. Normal bowel sounds, without guarding, and without rebound.   Neurologic:  Alert and  oriented x4;  grossly normal neurologically.  Impression/Plan:      PERSONAL HISTORY OF POLYPS.  PLAN: 1. TCS TODAY. DISCUSSED PROCEDURE, BENEFITS, & RISKS: < 1% chance of medication reaction, bleeding, perforation, ASPIRATION, or rupture of spleen/liver requiring surgery to fix it and missed polyps < 1 cm 10-20% of the time.

## 2019-01-05 NOTE — Discharge Instructions (Signed)
YOU DID NOT HAVE ANY POLYPS.   DRINK WATER TO KEEP YOUR URINE LIGHT YELLOW.  FOLLOW A HIGH FIBER DIET. AVOID ITEMS THAT CAUSE BLOATING. SEE INFO BELOW.  Next colonoscopy AS EARLY AS DEC 2025 AND AS LATE AS DEC 2027.    Colonoscopy Care After Read the instructions outlined below and refer to this sheet in the next week. These discharge instructions provide you with general information on caring for yourself after you leave the hospital. While your treatment has been planned according to the most current medical practices available, unavoidable complications occasionally occur. If you have any problems or questions after discharge, call DR. Obie Silos, [phone].  ACTIVITY  You may resume your regular activity, but move at a slower pace for the next 24 hours.   Take frequent rest periods for the next 24 hours.   Walking will help get rid of the air and reduce the bloated feeling in your belly (abdomen).   No driving for 24 hours (because of the medicine (anesthesia) used during the test).   You may shower.   Do not sign any important legal documents or operate any machinery for 24 hours (because of the anesthesia used during the test).    NUTRITION  Drink plenty of fluids.   You may resume your normal diet as instructed by your doctor.   Begin with a light meal and progress to your normal diet. Heavy or fried foods are harder to digest and may make you feel sick to your stomach (nauseated).   Avoid alcoholic beverages for 24 hours or as instructed.    MEDICATIONS  You may resume your normal medications.   WHAT YOU CAN EXPECT TODAY  Some feelings of bloating in the abdomen.   Passage of more gas than usual.   Spotting of blood in your stool or on the toilet paper  .  IF YOU HAD POLYPS REMOVED DURING THE COLONOSCOPY:  Eat a soft diet IF YOU HAVE NAUSEA, BLOATING, ABDOMINAL PAIN, OR VOMITING.    FINDING OUT THE RESULTS OF YOUR TEST Not all test results are  available during your visit. DR. Oneida Alar WILL CALL YOU WITHIN 14 DAYS OF YOUR PROCEDUE WITH YOUR RESULTS. Do not assume everything is normal if you have not heard from DR. Kieffer Blatz, CALL HER OFFICE AT [phone].  SEEK IMMEDIATE MEDICAL ATTENTION AND CALL THE OFFICE: [phone] IF:  You have more than a spotting of blood in your stool.   Your belly is swollen (abdominal distention).   You are nauseated or vomiting.   You have a temperature over 101F.   You have abdominal pain or discomfort that is severe or gets worse throughout the day.  High-Fiber Diet A high-fiber diet changes your normal diet to include more whole grains, legumes, fruits, and vegetables. Changes in the diet involve replacing refined carbohydrates with unrefined foods. The calorie level of the diet is essentially unchanged. The Dietary Reference Intake (recommended amount) for adult males is 38 grams per day. For adult females, it is 25 grams per day. Pregnant and lactating women should consume 28 grams of fiber per day. Fiber is the intact part of a plant that is not broken down during digestion. Functional fiber is fiber that has been isolated from the plant to provide a beneficial effect in the body.  PURPOSE  Increase stool bulk.   Ease and regulate bowel movements.   Lower cholesterol.   REDUCE RISK OF COLON CANCER  INDICATIONS THAT YOU NEED MORE FIBER  Constipation  and hemorrhoids.   Uncomplicated diverticulosis (intestine condition) and irritable bowel syndrome.   Weight management.   As a protective measure against hardening of the arteries (atherosclerosis), diabetes, and cancer.   GUIDELINES FOR INCREASING FIBER IN THE DIET  Start adding fiber to the diet slowly. A gradual increase of about 5 more grams (2 servings of most fruits or vegetables) per day is best. Too rapid an increase in fiber may result in constipation, flatulence, and bloating.   Drink enough water and fluids to keep your urine  clear or pale yellow. Water, juice, or caffeine-free drinks are recommended. Not drinking enough fluid may cause constipation.   Eat a variety of high-fiber foods rather than one type of fiber.   Try to increase your intake of fiber through using high-fiber foods rather than fiber pills or supplements that contain small amounts of fiber.   The goal is to change the types of food eaten. Do not supplement your present diet with high-fiber foods, but replace foods in your present diet.

## 2019-01-05 NOTE — Op Note (Signed)
[facility] Patient Name: Wendy Oneill Procedure Date: 01/05/2019 11:00 AM MRN: [id number] Date of Birth: [birth year] Attending MD: Barney Drain MD, MD CSN: [id number] Age: 63 Admit Type: Outpatient Procedure:                Colonoscopy, SURVEILLANCE Indications:              Personal history of colonic polyps Providers:                Barney Drain MD, MD, Hinton Rao, RN, Ralene Bathe, Technician, Randa Spike, Technician Referring MD:             Norwood Levo. Simpson MD, MD Medicines:                Propofol per Anesthesia Complications:            No immediate complications. Estimated Blood Loss:     Estimated blood loss: none. Procedure:                Pre-Anesthesia Assessment:                           - Prior to the procedure, a History and Physical                            was performed, and patient medications and                            allergies were reviewed. The patient's tolerance of                            previous anesthesia was also reviewed. The risks                            and benefits of the procedure and the sedation                            options and risks were discussed with the patient.                            All questions were answered, and informed consent                            was obtained. Prior Anticoagulants: The patient has                            taken no previous anticoagulant or antiplatelet                            agents except for aspirin. ASA Grade Assessment: II                            - A patient with mild systemic disease. After  reviewing the risks and benefits, the patient was                            deemed in satisfactory condition to undergo the                            procedure. After obtaining informed consent, the                            colonoscope was passed under direct vision.                            Throughout the procedure, the  patient's blood                            pressure, pulse, and oxygen saturations were                            monitored continuously. The [id number]-[id number]                            scope was introduced through the anus and advanced                            to the the cecum, identified by appendiceal orifice                            and ileocecal valve. The colonoscopy was somewhat                            difficult due to a tortuous colon. Successful                            completion of the procedure was aided by                            straightening and shortening the scope to obtain                            bowel loop reduction and COLOWRAP. The patient                            tolerated the procedure well. The quality of the                            bowel preparation was good. The ileocecal valve,                            appendiceal orifice, and rectum were photographed. Scope In: 11:36:24 AM Scope Out: 11:56:37 AM Scope Withdrawal Time: 0 hours 16 minutes 11 seconds  Total Procedure Duration: 0 hours 20 minutes 13 seconds  Findings:      The recto-sigmoid colon, sigmoid colon and descending colon were       moderately tortuous.  The exam was otherwise without abnormality. Impression:               - Tortuous colon.                           - The examination was otherwise normal. Moderate Sedation:      Per Anesthesia Care Recommendation:           - Patient has a contact number available for                            emergencies. The signs and symptoms of potential                            delayed complications were discussed with the                            patient. Return to normal activities tomorrow.                            Written discharge instructions were provided to the                            patient.                           - High fiber diet.                           - Continue present medications.                            - Repeat colonoscopy in 5-10 years for surveillance. Procedure Code(s):        --- Professional ---                           [phone], Colonoscopy, flexible; diagnostic, including                            collection of specimen(s) by brushing or washing,                            when performed (separate procedure) Diagnosis Code(s):        --- Professional ---                           Z86.010, Personal history of colonic polyps                           Q43.8, Other specified congenital malformations of                            intestine CPT copyright 2019 American Medical Association. All rights reserved. The codes documented in this report are preliminary and upon coder review may  be revised to meet current compliance requirements. Barney Drain, MD Barney Drain MD, MD 01/05/2019 12:03:12 PM This report has been signed electronically.  Number of Addenda: 0 

## 2019-01-05 NOTE — Anesthesia Preprocedure Evaluation (Signed)
Anesthesia Evaluation  Patient identified by MRN, date of birth, ID band Patient awake    Reviewed: Allergy & Precautions, NPO status , Patient's Chart, lab work & pertinent test results  Airway Mallampati: III  TM Distance: >3 FB Neck ROM: Full    Dental no notable dental hx. (+) Edentulous Upper, Edentulous Lower   Pulmonary neg pulmonary ROS,  Reports occ inhaler use for chronic bronchitis  Denies smoking or o2 need    Pulmonary exam normal breath sounds clear to auscultation       Cardiovascular Exercise Tolerance: Good hypertension, Normal cardiovascular examI Rhythm:Regular Rate:Normal     Neuro/Psych PSYCHIATRIC DISORDERS Depression Bipolar Disorder Schizophrenia negative neurological ROS     GI/Hepatic Neg liver ROS, GERD  Medicated and Controlled,  Endo/Other  negative endocrine ROSdiabetes, Type 2, Oral Hypoglycemic Agents  Renal/GU negative Renal ROS  negative genitourinary   Musculoskeletal negative musculoskeletal ROS (+)   Abdominal   Peds negative pediatric ROS (+)  Hematology negative hematology ROS (+)   Anesthesia Other Findings   Reproductive/Obstetrics negative OB ROS                             Anesthesia Physical Anesthesia Plan  ASA: II  Anesthesia Plan: General   Post-op Pain Management:    Induction: Intravenous  PONV Risk Score and Plan: 3 and Ondansetron, Propofol infusion, TIVA and Treatment may vary due to age or medical condition  Airway Management Planned: Nasal Cannula and Simple Face Mask  Additional Equipment:   Intra-op Plan:   Post-operative Plan:   Informed Consent: I have reviewed the patients History and Physical, chart, labs and discussed the procedure including the risks, benefits and alternatives for the proposed anesthesia with the patient or authorized representative who has indicated his/her understanding and acceptance.      Dental advisory given  Plan Discussed with: CRNA  Anesthesia Plan Comments: (Plan Full PPE use  Plan GA with GETA as needed d/w pt -WTP with same after Q&A)        Anesthesia Quick Evaluation

## 2019-01-05 NOTE — Anesthesia Postprocedure Evaluation (Signed)
Anesthesia Post Note  Patient: Wendy Oneill  Procedure(s) Performed: COLONOSCOPY WITH PROPOFOL (N/A )  Patient location during evaluation: PACU Anesthesia Type: MAC Level of consciousness: awake, oriented and awake and alert Pain management: pain level controlled Vital Signs Assessment: post-procedure vital signs reviewed and stable Respiratory status: spontaneous breathing, respiratory function stable and nonlabored ventilation Cardiovascular status: stable Postop Assessment: no apparent nausea or vomiting Anesthetic complications: no     Last Vitals:  Vitals:   01/05/19 1017 01/05/19 1200  BP: (!) 146/81   Pulse: 86   Resp:  19  Temp: 37.1 C 36.8 C  SpO2: 96%     Last Pain:  Vitals:   01/05/19 1157  PainSc: 0-No pain                 Leman Martinek

## 2019-01-05 NOTE — OR Nursing (Signed)
Patient placed roll of money in bra as she was dressing.

## 2019-01-09 ENCOUNTER — Other Ambulatory Visit: Payer: Self-pay | Admitting: Family Medicine

## 2019-01-24 ENCOUNTER — Other Ambulatory Visit: Payer: Self-pay | Admitting: Family Medicine

## 2019-02-15 NOTE — Telephone Encounter (Signed)
error 

## 2019-02-23 ENCOUNTER — Other Ambulatory Visit: Payer: Self-pay | Admitting: Family Medicine

## 2019-03-02 ENCOUNTER — Other Ambulatory Visit: Payer: Self-pay

## 2019-03-02 ENCOUNTER — Telehealth: Payer: Self-pay

## 2019-03-02 MED ORDER — CALCIUM CITRATE-VITAMIN D 315-250 MG-UNIT PO TABS: ORAL_TABLET | ORAL | 0 refills | Status: DC

## 2019-03-02 MED ORDER — DOCUSATE SODIUM 100 MG PO CAPS: ORAL_CAPSULE | ORAL | 0 refills | Status: DC

## 2019-03-02 NOTE — Telephone Encounter (Signed)
Please call Butch Penny Regarding some [phone]

## 2019-03-02 NOTE — Telephone Encounter (Signed)
Medications refilled and sent to pharmacy with verification received from pharmacy

## 2019-03-27 ENCOUNTER — Other Ambulatory Visit: Payer: Self-pay | Admitting: Family Medicine

## 2019-04-21 ENCOUNTER — Other Ambulatory Visit: Payer: Self-pay | Admitting: Family Medicine

## 2019-04-25 ENCOUNTER — Other Ambulatory Visit: Payer: Self-pay | Admitting: Family Medicine

## 2019-04-26 LAB — LIPID PANEL W/O CHOL/HDL RATIO
Cholesterol, Total: 158 mg/dL (ref 100–199)
HDL: 44 mg/dL (ref >39)
LDL Chol Calc (NIH): 88 mg/dL (ref 0–99)
Triglycerides: 147 mg/dL (ref 0–149)
VLDL Cholesterol Cal: 26 mg/dL (ref 5–40)

## 2019-04-26 LAB — CBC WITH DIFFERENTIAL/PLATELET
Basophils Absolute: 0 10*3/uL (ref 0.0–0.2)
Basos: 0 %
EOS (ABSOLUTE): 0.1 10*3/uL (ref 0.0–0.4)
Eos: 2 %
Hematocrit: 39 % (ref 34.0–46.6)
Hemoglobin: 13.5 g/dL (ref 11.1–15.9)
Immature Grans (Abs): 0 10*3/uL (ref 0.0–0.1)
Immature Granulocytes: 0 %
Lymphocytes Absolute: 3 10*3/uL (ref 0.7–3.1)
Lymphs: 51 %
MCH: 31.3 pg (ref 26.6–33.0)
MCHC: 34.6 g/dL (ref 31.5–35.7)
MCV: 91 fL (ref 79–97)
Monocytes Absolute: 0.5 10*3/uL (ref 0.1–0.9)
Monocytes: 8 %
Neutrophils Absolute: 2.4 10*3/uL (ref 1.4–7.0)
Neutrophils: 39 %
Platelets: 274 10*3/uL (ref 150–450)
RBC: 4.31 x10E6/uL (ref 3.77–5.28)
RDW: 12 % (ref 11.7–15.4)
WBC: 6 10*3/uL (ref 3.4–10.8)

## 2019-04-26 LAB — COMPREHENSIVE METABOLIC PANEL
ALT: 38 IU/L — ABNORMAL HIGH (ref 0–32)
AST: 30 IU/L (ref 0–40)
Albumin/Globulin Ratio: 1.6 (ref 1.2–2.2)
Albumin: 4.2 g/dL (ref 3.8–4.8)
Alkaline Phosphatase: 68 IU/L (ref 39–117)
BUN/Creatinine Ratio: 10 — ABNORMAL LOW (ref 12–28)
BUN: 7 mg/dL — ABNORMAL LOW (ref 8–27)
Bilirubin Total: 0.2 mg/dL (ref 0.0–1.2)
CO2: 25 mmol/L (ref 20–29)
Calcium: 10.2 mg/dL (ref 8.7–10.3)
Chloride: 102 mmol/L (ref 96–106)
Creatinine, Ser: 0.7 mg/dL (ref 0.57–1.00)
GFR calc Af Amer: 108 mL/min/{1.73_m2} (ref >59)
GFR calc non Af Amer: 94 mL/min/{1.73_m2} (ref >59)
Globulin, Total: 2.7 g/dL (ref 1.5–4.5)
Glucose: 101 mg/dL — ABNORMAL HIGH (ref 65–99)
Potassium: 4.7 mmol/L (ref 3.5–5.2)
Sodium: 142 mmol/L (ref 134–144)
Total Protein: 6.9 g/dL (ref 6.0–8.5)

## 2019-04-26 LAB — HGB A1C W/O EAG: Hgb A1c MFr Bld: 7.5 % — ABNORMAL HIGH (ref 4.8–5.6)

## 2019-04-26 LAB — TSH: TSH: 0.473 u[IU]/mL (ref 0.450–4.500)

## 2019-04-26 LAB — SPECIMEN STATUS REPORT

## 2019-04-27 ENCOUNTER — Ambulatory Visit: Payer: Medicaid Other | Admitting: Family Medicine

## 2019-05-04 ENCOUNTER — Ambulatory Visit: Payer: Medicaid Other | Admitting: Family Medicine

## 2019-05-09 ENCOUNTER — Telehealth (INDEPENDENT_AMBULATORY_CARE_PROVIDER_SITE_OTHER): Payer: Medicaid Other | Admitting: Family Medicine

## 2019-05-09 ENCOUNTER — Other Ambulatory Visit: Payer: Self-pay

## 2019-05-09 ENCOUNTER — Encounter: Payer: Self-pay | Admitting: Family Medicine

## 2019-05-09 VITALS — BP 134/71 | Ht 62.0 in | Wt 129.0 lb

## 2019-05-09 DIAGNOSIS — E785 Hyperlipidemia, unspecified: Secondary | ICD-10-CM

## 2019-05-09 DIAGNOSIS — L84 Corns and callosities: Secondary | ICD-10-CM

## 2019-05-09 DIAGNOSIS — E11628 Type 2 diabetes mellitus with other skin complications: Secondary | ICD-10-CM | POA: Diagnosis not present

## 2019-05-09 DIAGNOSIS — I10 Essential (primary) hypertension: Secondary | ICD-10-CM | POA: Diagnosis not present

## 2019-05-09 DIAGNOSIS — K219 Gastro-esophageal reflux disease without esophagitis: Secondary | ICD-10-CM

## 2019-05-09 DIAGNOSIS — F310 Bipolar disorder, current episode hypomanic: Secondary | ICD-10-CM

## 2019-05-09 NOTE — Assessment & Plan Note (Signed)
Wendy Oneill is encouraged to maintain a well balanced diet that is low in salt. Controlled, continue current medication regimen.  Additionally, she is also reminded that exercise is beneficial for heart health and control of  Blood pressure. 30-60 minutes daily is recommended-walking was suggested.

## 2019-05-09 NOTE — Assessment & Plan Note (Signed)
Educated on taking medication on empty stomach 1-2 hours before eating and other medications See if this helps with GERD and nausea symptoms.   Reviewed side effects, risks and benefits of medication.   Patient acknowledged agreement and understanding of the plan.

## 2019-05-09 NOTE — Assessment & Plan Note (Signed)
Encouraged low fat diet and ordered updated labs

## 2019-05-09 NOTE — Assessment & Plan Note (Signed)
Continued management by Psych, stable. Encouraged to always take her medications as directed.

## 2019-05-09 NOTE — Assessment & Plan Note (Signed)
Noted well controlled, needs to get updated labs Continue current medication as directed.  Wendy Oneill is encouraged to check blood sugar daily as directed. Continue current medications. Is on statin as well. She is reminded the importance of maintaining  good blood sugars,  taking medications as directed, daily foot care, annual eye exams. Additionally educated about keeping good control over blood pressure and cholesterol as well.

## 2019-05-09 NOTE — Progress Notes (Signed)
Virtual Visit via Telephone Note   This visit type was conducted due to national recommendations for restrictions regarding the COVID-19 Pandemic (e.g. social distancing) in an effort to limit this patient's exposure and mitigate transmission in our community.  Due to her co-morbid illnesses, this patient is at least at moderate risk for complications without adequate follow up.  This format is felt to be most appropriate for this patient at this time.  The patient did not have access to video technology/had technical difficulties with video requiring transitioning to audio format only (telephone).  All issues noted in this document were discussed and addressed.  No physical exam could be performed with this format.  Evaluation Performed:  Follow-up visit  Date:  05/09/2019   ID:  Wendy Oneill, DOB 1955/05/26, MRN [id number]  Patient Location: Home Provider Location: Office  Location of Patient: Home Location of Provider: Telehealth Consent was obtain for visit to be over via telehealth. I verified that I am speaking with the correct person using two identifiers.  PCP:  Fayrene Helper, MD   Chief Complaint:  diabetes  History of Present Illness:    Wendy Oneill is a 64 y.o. female with history of diabetes, GERD, hyperlipidemia, schizophrenia.  Reports today that she is doing well but she has had some upset stomach has not been taking her GERD medication alone or prior to eating. Mostly nausea has vomited once or twice but overall he gets a little bit better after eating or she goes to lay down.  Additionally she reports that she is seeing black spots on and off and this usually occurs after eating as well.  She denies having any blurriness.  Has not been to an eye doctor in a little while per her.  Is open to getting set up for that. Reports taking all medications. Denies chest pain, Shortness of breath or leg swelling, headaches, dizziness.  The patient does not have symptoms  concerning for COVID-19 infection (fever, chills, cough, or new shortness of breath).   Past Medical, Surgical, Social History, Allergies, and Medications have been Reviewed.  Past Medical History:  Diagnosis Date  . Adenoma May 2010   Simple : TCS   . Allergic rhinitis   . Diabetes mellitus, type 1   . GERD (gastroesophageal reflux disease)   . Hyperlipidemia   . Personal history of schizophrenia   . Schizophrenia [facility])    Past Surgical History:  Procedure Laterality Date  . COLONOSCOPY  MAY 2010   SIMPLE ADENOMAS  . COLONOSCOPY WITH PROPOFOL N/A 01/05/2019   Procedure: COLONOSCOPY WITH PROPOFOL;  Surgeon: Danie Binder, MD;  Location: [facility];  Service: Endoscopy;  Laterality: N/A;  10:45am  . MULTIPLE TOOTH EXTRACTIONS  Feb 03, 2010     Current Meds  Medication Sig  . ACCU-CHEK AVIVA PLUS test strip USE TO TEST ONCE DAILY.  Marland Kitchen Accu-Chek Softclix Lancets lancets USE TO TEST ONCE DAILY.  Marland Kitchen ASPIRIN LOW DOSE 81 MG EC tablet TAKE (1) TABLET BY MOUTH EACH MORNING.  . Calcium Citrate-Vitamin D (CALCIUM CITRATE + D3 MAXIMUM) 315-250 MG-UNIT TABS TAKE (1) TABLET BY MOUTH (3) TIMES DAILY.  Marland Kitchen COGENTIN 0.5 MG tablet TAKE (1) TABLET BY MOUTH TWICE DAILY. (Patient taking differently: Take 0.5 mg by mouth 2 (two) times daily. )  . docusate sodium (COLACE) 100 MG capsule TAKE (1) CAPSULE BY MOUTH TWICE DAILY.  Marland Kitchen escitalopram (LEXAPRO) 10 MG tablet TAKE (1) TABLET BY MOUTH EACH MORNING. (  Patient taking differently: Take 10 mg by mouth daily. )  . famotidine (PEPCID) 20 MG tablet TAKE 1 TABLET BY MOUTH TWICE DAILY. (Patient taking differently: Take 20 mg by mouth 2 (two) times daily. )  . ferrous sulfate 325 (65 FE) MG tablet One daily (Patient taking differently: Take 325 mg by mouth daily with breakfast. )  . JANUVIA 25 MG tablet TAKE 1 TABLET BY MOUTH ONCE DAILY.  Marland Kitchen lisinopril (ZESTRIL) 2.5 MG tablet TAKE 1 TABLET BY MOUTH ONCE DAILY.  Marland Kitchen loratadine (ALLERGY RELIEF) 10 MG tablet TAKE  (1) TABLET BY MOUTH ONCE DAILY.  Marland Kitchen lovastatin (MEVACOR) 20 MG tablet TAKE 1 TABLET BY MOUTH AT BEDTIME.  . metFORMIN (GLUCOPHAGE) 1000 MG tablet Take 1 tablet (1,000 mg total) by mouth 2 (two) times daily with a meal.  . omeprazole (PRILOSEC) 20 MG capsule TAKE 1 CAPSULE BY MOUTH ONCE DAILY 30 MINUTES BEFORE FIRST MEAL.  Marland Kitchen PAIN RELIEF EXTRA STRENGTH 500 MG tablet TAKE 1 TABLET BY MOUTH ONCE DAILY AS NEEDED FOR PAIN. (Patient taking differently: Take 500 mg by mouth daily as needed for moderate pain. )  . risperiDONE (RISPERDAL) 2 MG tablet Take 2 mg by mouth 2 (two) times daily.   Marland Kitchen UNABLE TO FIND Diabetic shoes with orthotic inserts (x 3) based on insurance coverage     Allergies:   Risperidone and Sulfonamide derivatives   ROS:   Please see the history of present illness.    All other systems reviewed and are negative.   Labs/Other Tests and Data Reviewed:    Recent Labs: 04/25/2019: ALT 38; BUN 7; Creatinine, Ser 0.70; Hemoglobin 13.5; Platelets 274; Potassium 4.7; Sodium 142; TSH 0.473   Recent Lipid Panel Lab Results  Component Value Date/Time   CHOL 158 04/25/2019 02:39 PM   TRIG 147 04/25/2019 02:39 PM   HDL 44 04/25/2019 02:39 PM   CHOLHDL 3.4 01/04/2017 11:36 AM   LDLCALC 88 04/25/2019 02:39 PM   LDLCALC 84 01/04/2017 11:36 AM    Wt Readings from Last 3 Encounters:  05/09/19 129 lb (58.5 kg)  12/21/18 129 lb (58.5 kg)  12/06/18 133 lb (60.3 kg)     Objective:    Vital Signs:  BP (!) 164/71   Ht 5\' 2"  (1.575 m)   Wt 129 lb (58.5 kg)   LMP 07/06/2012   BMI 23.59 kg/m    VITAL SIGNS:  reviewed GEN:  alert  RESPIRATORY:  no shortness of breath in conversation PSYCH:  good mood, pleasant in converastion   ASSESSMENT & PLAN:    1. 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 Bipolar disorder, current episode hypomanic ([place])   3. Gastroesophageal reflux disease, unspecified whether esophagitis present  4. Essential hypertension   5.  Hyperlipidemia LDL goal <100   Time:   Today, I have spent 20 minutes with the patient with telehealth technology discussing the above problems.     Medication Adjustments/Labs and Tests Ordered: Current medicines are reviewed at length with the patient today.  Concerns regarding medicines are outlined above.   Tests Ordered: No orders of the defined types were placed in this encounter.   Medication Changes: No orders of the defined types were placed in this encounter.   Disposition:  Follow up 5 months in office  Signed, Perlie Mayo, NP  05/09/2019 4:27 PM     [facility]

## 2019-05-10 ENCOUNTER — Encounter: Payer: Self-pay | Admitting: Family Medicine

## 2019-05-10 NOTE — Patient Instructions (Addendum)
I appreciate the opportunity to provide you with care for your health and wellness. Today we discussed: overall health   Follow up: 5 months in office   No labs or referrals today  Needs DM eye exam soon  Please continue to practice social distancing to keep you, your family, and our community safe.  If you must go out, please wear a mask and practice good handwashing.  It was a pleasure to see you and I look forward to continuing to work together on your health and well-being. Please do not hesitate to call the office if you need care or have questions about your care.  Have a wonderful day and week. With Gratitude, Cherly Beach, DNP, AGNP-BC

## 2019-05-24 ENCOUNTER — Telehealth: Payer: Self-pay

## 2019-05-24 NOTE — Telephone Encounter (Signed)
Does the pt still need daily finger sticks, as she had it on the FL2 in 2015, but its not on her orders now.  Please advise

## 2019-05-25 NOTE — Telephone Encounter (Signed)
Yes she needs once daily finger sticks

## 2019-05-29 ENCOUNTER — Other Ambulatory Visit: Payer: Self-pay | Admitting: Family Medicine

## 2019-05-29 DIAGNOSIS — K219 Gastro-esophageal reflux disease without esophagitis: Secondary | ICD-10-CM

## 2019-05-29 NOTE — Telephone Encounter (Signed)
Order sent for testing to [facility]

## 2019-06-22 ENCOUNTER — Other Ambulatory Visit: Payer: Self-pay

## 2019-06-22 ENCOUNTER — Ambulatory Visit (INDEPENDENT_AMBULATORY_CARE_PROVIDER_SITE_OTHER): Payer: Medicaid Other | Admitting: Family Medicine

## 2019-06-22 ENCOUNTER — Encounter: Payer: Self-pay | Admitting: Family Medicine

## 2019-06-22 VITALS — BP 124/84 | HR 87 | Temp 97.3°F | Resp 15 | Ht 63.0 in | Wt 128.0 lb

## 2019-06-22 DIAGNOSIS — E11628 Type 2 diabetes mellitus with other skin complications: Secondary | ICD-10-CM

## 2019-06-22 DIAGNOSIS — I10 Essential (primary) hypertension: Secondary | ICD-10-CM

## 2019-06-22 DIAGNOSIS — M79642 Pain in left hand: Secondary | ICD-10-CM

## 2019-06-22 DIAGNOSIS — L84 Corns and callosities: Secondary | ICD-10-CM

## 2019-06-22 DIAGNOSIS — K219 Gastro-esophageal reflux disease without esophagitis: Secondary | ICD-10-CM

## 2019-06-22 DIAGNOSIS — E785 Hyperlipidemia, unspecified: Secondary | ICD-10-CM | POA: Diagnosis not present

## 2019-06-22 DIAGNOSIS — F251 Schizoaffective disorder, depressive type: Secondary | ICD-10-CM

## 2019-06-22 DIAGNOSIS — M79641 Pain in right hand: Secondary | ICD-10-CM

## 2019-06-22 NOTE — Patient Instructions (Addendum)
F/U  end September, call if you need me before    Congrats on improved blood sugar, keep it up!  Please let us know when you got the [place] vaccines, so we can  Document this in your record    Fasting  lipid, cmp and eGFr and hBa1C 1 week before next visit   Congrats on improved blood sugar, no med change  Be careful not to fall  Thanks for choosing [facility], we consider it a privelige to serve you.

## 2019-06-23 DIAGNOSIS — M79642 Pain in left hand: Secondary | ICD-10-CM

## 2019-06-23 DIAGNOSIS — M79641 Pain in right hand: Secondary | ICD-10-CM

## 2019-06-23 HISTORY — DX: Pain in right hand: M79.641

## 2019-06-23 HISTORY — DX: Pain in right hand: M79.642

## 2019-06-23 NOTE — Assessment & Plan Note (Signed)
Hyperlipidemia:Low fat diet discussed and encouraged.   Lipid Panel  Lab Results  Component Value Date   CHOL 158 04/25/2019   HDL 44 04/25/2019   LDLCALC 88 04/25/2019   TRIG 147 04/25/2019   CHOLHDL 3.4 01/04/2017  Controlled, no change in medication

## 2019-06-23 NOTE — Assessment & Plan Note (Signed)
blateral muscle wasting and mild deformity of IP joints, tylenol as needed, and hand massage

## 2019-06-23 NOTE — Assessment & Plan Note (Signed)
Stable, controled and managed by psych

## 2019-06-23 NOTE — Progress Notes (Signed)
Wendy Oneill     MRN: [id number]      DOB: [birth year]   HPI Wendy Oneill is here for follow up and re-evaluation of chronic medical conditions, medication management and review of any available recent lab and radiology data.  Preventive health is updated, specifically  Cancer screening and Immunization.   Questions or concerns regarding consultations or procedures which the PT has had in the interim are  addressed. The PT denies any adverse reactions to current medications since the last visit.  C/o bilateral hand pain intermittently x 2 months, no specific aggravating factor, shakes hands for relief, does not awaken her  Denies polyuria, polydipsia, blurred vision , or hypoglycemic episodes.   ROS Denies recent fever or chills. Denies sinus pressure, nasal congestion, ear pain or sore throat. Denies chest congestion, productive cough or wheezing. Denies chest pains, palpitations and leg swelling Denies abdominal pain, nausea, vomiting,diarrhea or constipation.   Denies dysuria, frequency, hesitancy or incontinence. . Denies headaches, seizures, numbness, or tingling. Denies uncontrolled  depression, anxiety or insomnia. Denies skin break down or rash.   PE  BP 124/84   Pulse 87   Temp (!) 97.3 F (36.3 C) (Temporal)   Resp 15   Ht 5\' 3"  (1.6 m)   Wt 128 lb (58.1 kg)   LMP 07/06/2012   SpO2 98%   BMI 22.67 kg/m   Patient alert and oriented and in no cardiopulmonary distress.  HEENT: No facial asymmetry, EOMI,     Neck supple .  Chest: Clear to auscultation bilaterally.  CVS: S1, S2 no murmurs, no S3.Regular rate.  ABD: Soft non tender.   Ext: No edema  MS: Adequate ROM spine, shoulders, hips and knees.full rOM of wrists, mild thenar wasting bilaterally, negative hofmann's and Phalen's test Skin: Intact, no ulcerations or rash noted.  Psych: Good eye contact, normal affect. Memory intact not anxious or depressed appearing.  CNS: CN 2-12 intact, power,  normal  throughout.no focal deficits noted.   Assessment & Plan  Essential hypertension Controlled, no change in medication DASH diet and commitment to daily physical activity for a minimum of 30 minutes discussed and encouraged, as a part of hypertension management. The importance of attaining a healthy weight is also discussed.  BP/Weight 06/22/2019 05/09/2019 01/05/2019 12/21/2018 12/06/2018 10/12/2018 6/33/3545  Systolic BP 625 638 937 342 876 811 572  Diastolic BP 84 71 71 80 78 77 82  Wt. (Lbs) 128 129 - 129 133 132.8 135  BMI 22.67 23.59 - 23.59 24.33 24.29 24.69       Hyperlipidemia LDL goal <100 Hyperlipidemia:Low fat diet discussed and encouraged.   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   The need to take medication as prescribed, test blood sugar as directed, and to call between visits if there is a concern that blood sugar is uncontrolled is also discussed.   Wendy Oneill is reminded of the importance of daily foot exam, annual eye examination, and good blood sugar, blood pressure and  cholesterol control. Improved and controlled, no med change  Diabetic Labs Latest Ref Rng & Units 04/25/2019 12/21/2018 08/12/2018 02/09/2018 10/25/2017  HbA1c 4.8 - 5.6 % 7.5(H) 7.8(H) 8.3(H) 7.4(H) 6.7(H)  Microalbumin Not Estab. ug/mL - <3.0(H) - - -  Micro/Creat Ratio 0 - 29 mg/g creat - <9 - - -  Chol 100 - 199 mg/dL 158 - - 153 -  HDL >39 mg/dL 44 - - 52 -  Calc LDL 0 - 99 mg/dL 88 - - 76 -  Triglycerides 0 - 149 mg/dL 147 - - 123 -  Creatinine 0.57 - 1.00 mg/dL  0.70 0.80 0.81 0.89 0.82   BP/Weight 06/22/2019 05/09/2019 01/05/2019 12/21/2018 12/06/2018 10/12/2018 08/26/8108  Systolic BP 315 945 859 292 446 286 381  Diastolic BP 84 71 71 80 78 77 82  Wt. (Lbs) 128 129 - 129 133 132.8 135  BMI 22.67 23.59 - 23.59 24.33 24.29 24.69   Foot/eye exam completion dates Latest Ref Rng & Units 12/21/2018 03/16/2018  Eye Exam No Retinopathy - -  Foot exam Order - - -  Foot Form Completion - Done Done        Gastro-esophageal reflux Controlled, no change in medication   Schizoaffective disorder, depressive type ([facility]) Stable, controled and managed by psych  Bilateral hand pain blateral muscle wasting and mild deformity of IP joints, tylenol as needed, and hand massage

## 2019-06-23 NOTE — Assessment & Plan Note (Signed)
Controlled, no change in medication  

## 2019-06-23 NOTE — Assessment & Plan Note (Signed)
Controlled, no change in medication DASH diet and commitment to daily physical activity for a minimum of 30 minutes discussed and encouraged, as a part of hypertension management. The importance of attaining a healthy weight is also discussed.  BP/Weight 06/22/2019 05/09/2019 01/05/2019 12/21/2018 12/06/2018 10/12/2018 1/61/0960  Systolic BP 454 098 119 147 829 562 130  Diastolic BP 84 71 71 80 78 77 82  Wt. (Lbs) 128 129 - 129 133 132.8 135  BMI 22.67 23.59 - 23.59 24.33 24.29 24.69

## 2019-06-23 NOTE — Assessment & Plan Note (Signed)
Wendy Oneill is reminded of the importance of commitment to daily physical activity for 30 minutes or more, as able and the need to limit carbohydrate intake to 30 to 60 grams per meal to help with blood sugar control.   The need to take medication as prescribed, test blood sugar as directed, and to call between visits if there is a concern that blood sugar is uncontrolled is also discussed.   Wendy Oneill is reminded of the importance of daily foot exam, annual eye examination, and good blood sugar, blood pressure and cholesterol control. Improved and controlled, no med change  Diabetic Labs Latest Ref Rng & Units 04/25/2019 12/21/2018 08/12/2018 02/09/2018 10/25/2017  HbA1c 4.8 - 5.6 % 7.5(H) 7.8(H) 8.3(H) 7.4(H) 6.7(H)  Microalbumin Not Estab. ug/mL - <3.0(H) - - -  Micro/Creat Ratio 0 - 29 mg/g creat - <9 - - -  Chol 100 - 199 mg/dL 158 - - 153 -  HDL >39 mg/dL 44 - - 52 -  Calc LDL 0 - 99 mg/dL 88 - - 76 -  Triglycerides 0 - 149 mg/dL 147 - - 123 -  Creatinine 0.57 - 1.00 mg/dL 0.70 0.80 0.81 0.89 0.82   BP/Weight 06/22/2019 05/09/2019 01/05/2019 12/21/2018 12/06/2018 10/12/2018 4/81/8563  Systolic BP 149 702 637 858 850 277 412  Diastolic BP 84 71 71 80 78 77 82  Wt. (Lbs) 128 129 - 129 133 132.8 135  BMI 22.67 23.59 - 23.59 24.33 24.29 24.69   Foot/eye exam completion dates Latest Ref Rng & Units 12/21/2018 03/16/2018  Eye Exam No Retinopathy - -  Foot exam Order - - -  Foot Form Completion - Done Done

## 2019-08-26 LAB — COMPLETE METABOLIC PANEL WITH GFR
AG Ratio: 1.3 (calc) (ref 1.0–2.5)
ALT: 36 U/L — ABNORMAL HIGH (ref 6–29)
AST: 28 U/L (ref 10–35)
Albumin: 4.2 g/dL (ref 3.6–5.1)
Alkaline phosphatase (APISO): 61 U/L (ref 37–153)
BUN: 11 mg/dL (ref 7–25)
CO2: 26 mmol/L (ref 20–32)
Calcium: 10.8 mg/dL — ABNORMAL HIGH (ref 8.6–10.4)
Chloride: 105 mmol/L (ref 98–110)
Creat: 0.83 mg/dL (ref 0.50–0.99)
GFR, Est African American: 88 mL/min/{1.73_m2} (ref >=60)
GFR, Est Non African American: 76 mL/min/{1.73_m2} (ref >=60)
Globulin: 3.3 g/dL (calc) (ref 1.9–3.7)
Glucose, Bld: 113 mg/dL — ABNORMAL HIGH (ref 65–99)
Potassium: 5.7 mmol/L — ABNORMAL HIGH (ref 3.5–5.3)
Sodium: 143 mmol/L (ref 135–146)
Total Bilirubin: 0.3 mg/dL (ref 0.2–1.2)
Total Protein: 7.5 g/dL (ref 6.1–8.1)

## 2019-08-26 LAB — LIPID PANEL
Cholesterol: 177 mg/dL (ref <200)
HDL: 48 mg/dL — ABNORMAL LOW (ref >=50)
LDL Cholesterol (Calc): 105 mg/dL (calc) — ABNORMAL HIGH
Non-HDL Cholesterol (Calc): 129 mg/dL (calc) (ref <130)
Total CHOL/HDL Ratio: 3.7 (calc) (ref <5.0)
Triglycerides: 137 mg/dL (ref <150)

## 2019-08-26 LAB — HEMOGLOBIN A1C
Hgb A1c MFr Bld: 8.3 % of total Hgb — ABNORMAL HIGH (ref <5.7)
Mean Plasma Glucose: 192 (calc)
eAG (mmol/L): 10.6 (calc)

## 2019-08-28 ENCOUNTER — Other Ambulatory Visit: Payer: Self-pay | Admitting: Family Medicine

## 2019-08-28 NOTE — Progress Notes (Signed)
Will bring in for office visit as med dose needs to be changed

## 2019-08-30 ENCOUNTER — Other Ambulatory Visit: Payer: Self-pay

## 2019-08-30 ENCOUNTER — Encounter: Payer: Self-pay | Admitting: Family Medicine

## 2019-08-30 ENCOUNTER — Ambulatory Visit (INDEPENDENT_AMBULATORY_CARE_PROVIDER_SITE_OTHER): Payer: Medicaid Other | Admitting: Family Medicine

## 2019-08-30 VITALS — BP 113/79 | HR 101 | Resp 16 | Ht 62.0 in | Wt 129.0 lb

## 2019-08-30 DIAGNOSIS — E11628 Type 2 diabetes mellitus with other skin complications: Secondary | ICD-10-CM

## 2019-08-30 DIAGNOSIS — L84 Corns and callosities: Secondary | ICD-10-CM

## 2019-08-30 DIAGNOSIS — E785 Hyperlipidemia, unspecified: Secondary | ICD-10-CM | POA: Diagnosis not present

## 2019-08-30 DIAGNOSIS — I10 Essential (primary) hypertension: Secondary | ICD-10-CM | POA: Diagnosis not present

## 2019-08-30 DIAGNOSIS — F251 Schizoaffective disorder, depressive type: Secondary | ICD-10-CM | POA: Diagnosis not present

## 2019-08-30 MED ORDER — SITAGLIP PHOS-METFORMIN HCL ER 50-1000 MG PO TB24: 2.0000 | ORAL_TABLET | Freq: Every day | ORAL | 5 refills | Status: DC

## 2019-08-30 NOTE — Patient Instructions (Addendum)
Annual physical exam 2nd week in December in office, please cancel September appointment  Blood sugar is too high  Encourage daily walking for 30 minutes, two 15 min sessions is fine  New is janumet two every morning for blood sugar.  Stop [place] and metformin tablets  Please stop sweet drinks like apple juice and l;imit cady, cookies, ice cream and  Cakes  Need to reduce fatty food as LDL is slightly high, so need to reduce fried and fatty foods  Non fasting lipid, cmp and eGFR and HBA1C 3 days before next visit  Thanks for choosing [facility], we consider it a privelige to serve you.

## 2019-09-02 ENCOUNTER — Encounter: Payer: Self-pay | Admitting: Family Medicine

## 2019-09-02 NOTE — Assessment & Plan Note (Signed)
Ms. Wendy Oneill is reminded of the importance of commitment to daily physical activity for 30 minutes or more, as able and the need to limit carbohydrate intake to 30 to 60 grams per meal to help with blood sugar control.   The need to take medication as prescribed, test blood sugar as directed, and to call between visits if there is a concern that blood sugar is uncontrolled is also discussed.   Ms. Wendy Oneill is reminded of the importance of daily foot exam, annual eye examination, and good blood sugar, blood pressure and cholesterol control. Deteriorated, change to janumet with higher dose of januvia, and modify diet and inc exercise. F/u in 12 weeks  Diabetic Labs Latest Ref Rng & Units 08/25/2019 04/25/2019 12/21/2018 08/12/2018 02/09/2018  HbA1c <5.7 % of total Hgb 8.3(H) 7.5(H) 7.8(H) 8.3(H) 7.4(H)  Microalbumin Not Estab. ug/mL - - <3.0(H) - -  Micro/Creat Ratio 0 - 29 mg/g creat - - <9 - -  Chol <200 mg/dL 177 158 - - 153  HDL > OR = 50 mg/dL 48(L) 44 - - 52  Calc LDL mg/dL (calc) 105(H) 88 - - 76  Triglycerides <150 mg/dL 137 147 - - 123  Creatinine 0.50 - 0.99 mg/dL 0.83 0.70 0.80 0.81 0.89   BP/Weight 08/30/2019 06/22/2019 05/09/2019 01/05/2019 12/21/2018 12/06/2018 1/77/9390  Systolic BP 300 923 300 762 263 335 456  Diastolic BP 79 84 71 71 80 78 77  Wt. (Lbs) 129 128 129 - 129 133 132.8  BMI 23.59 22.67 23.59 - 23.59 24.33 24.29   Foot/eye exam completion dates Latest Ref Rng & Units 12/21/2018 03/16/2018  Eye Exam No Retinopathy - -  Foot exam Order - - -  Foot Form Completion - Done Done

## 2019-09-02 NOTE — Progress Notes (Signed)
Wendy Oneill     MRN: [id number]      DOB: [birth year]   HPI Wendy Oneill is here for follow up and re-evaluation of chronic medical conditions, medication management and review of any available recent lab and radiology data.  Blood sugar is uncontrolled, needs to be more diligent with diet esp sweet drinks Preventive health is updated, specifically  Cancer screening and Immunization.   Questions or concerns regarding consultations or procedures which the PT has had in the interim are  addressed. The PT denies any adverse reactions to current medications since the last visit.  There are no new concerns.  There are no specific complaints  Denies polyuria, polydipsia, blurred vision , or hypoglycemic episodes. States sometimes  mornin sugar is over 150  ROS Denies recent fever or chills. Denies sinus pressure, nasal congestion, ear pain or sore throat. Denies chest congestion, productive cough or wheezing. Denies chest pains, palpitations and leg swelling Denies abdominal pain, nausea, vomiting,diarrhea or constipation.   Denies dysuria, frequency, hesitancy or incontinence. Denies joint pain, swelling and limitation in mobility. Denies headaches, seizures, numbness, or tingling. Denies uncontrolled depression, anxiety or insomnia. Denies skin break down or rash.   PE  BP 113/79   Pulse (!) 101   Resp 16   Ht 5\' 2"  (1.575 m)   Wt 129 lb (58.5 kg)   LMP 07/06/2012   SpO2 98%   BMI 23.59 kg/m   Patient alert and oriented and in no cardiopulmonary distress.  HEENT: No facial asymmetry, EOMI,     Neck supple .  Chest: Clear to auscultation bilaterally.  CVS: S1, S2 no murmurs, no S3.Regular rate.  ABD: Soft non tender.   Ext: No edema  MS: Adequate ROM spine, shoulders, hips and knees.  Skin: Intact, no ulcerations or rash noted.  Psych: Good eye contact, normal affect. Memory intact not anxious or depressed appearing.  CNS: CN 2-12 intact, power,  normal  throughout.no focal deficits noted.   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   The need to take medication as prescribed, test blood sugar as directed, and to call between visits if there is a concern that blood sugar is uncontrolled is also discussed.   Wendy Oneill is reminded of the importance of daily foot exam, annual eye examination, and good blood sugar, blood pressure and cholesterol control. Deteriorated, change to janumet with higher dose of januvia, and modify diet and inc exercise. F/u in 12 weeks  Diabetic Labs Latest Ref Rng & Units 08/25/2019 04/25/2019 12/21/2018 08/12/2018 02/09/2018  HbA1c <5.7 % of total Hgb 8.3(H) 7.5(H) 7.8(H) 8.3(H) 7.4(H)  Microalbumin Not Estab. ug/mL - - <3.0(H) - -  Micro/Creat Ratio 0 - 29 mg/g creat - - <9 - -  Chol <200 mg/dL 177 158 - - 153  HDL > OR = 50 mg/dL 48(L) 44 - - 52  Calc LDL mg/dL (calc) 105(H) 88 - - 76  Triglycerides <150 mg/dL 137 147 - - 123  Creatinine 0.50 - 0.99 mg/dL 0.83 0.70 0.80 0.81 0.89   BP/Weight 08/30/2019 06/22/2019 05/09/2019 01/05/2019 12/21/2018 12/06/2018 5/68/1275  Systolic BP 170 017 494 496 759 163 846  Diastolic BP 79 84 71 71 80 78 77  Wt. (Lbs) 129 128 129 - 129 133 132.8  BMI 23.59 22.67 23.59 - 23.59 24.33 24.29   Foot/eye exam completion dates Latest Ref Rng & Units 12/21/2018 03/16/2018  Eye Exam No Retinopathy - -  Foot exam Order - - -  Foot Form Completion - Done Done        Essential hypertension Controlled, no change in medication DASH diet and commitment to daily physical activity for a minimum of 30 minutes discussed and encouraged, as a part of hypertension management. The importance of attaining a healthy weight is also discussed.  BP/Weight  08/30/2019 06/22/2019 05/09/2019 01/05/2019 12/21/2018 12/06/2018 7/85/8850  Systolic BP 277 412 878 676 720 947 096  Diastolic BP 79 84 71 71 80 78 77  Wt. (Lbs) 129 128 129 - 129 133 132.8  BMI 23.59 22.67 23.59 - 23.59 24.33 24.29       Hyperlipidemia LDL goal <100 Hyperlipidemia:Low fat diet discussed and encouraged.   Lipid Panel  Lab Results  Component Value Date   CHOL 177 08/25/2019   HDL 48 (L) 08/25/2019   LDLCALC 105 (H) 08/25/2019   TRIG 137 08/25/2019   CHOLHDL 3.7 08/25/2019  needs to reduce fried and fatty foods, not at goal     Schizoaffective disorder, depressive type ([place]) Managed by psych, controlled and stable

## 2019-09-02 NOTE — Assessment & Plan Note (Signed)
Controlled, no change in medication DASH diet and commitment to daily physical activity for a minimum of 30 minutes discussed and encouraged, as a part of hypertension management. The importance of attaining a healthy weight is also discussed.  BP/Weight 08/30/2019 06/22/2019 05/09/2019 01/05/2019 12/21/2018 12/06/2018 05/24/1831  Systolic BP 582 518 984 210 312 811 886  Diastolic BP 79 84 71 71 80 78 77  Wt. (Lbs) 129 128 129 - 129 133 132.8  BMI 23.59 22.67 23.59 - 23.59 24.33 24.29

## 2019-09-02 NOTE — Assessment & Plan Note (Signed)
Hyperlipidemia:Low fat diet discussed and encouraged.   Lipid Panel  Lab Results  Component Value Date   CHOL 177 08/25/2019   HDL 48 (L) 08/25/2019   LDLCALC 105 (H) 08/25/2019   TRIG 137 08/25/2019   CHOLHDL 3.7 08/25/2019  needs to reduce fried and fatty foods, not at goal

## 2019-09-02 NOTE — Assessment & Plan Note (Signed)
Managed by psych, controlled and stable

## 2019-09-19 ENCOUNTER — Telehealth: Payer: Self-pay | Admitting: Family Medicine

## 2019-09-19 NOTE — Telephone Encounter (Signed)
RECEIVED [facility] REVIEW  SLEEVED PUT IN DR SIMPSONS BOX

## 2019-09-26 ENCOUNTER — Other Ambulatory Visit: Payer: Self-pay | Admitting: Family Medicine

## 2019-09-26 DIAGNOSIS — K219 Gastro-esophageal reflux disease without esophagitis: Secondary | ICD-10-CM

## 2019-10-11 LAB — LIPID PANEL
Chol/HDL Ratio: 3.8 ratio (ref 0.0–4.4)
Cholesterol, Total: 161 mg/dL (ref 100–199)
HDL: 42 mg/dL (ref >39)
LDL Chol Calc (NIH): 97 mg/dL (ref 0–99)
Triglycerides: 119 mg/dL (ref 0–149)
VLDL Cholesterol Cal: 22 mg/dL (ref 5–40)

## 2019-10-11 LAB — CMP14+EGFR
ALT: 26 IU/L (ref 0–32)
AST: 24 IU/L (ref 0–40)
Albumin/Globulin Ratio: 1.3 (ref 1.2–2.2)
Albumin: 4.3 g/dL (ref 3.8–4.8)
Alkaline Phosphatase: 63 IU/L (ref 44–121)
BUN/Creatinine Ratio: 10 — ABNORMAL LOW (ref 12–28)
BUN: 8 mg/dL (ref 8–27)
Bilirubin Total: <0.2 mg/dL (ref 0.0–1.2)
CO2: 25 mmol/L (ref 20–29)
Calcium: 10.2 mg/dL (ref 8.7–10.3)
Chloride: 104 mmol/L (ref 96–106)
Creatinine, Ser: 0.84 mg/dL (ref 0.57–1.00)
GFR calc Af Amer: 86 mL/min/{1.73_m2} (ref >59)
GFR calc non Af Amer: 75 mL/min/{1.73_m2} (ref >59)
Globulin, Total: 3.3 g/dL (ref 1.5–4.5)
Glucose: 119 mg/dL — ABNORMAL HIGH (ref 65–99)
Potassium: 4.8 mmol/L (ref 3.5–5.2)
Sodium: 142 mmol/L (ref 134–144)
Total Protein: 7.6 g/dL (ref 6.0–8.5)

## 2019-10-11 LAB — HEMOGLOBIN A1C
Est. average glucose Bld gHb Est-mCnc: 186 mg/dL
Hgb A1c MFr Bld: 8.1 % — ABNORMAL HIGH (ref 4.8–5.6)

## 2019-10-16 ENCOUNTER — Other Ambulatory Visit (HOSPITAL_COMMUNITY): Payer: Self-pay | Admitting: Family Medicine

## 2019-10-18 ENCOUNTER — Ambulatory Visit: Payer: Medicaid Other | Admitting: Family Medicine

## 2019-10-18 ENCOUNTER — Other Ambulatory Visit: Payer: Self-pay | Admitting: Family Medicine

## 2019-10-20 ENCOUNTER — Other Ambulatory Visit (HOSPITAL_COMMUNITY): Payer: Self-pay | Admitting: Family Medicine

## 2019-10-20 DIAGNOSIS — Z1231 Encounter for screening mammogram for malignant neoplasm of breast: Secondary | ICD-10-CM

## 2019-10-25 ENCOUNTER — Other Ambulatory Visit: Payer: Self-pay | Admitting: Family Medicine

## 2019-11-01 ENCOUNTER — Other Ambulatory Visit: Payer: Self-pay | Admitting: Family Medicine

## 2019-11-17 ENCOUNTER — Other Ambulatory Visit: Payer: Self-pay

## 2019-11-17 ENCOUNTER — Ambulatory Visit (HOSPITAL_COMMUNITY)
Admission: RE | Admit: 2019-11-17 | Discharge: 2019-11-17 | Disposition: A | Discharge status: Home / Self Care | Payer: Medicaid Other | Source: Ambulatory Visit | Attending: Family Medicine | Admitting: Family Medicine

## 2019-11-17 DIAGNOSIS — Z1231 Encounter for screening mammogram for malignant neoplasm of breast: Secondary | ICD-10-CM

## 2019-11-24 ENCOUNTER — Other Ambulatory Visit: Payer: Self-pay | Admitting: Family Medicine

## 2019-11-24 DIAGNOSIS — K219 Gastro-esophageal reflux disease without esophagitis: Secondary | ICD-10-CM

## 2019-12-05 ENCOUNTER — Other Ambulatory Visit: Payer: Self-pay

## 2019-12-05 MED ORDER — DOCUSATE SODIUM 100 MG PO CAPS: ORAL_CAPSULE | ORAL | 3 refills | Status: DC

## 2019-12-05 MED ORDER — CALCIUM CITRATE-VITAMIN D 315-250 MG-UNIT PO TABS: ORAL_TABLET | ORAL | 3 refills | Status: DC

## 2020-01-01 ENCOUNTER — Other Ambulatory Visit: Payer: Self-pay | Admitting: Family Medicine

## 2020-01-03 ENCOUNTER — Other Ambulatory Visit: Payer: Self-pay | Admitting: *Deleted

## 2020-01-03 DIAGNOSIS — L84 Corns and callosities: Secondary | ICD-10-CM

## 2020-01-03 DIAGNOSIS — E785 Hyperlipidemia, unspecified: Secondary | ICD-10-CM

## 2020-01-03 DIAGNOSIS — I10 Essential (primary) hypertension: Secondary | ICD-10-CM

## 2020-01-04 LAB — LIPID PANEL
Chol/HDL Ratio: 3.5 ratio (ref 0.0–4.4)
Cholesterol, Total: 152 mg/dL (ref 100–199)
HDL: 44 mg/dL (ref >39)
LDL Chol Calc (NIH): 84 mg/dL (ref 0–99)
Triglycerides: 137 mg/dL (ref 0–149)
VLDL Cholesterol Cal: 24 mg/dL (ref 5–40)

## 2020-01-04 LAB — CMP14+EGFR
ALT: 29 IU/L (ref 0–32)
AST: 24 IU/L (ref 0–40)
Albumin/Globulin Ratio: 1.4 (ref 1.2–2.2)
Albumin: 4 g/dL (ref 3.8–4.8)
Alkaline Phosphatase: 66 IU/L (ref 44–121)
BUN/Creatinine Ratio: 8 — ABNORMAL LOW (ref 12–28)
BUN: 6 mg/dL — ABNORMAL LOW (ref 8–27)
Bilirubin Total: 0.2 mg/dL (ref 0.0–1.2)
CO2: 21 mmol/L (ref 20–29)
Calcium: 10.9 mg/dL — ABNORMAL HIGH (ref 8.7–10.3)
Chloride: 101 mmol/L (ref 96–106)
Creatinine, Ser: 0.76 mg/dL (ref 0.57–1.00)
GFR calc Af Amer: 97 mL/min/{1.73_m2} (ref >59)
GFR calc non Af Amer: 84 mL/min/{1.73_m2} (ref >59)
Globulin, Total: 2.9 g/dL (ref 1.5–4.5)
Glucose: 127 mg/dL — ABNORMAL HIGH (ref 65–99)
Potassium: 4.6 mmol/L (ref 3.5–5.2)
Sodium: 141 mmol/L (ref 134–144)
Total Protein: 6.9 g/dL (ref 6.0–8.5)

## 2020-01-04 LAB — HEMOGLOBIN A1C
Est. average glucose Bld gHb Est-mCnc: 157 mg/dL
Hgb A1c MFr Bld: 7.1 % — ABNORMAL HIGH (ref 4.8–5.6)

## 2020-01-09 ENCOUNTER — Encounter: Payer: Medicaid Other | Admitting: Family Medicine

## 2020-01-18 ENCOUNTER — Other Ambulatory Visit: Payer: Self-pay | Admitting: Family Medicine

## 2020-01-24 ENCOUNTER — Ambulatory Visit (INDEPENDENT_AMBULATORY_CARE_PROVIDER_SITE_OTHER): Payer: Medicaid Other | Admitting: Podiatry

## 2020-01-24 ENCOUNTER — Encounter: Payer: Self-pay | Admitting: Family Medicine

## 2020-01-24 ENCOUNTER — Encounter: Payer: Self-pay | Admitting: Podiatry

## 2020-01-24 ENCOUNTER — Other Ambulatory Visit (HOSPITAL_COMMUNITY)
Admission: RE | Admit: 2020-01-24 | Discharge: 2020-01-24 | Disposition: A | Discharge status: Home / Self Care | Payer: Medicaid Other | Source: Ambulatory Visit | Attending: Family Medicine | Admitting: Family Medicine

## 2020-01-24 ENCOUNTER — Other Ambulatory Visit: Payer: Self-pay

## 2020-01-24 ENCOUNTER — Ambulatory Visit (INDEPENDENT_AMBULATORY_CARE_PROVIDER_SITE_OTHER): Payer: Medicaid Other | Admitting: Family Medicine

## 2020-01-24 VITALS — BP 125/77 | HR 98 | Resp 15 | Ht 62.0 in | Wt 124.0 lb

## 2020-01-24 DIAGNOSIS — N760 Acute vaginitis: Secondary | ICD-10-CM | POA: Insufficient documentation

## 2020-01-24 DIAGNOSIS — E11628 Type 2 diabetes mellitus with other skin complications: Secondary | ICD-10-CM | POA: Diagnosis not present

## 2020-01-24 DIAGNOSIS — E1159 Type 2 diabetes mellitus with other circulatory complications: Secondary | ICD-10-CM | POA: Diagnosis not present

## 2020-01-24 DIAGNOSIS — B351 Tinea unguium: Secondary | ICD-10-CM | POA: Diagnosis not present

## 2020-01-24 DIAGNOSIS — Z0000 Encounter for general adult medical examination without abnormal findings: Secondary | ICD-10-CM

## 2020-01-24 DIAGNOSIS — M79675 Pain in left toe(s): Secondary | ICD-10-CM | POA: Diagnosis not present

## 2020-01-24 DIAGNOSIS — Z23 Encounter for immunization: Secondary | ICD-10-CM | POA: Diagnosis not present

## 2020-01-24 DIAGNOSIS — M79674 Pain in right toe(s): Secondary | ICD-10-CM

## 2020-01-24 DIAGNOSIS — L84 Corns and callosities: Secondary | ICD-10-CM

## 2020-01-24 NOTE — Progress Notes (Signed)
Wendy Oneill     MRN: [id number]      DOB: [birth year]  HPI: Patient is in for annual physical exam. . Immunization is reviewed , and  updated  C/o white vaginal d/c x 3 days, wants this checked   PE: BP 125/77   Pulse 98   Resp 15   Ht 5\' 2"  (1.575 m)   Wt 124 lb (56.2 kg)   LMP 07/06/2012   SpO2 96%   BMI 22.68 kg/m   Pleasant  female, alert and oriented x 3, in no cardio-pulmonary distress. Afebrile. HEENT No facial trauma or asymetry. Sinuses non tender.  Extra occullar muscles intact.. External ears normal, . Neck: supple, no adenopathy,JVD or thyromegaly.No bruits.  Chest: Clear to ascultation bilaterally.No crackles or wheezes. Non tender to palpation  Breast: No asymetry,no masses or lumps. No tenderness. No nipple discharge or inversion. No axillary or supraclavicular adenopathy  Cardiovascular system; Heart sounds normal,  S1 and  S2 ,no S3.  No murmur, or thrill. Apical beat not displaced Peripheral pulses normal.  Abdomen: Soft, non tender, no organomegaly or masses. No bruits. Bowel sounds normal. No guarding, tenderness or rebound.   GU: External genitalia normal female genitalia , normal female distribution of hair. No lesions. Urethral meatus normal in size, no  Prolapse, no lesions visibly  Present. Bladder non tender. Vagina pink and moist , with no visible lesions , discharge present . Adequate pelvic support no  cystocele or rectocele noted Cervix pink and appears healthy, no lesions or ulcerations noted, no discharge noted from os Uterus normal size, no adnexal masses, no cervical motion or adnexal tenderness.   Musculoskeletal exam: Full ROM of spine, hips , shoulders and knees. No deformity ,swelling or crepitus noted. No muscle wasting or atrophy.   Neurologic: Cranial nerves 2 to 12 intact. Power, tone ,sensation and reflexes normal throughout. No disturbance in gait. No tremor.  Skin: Intact, no ulceration, erythema ,  scaling or rash noted. Pigmentation normal throughout  Psych; Normal mood and affect. Judgement and concentration normal   Assessment & Plan:  Annual physical exam Annual exam as documented. Counseling done  re healthy lifestyle involving commitment to 150 minutes exercise per week, heart healthy diet, and attaining healthy weight.The importance of adequate sleep also discussed. Regular seat belt use and home safety, is also discussed. Changes in health habits are decided on by the patient with goals and time frames  set for achieving them. Immunization and cancer screening needs are specifically addressed at this visit.   Type 2 diabetes mellitus with pressure callus (HCC) Wendy Oneill is reminded of the importance of commitment to daily physical activity for 30 minutes or more, as able and the need to limit carbohydrate intake to 30 to 60 grams per meal to help with blood sugar control.   The need to take medication as prescribed, test blood sugar as directed, and to call between visits if there is a concern that blood sugar is uncontrolled is also discussed.   Wendy Oneill is reminded of the importance of daily foot exam, annual eye examination, and good blood sugar, blood pressure and cholesterol control.  Diabetic Labs Latest Ref Rng & Units 01/03/2020 10/10/2019 08/25/2019 04/25/2019 12/21/2018  HbA1c 4.8 - 5.6 % 7.1(H) 8.1(H) 8.3(H) 7.5(H) 7.8(H)  Microalbumin Not Estab. ug/mL - - - - <3.0(H)  Micro/Creat Ratio 0 - 29 mg/g creat - - - - <9  Chol 100 - 199 mg/dL 665 993 570 177 -  HDL >39 mg/dL 44 42 09(N) 44 -  Calc LDL 0 - 99 mg/dL 84 97 235(T) 88 -  Triglycerides 0 - 149 mg/dL 732 202 542 706 -  Creatinine 0.57 - 1.00 mg/dL 2.37 6.28 3.15 1.76 1.60   BP/Weight 01/24/2020 08/30/2019 06/22/2019 05/09/2019 01/05/2019 12/21/2018 12/06/2018  Systolic BP 125 113 124 134 164 114 129  Diastolic BP 77 79 84 71 71 80 78  Wt. (Lbs) 124 129 128 129 - 129 133  BMI 22.68 23.59 22.67 23.59 - 23.59 24.33    Foot/eye exam completion dates Latest Ref Rng & Units 01/24/2020 12/21/2018  Eye Exam No Retinopathy - -  Foot exam Order - - -  Foot Form Completion - Done Done        Vaginitis and vulvovaginitis Symptomatic x 3 days, specimens sent for testing

## 2020-01-24 NOTE — Assessment & Plan Note (Signed)
Symptomatic x 3 days, specimens sent for testing

## 2020-01-24 NOTE — Assessment & Plan Note (Signed)
Wendy Oneill is reminded of the importance of commitment to daily physical activity for 30 minutes or more, as able and the need to limit carbohydrate intake to 30 to 60 grams per meal to help with blood sugar control.   The need to take medication as prescribed, test blood sugar as directed, and to call between visits if there is a concern that blood sugar is uncontrolled is also discussed.   Wendy Oneill is reminded of the importance of daily foot exam, annual eye examination, and good blood sugar, blood pressure and cholesterol control.  Diabetic Labs Latest Ref Rng & Units 01/03/2020 10/10/2019 08/25/2019 04/25/2019 12/21/2018  HbA1c 4.8 - 5.6 % 7.1(H) 8.1(H) 8.3(H) 7.5(H) 7.8(H)  Microalbumin Not Estab. ug/mL - - - - <3.0(H)  Micro/Creat Ratio 0 - 29 mg/g creat - - - - <9  Chol 100 - 199 mg/dL 712 197 588 325 -  HDL >39 mg/dL 44 42 49(I) 44 -  Calc LDL 0 - 99 mg/dL 84 97 264(B) 88 -  Triglycerides 0 - 149 mg/dL 583 094 076 808 -  Creatinine 0.57 - 1.00 mg/dL 8.11 0.31 5.94 5.85 9.29   BP/Weight 01/24/2020 08/30/2019 06/22/2019 05/09/2019 01/05/2019 12/21/2018 12/06/2018  Systolic BP 125 113 124 134 164 114 129  Diastolic BP 77 79 84 71 71 80 78  Wt. (Lbs) 124 129 128 129 - 129 133  BMI 22.68 23.59 22.67 23.59 - 23.59 24.33   Foot/eye exam completion dates Latest Ref Rng & Units 01/24/2020 12/21/2018  Eye Exam No Retinopathy - -  Foot exam Order - - -  Foot Form Completion - Done Done

## 2020-01-24 NOTE — Assessment & Plan Note (Signed)

## 2020-01-24 NOTE — Progress Notes (Signed)
Complaint:  Visit Type: Patient returns to my office for continued preventative foot care services. Complaint: Patient states" my nails have grown long and thick and become painful to walk and wear shoes" Patient has been diagnosed with DM with no foot complications. The patient presents for preventative foot care services.  Podiatric Exam: Vascular: dorsalis pedis and posterior tibial pulses are palpable bilateral. Capillary return is immediate. Temperature gradient is WNL. Skin turgor WNL  Sensorium: Normal Semmes Weinstein monofilament test. Normal tactile sensation bilaterally. Nail Exam: Pt has thick disfigured discolored nails with subungual debris noted bilateral entire nail hallux through fifth toenails Ulcer Exam: There is no evidence of ulcer or pre-ulcerative changes or infection. Orthopedic Exam: Muscle tone and strength are WNL. No limitations in general ROM. No crepitus or effusions noted. Foot type and digits show no abnormalities. Bony prominences are unremarkable. Skin: No Porokeratosis. No infection or ulcers  Diagnosis:  Onychomycosis, , Pain in right toe, pain in left toes  Treatment & Plan Procedures and Treatment: Consent by patient was obtained for treatment procedures.   Debridement of mycotic and hypertrophic toenails, 1 through 5 bilateral and clearing of subungual debris. No ulceration, no infection noted.  Return Visit-Office Procedure: Patient instructed to return to the office for a follow up visit 3 months for continued evaluation and treatment.    Diala Waxman DPM 

## 2020-01-24 NOTE — Patient Instructions (Addendum)
F/u in 5 months, call if you need me sooner  Flu vaccine today  We will send for diabetic eye exam done in Sept, 2021, [facility] , [place]  Specimen sent for wet pep and culture microalb as soon as possible  Non fast labs for next visit , we will mail that to you  [place] labs and exam, keep up the good work!  Thanks for choosing [facility], we consider it a privelige to serve you.

## 2020-01-25 LAB — CERVICOVAGINAL ANCILLARY ONLY
Bacterial Vaginitis (gardnerella): POSITIVE — AB
Candida Glabrata: POSITIVE — AB
Candida Vaginitis: NEGATIVE
Chlamydia: NEGATIVE
Comment: NEGATIVE
Comment: NEGATIVE
Comment: NEGATIVE
Comment: NEGATIVE
Comment: NEGATIVE
Comment: NORMAL
Neisseria Gonorrhea: NEGATIVE
Trichomonas: NEGATIVE

## 2020-01-27 ENCOUNTER — Other Ambulatory Visit: Payer: Self-pay | Admitting: Family Medicine

## 2020-01-27 MED ORDER — FLUCONAZOLE 150 MG PO TABS: ORAL_TABLET | ORAL | 0 refills | Status: DC

## 2020-01-27 MED ORDER — METRONIDAZOLE 500 MG PO TABS: 500.0000 mg | ORAL_TABLET | Freq: Two times a day (BID) | ORAL | 0 refills | Status: DC

## 2020-02-01 ENCOUNTER — Other Ambulatory Visit: Payer: Self-pay

## 2020-02-14 ENCOUNTER — Other Ambulatory Visit: Payer: Self-pay | Admitting: Family Medicine

## 2020-02-21 ENCOUNTER — Other Ambulatory Visit: Payer: Self-pay | Admitting: Family Medicine

## 2020-02-21 DIAGNOSIS — K219 Gastro-esophageal reflux disease without esophagitis: Secondary | ICD-10-CM

## 2020-03-15 ENCOUNTER — Other Ambulatory Visit: Payer: Self-pay | Admitting: Family Medicine

## 2020-03-22 ENCOUNTER — Other Ambulatory Visit: Payer: Self-pay | Admitting: Family Medicine

## 2020-03-22 DIAGNOSIS — K219 Gastro-esophageal reflux disease without esophagitis: Secondary | ICD-10-CM

## 2020-04-23 ENCOUNTER — Other Ambulatory Visit: Payer: Self-pay | Admitting: Family Medicine

## 2020-04-24 ENCOUNTER — Encounter: Payer: Self-pay | Admitting: Podiatry

## 2020-04-24 ENCOUNTER — Other Ambulatory Visit: Payer: Self-pay

## 2020-04-24 ENCOUNTER — Ambulatory Visit (INDEPENDENT_AMBULATORY_CARE_PROVIDER_SITE_OTHER): Payer: Medicaid Other | Admitting: Podiatry

## 2020-04-24 ENCOUNTER — Ambulatory Visit: Payer: Medicaid Other | Admitting: Podiatry

## 2020-04-24 DIAGNOSIS — B351 Tinea unguium: Secondary | ICD-10-CM | POA: Diagnosis not present

## 2020-04-24 DIAGNOSIS — M79674 Pain in right toe(s): Secondary | ICD-10-CM | POA: Diagnosis not present

## 2020-04-24 DIAGNOSIS — M79675 Pain in left toe(s): Secondary | ICD-10-CM | POA: Diagnosis not present

## 2020-04-24 DIAGNOSIS — E1159 Type 2 diabetes mellitus with other circulatory complications: Secondary | ICD-10-CM | POA: Diagnosis not present

## 2020-04-24 NOTE — Progress Notes (Signed)
Complaint:  Visit Type: Patient returns to my office for continued preventative foot care services. Complaint: Patient states" my nails have grown long and thick and become painful to walk and wear shoes" Patient has been diagnosed with DM with no foot complications. The patient presents for preventative foot care services.  Podiatric Exam: Vascular: dorsalis pedis and posterior tibial pulses are palpable bilateral. Capillary return is immediate. Temperature gradient is WNL. Skin turgor WNL  Sensorium: Normal Semmes Weinstein monofilament test. Normal tactile sensation bilaterally. Nail Exam: Pt has thick disfigured discolored nails with subungual debris noted bilateral entire nail hallux through fifth toenails Ulcer Exam: There is no evidence of ulcer or pre-ulcerative changes or infection. Orthopedic Exam: Muscle tone and strength are WNL. No limitations in general ROM. No crepitus or effusions noted. Foot type and digits show no abnormalities. Bony prominences are unremarkable. Skin: No Porokeratosis. No infection or ulcers  Diagnosis:  Onychomycosis, , Pain in right toe, pain in left toes  Treatment & Plan Procedures and Treatment: Consent by patient was obtained for treatment procedures.   Debridement of mycotic and hypertrophic toenails, 1 through 5 bilateral and clearing of subungual debris. No ulceration, no infection noted.  Patient has [employer] and does not qualify for diabetic shoes. Return Visit-Office Procedure: Patient instructed to return to the office for a follow up visit 3 months for continued evaluation and treatment.    Gardiner Barefoot DPM

## 2020-05-08 ENCOUNTER — Other Ambulatory Visit: Payer: Self-pay | Admitting: Family Medicine

## 2020-05-24 ENCOUNTER — Other Ambulatory Visit: Payer: Self-pay | Admitting: Family Medicine

## 2020-06-08 ENCOUNTER — Encounter: Payer: Self-pay | Admitting: Podiatry

## 2020-06-08 NOTE — Progress Notes (Signed)
Appointment canceled.

## 2020-06-12 ENCOUNTER — Other Ambulatory Visit: Payer: Self-pay | Admitting: Family Medicine

## 2020-06-25 ENCOUNTER — Other Ambulatory Visit: Payer: Self-pay | Admitting: Family Medicine

## 2020-06-25 DIAGNOSIS — K219 Gastro-esophageal reflux disease without esophagitis: Secondary | ICD-10-CM

## 2020-07-30 ENCOUNTER — Encounter: Payer: Self-pay | Admitting: Podiatry

## 2020-07-30 ENCOUNTER — Ambulatory Visit (INDEPENDENT_AMBULATORY_CARE_PROVIDER_SITE_OTHER): Payer: Medicaid Other | Admitting: Podiatry

## 2020-07-30 ENCOUNTER — Other Ambulatory Visit: Payer: Self-pay

## 2020-07-30 DIAGNOSIS — B351 Tinea unguium: Secondary | ICD-10-CM

## 2020-07-30 DIAGNOSIS — E1159 Type 2 diabetes mellitus with other circulatory complications: Secondary | ICD-10-CM | POA: Diagnosis not present

## 2020-07-30 DIAGNOSIS — M79675 Pain in left toe(s): Secondary | ICD-10-CM | POA: Diagnosis not present

## 2020-07-30 DIAGNOSIS — M79674 Pain in right toe(s): Secondary | ICD-10-CM | POA: Diagnosis not present

## 2020-07-30 NOTE — Progress Notes (Signed)
Complaint:  Visit Type: Patient returns to my office for continued preventative foot care services. Complaint: Patient states" my nails have grown long and thick and become painful to walk and wear shoes" Patient has been diagnosed with DM with no foot complications. The patient presents for preventative foot care services.  Podiatric Exam: Vascular: dorsalis pedis and posterior tibial pulses are palpable bilateral. Capillary return is immediate. Temperature gradient is WNL. Skin turgor WNL  Sensorium: Normal Semmes Weinstein monofilament test. Normal tactile sensation bilaterally. Nail Exam: Pt has thick disfigured discolored nails with subungual debris noted bilateral entire nail hallux through fifth toenails Ulcer Exam: There is no evidence of ulcer or pre-ulcerative changes or infection. Orthopedic Exam: Muscle tone and strength are WNL. No limitations in general ROM. No crepitus or effusions noted. Foot type and digits show no abnormalities. Bony prominences are unremarkable. Skin: No Porokeratosis. No infection or ulcers  Diagnosis:  Onychomycosis, , Pain in right toe, pain in left toes  Treatment & Plan Procedures and Treatment: Consent by patient was obtained for treatment procedures.   Debridement of mycotic and hypertrophic toenails, 1 through 5 bilateral and clearing of subungual debris. No ulceration, no infection noted.  Patient has [employer] and does not qualify for diabetic shoes. Return Visit-Office Procedure: Patient instructed to return to the office for a follow up visit 3 months for continued evaluation and treatment.    Gardiner Barefoot DPM

## 2020-08-16 ENCOUNTER — Other Ambulatory Visit: Payer: Self-pay | Admitting: Family Medicine

## 2020-09-06 ENCOUNTER — Telehealth: Payer: Medicaid Other

## 2020-09-06 NOTE — Telephone Encounter (Signed)
Butch Penny from [facility] called needs prior authorization on medicine Janumet donna call back # [phone]

## 2020-09-06 NOTE — Telephone Encounter (Signed)
Patient need med refill. Needs prior authorizataion.  Currently out of medicine.  JANUMET XR 50-1000 MG TB24   Pharmacy:  [facility]

## 2020-09-06 NOTE — Telephone Encounter (Signed)
Aware that PA was submitted

## 2020-09-12 ENCOUNTER — Telehealth: Payer: Self-pay | Admitting: Family Medicine

## 2020-09-12 NOTE — Telephone Encounter (Signed)
Needs OV , has not  been in since Jan , Wendy Oneill asking for lst 2 labs to be faxed see sheet. Needs cBC, tSH, vit d , microalb fasting lipid, cmp and eGFr and hBA1C in the next week and OV after the lab

## 2020-09-13 ENCOUNTER — Other Ambulatory Visit: Payer: Self-pay

## 2020-09-13 DIAGNOSIS — E11628 Type 2 diabetes mellitus with other skin complications: Secondary | ICD-10-CM

## 2020-09-13 DIAGNOSIS — I10 Essential (primary) hypertension: Secondary | ICD-10-CM

## 2020-09-13 DIAGNOSIS — E785 Hyperlipidemia, unspecified: Secondary | ICD-10-CM

## 2020-09-13 NOTE — Telephone Encounter (Signed)
[place] aware and labs ordered

## 2020-09-24 ENCOUNTER — Other Ambulatory Visit: Payer: Self-pay | Admitting: Family Medicine

## 2020-09-24 DIAGNOSIS — K219 Gastro-esophageal reflux disease without esophagitis: Secondary | ICD-10-CM

## 2020-10-11 ENCOUNTER — Other Ambulatory Visit: Payer: Self-pay | Admitting: Family Medicine

## 2020-10-16 ENCOUNTER — Other Ambulatory Visit (HOSPITAL_COMMUNITY): Payer: Self-pay | Admitting: Family Medicine

## 2020-10-16 DIAGNOSIS — Z1231 Encounter for screening mammogram for malignant neoplasm of breast: Secondary | ICD-10-CM

## 2020-11-05 ENCOUNTER — Other Ambulatory Visit: Payer: Self-pay

## 2020-11-05 ENCOUNTER — Ambulatory Visit (INDEPENDENT_AMBULATORY_CARE_PROVIDER_SITE_OTHER): Payer: Medicaid Other | Admitting: Podiatry

## 2020-11-05 ENCOUNTER — Encounter: Payer: Self-pay | Admitting: Podiatry

## 2020-11-05 DIAGNOSIS — B351 Tinea unguium: Secondary | ICD-10-CM | POA: Diagnosis not present

## 2020-11-05 DIAGNOSIS — E1159 Type 2 diabetes mellitus with other circulatory complications: Secondary | ICD-10-CM | POA: Diagnosis not present

## 2020-11-05 DIAGNOSIS — M79675 Pain in left toe(s): Secondary | ICD-10-CM | POA: Diagnosis not present

## 2020-11-05 DIAGNOSIS — M79674 Pain in right toe(s): Secondary | ICD-10-CM

## 2020-11-05 NOTE — Progress Notes (Signed)
Complaint:  Visit Type: Patient returns to my office for continued preventative foot care services. Complaint: Patient states" my nails have grown long and thick and become painful to walk and wear shoes" Patient has been diagnosed with DM with no foot complications. The patient presents for preventative foot care services.  Podiatric Exam: Vascular: dorsalis pedis and posterior tibial pulses are palpable bilateral. Capillary return is immediate. Temperature gradient is WNL. Skin turgor WNL  Sensorium: Normal Semmes Weinstein monofilament test. Normal tactile sensation bilaterally. Nail Exam: Pt has thick disfigured discolored nails with subungual debris noted bilateral entire nail hallux through fifth toenails.  Hemorrhagic discoloration medial border right hallux.  Pain-free. Ulcer Exam: There is no evidence of ulcer or pre-ulcerative changes or infection. Orthopedic Exam: Muscle tone and strength are WNL. No limitations in general ROM. No crepitus or effusions noted. Foot type and digits show no abnormalities. Bony prominences are unremarkable. Skin: No Porokeratosis. No infection or ulcers  Diagnosis:  Onychomycosis, , Pain in right toe, pain in left toes  Treatment & Plan Procedures and Treatment: Consent by patient was obtained for treatment procedures.   Debridement of mycotic and hypertrophic toenails, 1 through 5 bilateral and clearing of subungual debris. No ulceration, no infection noted.   Return Visit-Office Procedure: Patient instructed to return to the office for a follow up visit 3 months for continued evaluation and treatment.    Gardiner Barefoot DPM

## 2020-11-12 ENCOUNTER — Other Ambulatory Visit: Payer: Self-pay | Admitting: Family Medicine

## 2020-11-18 ENCOUNTER — Other Ambulatory Visit: Payer: Self-pay | Admitting: Family Medicine

## 2020-11-18 ENCOUNTER — Other Ambulatory Visit: Payer: Self-pay

## 2020-11-18 ENCOUNTER — Ambulatory Visit (HOSPITAL_COMMUNITY)
Admission: RE | Admit: 2020-11-18 | Discharge: 2020-11-18 | Disposition: A | Discharge status: Home / Self Care | Payer: Medicaid Other | Source: Ambulatory Visit | Attending: Family Medicine | Admitting: Family Medicine

## 2020-11-18 DIAGNOSIS — Z1231 Encounter for screening mammogram for malignant neoplasm of breast: Secondary | ICD-10-CM | POA: Insufficient documentation

## 2020-11-18 DIAGNOSIS — K219 Gastro-esophageal reflux disease without esophagitis: Secondary | ICD-10-CM

## 2020-11-21 NOTE — Progress Notes (Signed)
Left message for Rise Paganini to give office a call back.

## 2020-11-24 LAB — CMP14+EGFR
ALT: 16 IU/L (ref 0–32)
AST: 20 IU/L (ref 0–40)
Albumin/Globulin Ratio: 1.5 (ref 1.2–2.2)
Albumin: 4.4 g/dL (ref 3.8–4.8)
Alkaline Phosphatase: 80 IU/L (ref 44–121)
BUN/Creatinine Ratio: 7 — ABNORMAL LOW (ref 12–28)
BUN: 6 mg/dL — ABNORMAL LOW (ref 8–27)
Bilirubin Total: <0.2 mg/dL (ref 0.0–1.2)
CO2: 26 mmol/L (ref 20–29)
Calcium: 11.1 mg/dL — ABNORMAL HIGH (ref 8.7–10.3)
Chloride: 103 mmol/L (ref 96–106)
Creatinine, Ser: 0.81 mg/dL (ref 0.57–1.00)
Globulin, Total: 2.9 g/dL (ref 1.5–4.5)
Glucose: 85 mg/dL (ref 70–99)
Potassium: 4.9 mmol/L (ref 3.5–5.2)
Sodium: 142 mmol/L (ref 134–144)
Total Protein: 7.3 g/dL (ref 6.0–8.5)
eGFR: 82 mL/min/{1.73_m2} (ref >59)

## 2020-11-24 LAB — LIPID PANEL
Chol/HDL Ratio: 3.1 ratio (ref 0.0–4.4)
Cholesterol, Total: 143 mg/dL (ref 100–199)
HDL: 46 mg/dL (ref >39)
LDL Chol Calc (NIH): 77 mg/dL (ref 0–99)
Triglycerides: 109 mg/dL (ref 0–149)
VLDL Cholesterol Cal: 20 mg/dL (ref 5–40)

## 2020-11-24 LAB — MICROALBUMIN / CREATININE URINE RATIO
Creatinine, Urine: 42.5 mg/dL
Microalb/Creat Ratio: <7 mg/g creat (ref 0–29)
Microalbumin, Urine: <3 ug/mL

## 2020-11-24 LAB — CBC
Hematocrit: 39.6 % (ref 34.0–46.6)
Hemoglobin: 13.6 g/dL (ref 11.1–15.9)
MCH: 31.6 pg (ref 26.6–33.0)
MCHC: 34.3 g/dL (ref 31.5–35.7)
MCV: 92 fL (ref 79–97)
Platelets: 278 10*3/uL (ref 150–450)
RBC: 4.31 x10E6/uL (ref 3.77–5.28)
RDW: 11.9 % (ref 11.7–15.4)
WBC: 4.2 10*3/uL (ref 3.4–10.8)

## 2020-11-24 LAB — VITAMIN D 25 HYDROXY (VIT D DEFICIENCY, FRACTURES): Vit D, 25-Hydroxy: 41.4 ng/mL (ref 30.0–100.0)

## 2020-11-24 LAB — TSH: TSH: 0.847 u[IU]/mL (ref 0.450–4.500)

## 2020-11-24 LAB — HEMOGLOBIN A1C
Est. average glucose Bld gHb Est-mCnc: 143 mg/dL
Hgb A1c MFr Bld: 6.6 % — ABNORMAL HIGH (ref 4.8–5.6)

## 2020-12-05 LAB — HM DIABETES EYE EXAM

## 2020-12-20 ENCOUNTER — Other Ambulatory Visit: Payer: Self-pay | Admitting: Family Medicine

## 2021-01-01 ENCOUNTER — Ambulatory Visit: Payer: Medicaid Other | Admitting: Family Medicine

## 2021-01-08 ENCOUNTER — Other Ambulatory Visit: Payer: Self-pay | Admitting: Family Medicine

## 2021-01-21 ENCOUNTER — Other Ambulatory Visit: Payer: Self-pay | Admitting: Family Medicine

## 2021-01-21 DIAGNOSIS — K219 Gastro-esophageal reflux disease without esophagitis: Secondary | ICD-10-CM

## 2021-01-22 ENCOUNTER — Other Ambulatory Visit: Payer: Self-pay | Admitting: Family Medicine

## 2021-02-05 ENCOUNTER — Ambulatory Visit: Payer: Medicaid Other | Admitting: Podiatry

## 2021-02-19 ENCOUNTER — Ambulatory Visit (INDEPENDENT_AMBULATORY_CARE_PROVIDER_SITE_OTHER): Payer: Medicaid Other | Admitting: Podiatry

## 2021-02-19 ENCOUNTER — Encounter: Payer: Self-pay | Admitting: Podiatry

## 2021-02-19 ENCOUNTER — Other Ambulatory Visit: Payer: Self-pay

## 2021-02-19 DIAGNOSIS — M79675 Pain in left toe(s): Secondary | ICD-10-CM

## 2021-02-19 DIAGNOSIS — B351 Tinea unguium: Secondary | ICD-10-CM | POA: Diagnosis not present

## 2021-02-19 DIAGNOSIS — E1159 Type 2 diabetes mellitus with other circulatory complications: Secondary | ICD-10-CM | POA: Diagnosis not present

## 2021-02-19 DIAGNOSIS — M79674 Pain in right toe(s): Secondary | ICD-10-CM

## 2021-02-19 NOTE — Progress Notes (Signed)
Complaint:  Visit Type: Patient returns to my office for continued preventative foot care services. Complaint: Patient states" my nails have grown long and thick and become painful to walk and wear shoes" Patient has been diagnosed with DM with no foot complications. The patient presents for preventative foot care services.  Podiatric Exam: Vascular: dorsalis pedis and posterior tibial pulses are palpable bilateral. Capillary return is immediate. Temperature gradient is WNL. Skin turgor WNL  Sensorium: Normal Semmes Weinstein monofilament test. Normal tactile sensation bilaterally. Nail Exam: Pt has thick disfigured discolored nails with subungual debris noted bilateral entire nail hallux through fifth toenails.  Hemorrhagic discoloration medial border right hallux.  Pain-free. Ulcer Exam: There is no evidence of ulcer or pre-ulcerative changes or infection. Orthopedic Exam: Muscle tone and strength are WNL. No limitations in general ROM. No crepitus or effusions noted. Foot type and digits show no abnormalities. Bony prominences are unremarkable. Skin: No Porokeratosis. No infection or ulcers  Diagnosis:  Onychomycosis, , Pain in right toe, pain in left toes  Treatment & Plan Procedures and Treatment: Consent by patient was obtained for treatment procedures.   Debridement of mycotic and hypertrophic toenails, 1 through 5 bilateral and clearing of subungual debris. No ulceration, no infection noted.   Return Visit-Office Procedure: Patient instructed to return to the office for a follow up visit 3 months for continued evaluation and treatment.    Gardiner Barefoot DPM

## 2021-02-26 ENCOUNTER — Other Ambulatory Visit: Payer: Self-pay | Admitting: Family Medicine

## 2021-03-11 ENCOUNTER — Other Ambulatory Visit: Payer: Self-pay

## 2021-03-11 ENCOUNTER — Encounter: Payer: Self-pay | Admitting: Family Medicine

## 2021-03-11 ENCOUNTER — Ambulatory Visit (INDEPENDENT_AMBULATORY_CARE_PROVIDER_SITE_OTHER): Payer: Medicaid Other | Admitting: Family Medicine

## 2021-03-11 VITALS — BP 118/77 | HR 117 | Ht 62.0 in | Wt 112.0 lb

## 2021-03-11 DIAGNOSIS — E11628 Type 2 diabetes mellitus with other skin complications: Secondary | ICD-10-CM | POA: Diagnosis not present

## 2021-03-11 DIAGNOSIS — I10 Essential (primary) hypertension: Secondary | ICD-10-CM | POA: Diagnosis not present

## 2021-03-11 DIAGNOSIS — F251 Schizoaffective disorder, depressive type: Secondary | ICD-10-CM | POA: Diagnosis not present

## 2021-03-11 DIAGNOSIS — E785 Hyperlipidemia, unspecified: Secondary | ICD-10-CM

## 2021-03-11 DIAGNOSIS — L84 Corns and callosities: Secondary | ICD-10-CM

## 2021-03-11 DIAGNOSIS — J3089 Other allergic rhinitis: Secondary | ICD-10-CM

## 2021-03-11 DIAGNOSIS — K219 Gastro-esophageal reflux disease without esophagitis: Secondary | ICD-10-CM

## 2021-03-11 LAB — POCT GLYCOSYLATED HEMOGLOBIN (HGB A1C): HbA1c, POC (controlled diabetic range): 6.5 % (ref 0.0–7.0)

## 2021-03-11 NOTE — Patient Instructions (Addendum)
Annual exam in September with pap, call if you need me sooner, Flu vaccine at visit  GlycohB in office today  You need shingrix vaccines, please get these at your pharmacy   Medications are reviewed and are as listed  Please get fasting lipid, cmp and EGFr, hBA1c , cBC, lipid and TSH 1 week before September appointment  Thanks for choosing [facility], we consider it a privelige to serve you.

## 2021-03-11 NOTE — Progress Notes (Signed)
Wendy Oneill     MRN: [id number]      DOB: [birth year]   HPI Ms. Wendy Oneill is here for follow up and re-evaluation of chronic medical conditions, medication management and review of any available recent lab and radiology data.  Preventive health is updated, specifically  Cancer screening and Immunization.   . The PT denies any adverse reactions to current medications since the last visit.  There are no new concerns.  There are no specific complaints  Denies polyuria, polydipsia, blurred vision , or hypoglycemic episodes.   ROS Denies recent fever or chills. Denies sinus pressure, nasal congestion, ear pain or sore throat. Denies chest congestion, productive cough or wheezing. Denies chest pains, palpitations and leg swelling Denies abdominal pain, nausea, vomiting,diarrhea or constipation.   Denies dysuria, frequency, hesitancy or incontinence. Denies joint pain, swelling and limitation in mobility. Denies headaches, seizures, numbness, or tingling. Denies uncontrolled  depression, anxiety or insomnia. Denies skin break down or rash.   PE  BP 118/77 (BP Location: Right Arm, Patient Position: Sitting, Cuff Size: Normal)    Pulse (!) 117    Ht 5\' 2"  (1.575 m)    Wt 112 lb (50.8 kg)    LMP 07/06/2012    SpO2 97%    BMI 20.49 kg/m   Patient alert and oriented and in no cardiopulmonary distress.  HEENT: No facial asymmetry, EOMI,     Neck supple .  Chest: Clear to auscultation bilaterally.  CVS: S1, S2 no murmurs, no S3.Regular rate.  ABD: Soft non tender.   Ext: No edema  MS: Adequate ROM spine, shoulders, hips and knees.  Skin: Intact, no ulcerations or rash noted.  Psych: Good eye contact, normal affect. Memory intact not anxious or depressed appearing.  CNS: CN 2-12 intact, power,  normal throughout.no focal deficits noted.   Assessment & Plan  Essential hypertension Controlled, no change in medication DASH diet and commitment to daily physical activity for a  minimum of 30 minutes discussed and encouraged, as a part of hypertension management. The importance of attaining a healthy weight is also discussed.  BP/Weight 03/11/2021 01/24/2020 08/30/2019 06/22/2019 05/09/2019 01/05/2019 40/09/8117  Systolic BP 147 829 562 130 865 784 696  Diastolic BP 77 77 79 84 71 71 80  Wt. (Lbs) 112 124 129 128 129 - 129  BMI 20.49 22.68 23.59 22.67 23.59 - 23.59       Schizoaffective disorder, depressive type ([place]) Managed by Psych and controlled on med  Type 2 diabetes mellitus with pressure callus ([place]) Ms. Wendy Oneill is reminded of the importance of commitment to daily physical activity for 30 minutes or more, as able and the need to limit carbohydrate intake to 30 to 60 grams per meal to help with blood sugar control.   The need to take medication as prescribed, test blood sugar as directed, and to call between visits if there is a concern that blood sugar is uncontrolled is also discussed.   Ms. Wendy Oneill is reminded of the importance of daily foot exam, annual eye examination, and good blood sugar, blood pressure and cholesterol control. Controlled, no change in medication   Diabetic Labs Latest Ref Rng & Units 03/11/2021 11/22/2020 01/03/2020 10/10/2019 08/25/2019  HbA1c 0.0 - 7.0 % 6.5 6.6(H) 7.1(H) 8.1(H) 8.3(H)  Microalbumin Not Estab. ug/mL - - - - -  Micro/Creat Ratio 0 - 29 mg/g creat - <7 - - -  Chol 100 - 199 mg/dL - 143 152 161 177  HDL >39  mg/dL - 46 44 42 48(L)  Calc LDL 0 - 99 mg/dL - 77 84 97 105(H)  Triglycerides 0 - 149 mg/dL - 109 137 119 137  Creatinine 0.57 - 1.00 mg/dL - 0.81 0.76 0.84 0.83   BP/Weight 03/11/2021 01/24/2020 08/30/2019 06/22/2019 05/09/2019 01/05/2019 37/09/238  Systolic BP 973 532 992 426 834 196 222  Diastolic BP 77 77 79 84 71 71 80  Wt. (Lbs) 112 124 129 128 129 - 129  BMI 20.49 22.68 23.59 22.67 23.59 - 23.59   Foot/eye exam completion dates Latest Ref Rng & Units 12/05/2020 01/24/2020  Eye Exam No Retinopathy No Retinopathy -   Foot exam Order - - -  Foot Form Completion - - Done        Hyperlipidemia LDL goal <100 Hyperlipidemia:Low fat diet discussed and encouraged.   Lipid Panel  Lab Results  Component Value Date   CHOL 143 11/22/2020   HDL 46 11/22/2020   LDLCALC 77 11/22/2020   TRIG 109 11/22/2020   CHOLHDL 3.1 11/22/2020   Controlled, no change in medication     Gastro-esophageal reflux Controlled, no change in medication   Allergic rhinitis Controlled, no change in medication

## 2021-03-16 ENCOUNTER — Encounter: Payer: Self-pay | Admitting: Family Medicine

## 2021-03-16 NOTE — Assessment & Plan Note (Signed)
Controlled, no change in medication  

## 2021-03-16 NOTE — Assessment & Plan Note (Signed)
Managed by Psych and controlled on med

## 2021-03-16 NOTE — Assessment & Plan Note (Signed)
Controlled, no change in medication DASH diet and commitment to daily physical activity for a minimum of 30 minutes discussed and encouraged, as a part of hypertension management. The importance of attaining a healthy weight is also discussed.  BP/Weight 03/11/2021 01/24/2020 08/30/2019 06/22/2019 05/09/2019 01/05/2019 09/0/5025  Systolic BP 615 488 457 334 483 015 996  Diastolic BP 77 77 79 84 71 71 80  Wt. (Lbs) 112 124 129 128 129 - 129  BMI 20.49 22.68 23.59 22.67 23.59 - 23.59

## 2021-03-16 NOTE — Assessment & Plan Note (Signed)
Wendy Oneill is reminded of the importance of commitment to daily physical activity for 30 minutes or more, as able and the need to limit carbohydrate intake to 30 to 60 grams per meal to help with blood sugar control.   The need to take medication as prescribed, test blood sugar as directed, and to call between visits if there is a concern that blood sugar is uncontrolled is also discussed.   Wendy Oneill is reminded of the importance of daily foot exam, annual eye examination, and good blood sugar, blood pressure and cholesterol control. Controlled, no change in medication   Diabetic Labs Latest Ref Rng & Units 03/11/2021 11/22/2020 01/03/2020 10/10/2019 08/25/2019  HbA1c 0.0 - 7.0 % 6.5 6.6(H) 7.1(H) 8.1(H) 8.3(H)  Microalbumin Not Estab. ug/mL - - - - -  Micro/Creat Ratio 0 - 29 mg/g creat - <7 - - -  Chol 100 - 199 mg/dL - 143 152 161 177  HDL >39 mg/dL - 46 44 42 48(L)  Calc LDL 0 - 99 mg/dL - 77 84 97 105(H)  Triglycerides 0 - 149 mg/dL - 109 137 119 137  Creatinine 0.57 - 1.00 mg/dL - 0.81 0.76 0.84 0.83   BP/Weight 03/11/2021 01/24/2020 08/30/2019 06/22/2019 05/09/2019 01/05/2019 64/03/8379  Systolic BP 840 375 436 067 703 403 524  Diastolic BP 77 77 79 84 71 71 80  Wt. (Lbs) 112 124 129 128 129 - 129  BMI 20.49 22.68 23.59 22.67 23.59 - 23.59   Foot/eye exam completion dates Latest Ref Rng & Units 12/05/2020 01/24/2020  Eye Exam No Retinopathy No Retinopathy -  Foot exam Order - - -  Foot Form Completion - - Done

## 2021-03-16 NOTE — Assessment & Plan Note (Signed)
Hyperlipidemia:Low fat diet discussed and encouraged.   Lipid Panel  Lab Results  Component Value Date   CHOL 143 11/22/2020   HDL 46 11/22/2020   LDLCALC 77 11/22/2020   TRIG 109 11/22/2020   CHOLHDL 3.1 11/22/2020   Controlled, no change in medication

## 2021-03-17 ENCOUNTER — Telehealth: Payer: Self-pay

## 2021-03-17 NOTE — Telephone Encounter (Signed)
FL2 form  Copied Noted [place] at [phone] when forms are ready for pickup.

## 2021-03-24 ENCOUNTER — Other Ambulatory Visit: Payer: Self-pay | Admitting: Family Medicine

## 2021-03-24 ENCOUNTER — Other Ambulatory Visit: Payer: Self-pay | Admitting: Internal Medicine

## 2021-03-24 DIAGNOSIS — K219 Gastro-esophageal reflux disease without esophagitis: Secondary | ICD-10-CM

## 2021-03-28 DIAGNOSIS — Z0279 Encounter for issue of other medical certificate: Secondary | ICD-10-CM

## 2021-03-28 NOTE — Telephone Encounter (Signed)
Called [place] at [phone] to let her know forms are ready for pick up.  Left voicemail. ?

## 2021-03-28 NOTE — Telephone Encounter (Signed)
Wendy Oneill picked up forms ?

## 2021-04-10 ENCOUNTER — Other Ambulatory Visit: Payer: Self-pay | Admitting: Family Medicine

## 2021-04-14 ENCOUNTER — Telehealth: Payer: Self-pay

## 2021-04-14 NOTE — Telephone Encounter (Signed)
RX Pharm Review forms ?[place] [phone] when completed.  ? ?Copied ?Noted ?sleeved ?

## 2021-04-21 NOTE — Telephone Encounter (Signed)
Called [phone] left voice mail for [facility] forms are ready for pick up. ?

## 2021-04-22 NOTE — Telephone Encounter (Signed)
Forms were picked up. 

## 2021-04-28 ENCOUNTER — Other Ambulatory Visit: Payer: Self-pay | Admitting: Family Medicine

## 2021-05-19 ENCOUNTER — Other Ambulatory Visit: Payer: Self-pay | Admitting: Internal Medicine

## 2021-05-19 ENCOUNTER — Other Ambulatory Visit: Payer: Self-pay | Admitting: Family Medicine

## 2021-05-19 DIAGNOSIS — K219 Gastro-esophageal reflux disease without esophagitis: Secondary | ICD-10-CM

## 2021-05-28 ENCOUNTER — Other Ambulatory Visit: Payer: Self-pay

## 2021-05-28 DIAGNOSIS — K219 Gastro-esophageal reflux disease without esophagitis: Secondary | ICD-10-CM

## 2021-05-28 MED ORDER — LOVASTATIN 20 MG PO TABS
20.0000 mg | ORAL_TABLET | Freq: Every day | ORAL | 2 refills | Status: DC
Start: 2021-05-28 — End: 2021-08-27

## 2021-05-28 MED ORDER — LISINOPRIL 2.5 MG PO TABS: 2.5000 mg | ORAL_TABLET | Freq: Every day | ORAL | 2 refills | Status: DC

## 2021-05-28 MED ORDER — JANUMET XR 50-1000 MG PO TB24: ORAL_TABLET | ORAL | 2 refills | Status: DC

## 2021-05-28 MED ORDER — FAMOTIDINE 20 MG PO TABS: 20.0000 mg | ORAL_TABLET | Freq: Two times a day (BID) | ORAL | 2 refills | Status: DC

## 2021-05-28 MED ORDER — OMEPRAZOLE 20 MG PO CPDR
DELAYED_RELEASE_CAPSULE | ORAL | 2 refills | Status: DC
Start: 2021-05-28 — End: 2021-08-27

## 2021-05-28 MED ORDER — FERROUS SULFATE 325 (65 FE) MG PO TABS: 325.0000 mg | ORAL_TABLET | Freq: Every day | ORAL | 2 refills | Status: DC

## 2021-05-28 MED ORDER — LORATADINE 10 MG PO TABS
ORAL_TABLET | ORAL | 2 refills | Status: DC
Start: 2021-05-28 — End: 2021-08-27

## 2021-06-23 ENCOUNTER — Ambulatory Visit: Payer: Medicaid Other | Admitting: Podiatry

## 2021-06-23 ENCOUNTER — Encounter: Payer: Self-pay | Admitting: Podiatry

## 2021-06-23 DIAGNOSIS — M79674 Pain in right toe(s): Secondary | ICD-10-CM

## 2021-06-23 DIAGNOSIS — B351 Tinea unguium: Secondary | ICD-10-CM

## 2021-06-23 DIAGNOSIS — M79675 Pain in left toe(s): Secondary | ICD-10-CM

## 2021-06-23 DIAGNOSIS — E1159 Type 2 diabetes mellitus with other circulatory complications: Secondary | ICD-10-CM

## 2021-06-23 NOTE — Progress Notes (Signed)
Complaint:  Visit Type: Patient returns to my office for continued preventative foot care services. Complaint: Patient states" my nails have grown long and thick and become painful to walk and wear shoes" Patient has been diagnosed with DM with no foot complications. The patient presents for preventative foot care services.  Podiatric Exam: Vascular: dorsalis pedis and posterior tibial pulses are palpable bilateral. Capillary return is immediate. Temperature gradient is WNL. Skin turgor WNL  Sensorium: Normal Semmes Weinstein monofilament test. Normal tactile sensation bilaterally. Nail Exam: Pt has thick disfigured discolored nails with subungual debris noted bilateral entire nail hallux through fifth toenails.  Hemorrhagic discoloration medial border right hallux.  Pain-free. Ulcer Exam: There is no evidence of ulcer or pre-ulcerative changes or infection. Orthopedic Exam: Muscle tone and strength are WNL. No limitations in general ROM. No crepitus or effusions noted. Foot type and digits show no abnormalities. Bony prominences are unremarkable. Skin: No Porokeratosis. No infection or ulcers  Diagnosis:  Onychomycosis, , Pain in right toe, pain in left toes  Treatment & Plan Procedures and Treatment: Consent by patient was obtained for treatment procedures.   Debridement of mycotic and hypertrophic toenails, 1 through 5 bilateral and clearing of subungual debris. No ulceration, no infection noted.   Return Visit-Office Procedure: Patient instructed to return to the office for a follow up visit 3 months for continued evaluation and treatment.    Gardiner Barefoot DPM

## 2021-07-02 ENCOUNTER — Other Ambulatory Visit: Payer: Self-pay | Admitting: Family Medicine

## 2021-07-18 ENCOUNTER — Other Ambulatory Visit: Payer: Self-pay | Admitting: Family Medicine

## 2021-07-18 ENCOUNTER — Other Ambulatory Visit: Payer: Self-pay | Admitting: Internal Medicine

## 2021-07-29 ENCOUNTER — Telehealth: Payer: Self-pay | Admitting: Family Medicine

## 2021-07-29 NOTE — Telephone Encounter (Signed)
Pt family member dropped off her [facility] pharmacy review for Dr. Moshe Cipro to complete.           (Sleeved/copied/noted)

## 2021-08-04 ENCOUNTER — Other Ambulatory Visit: Payer: Self-pay | Admitting: Family Medicine

## 2021-08-11 NOTE — Telephone Encounter (Signed)
Forms are ready for pick, Wendy Oneill will send son to pick up forms Patient in the [facility].

## 2021-08-27 ENCOUNTER — Other Ambulatory Visit: Payer: Self-pay | Admitting: Family Medicine

## 2021-08-27 DIAGNOSIS — K219 Gastro-esophageal reflux disease without esophagitis: Secondary | ICD-10-CM

## 2021-09-25 ENCOUNTER — Other Ambulatory Visit: Payer: Self-pay | Admitting: Family Medicine

## 2021-10-06 ENCOUNTER — Other Ambulatory Visit: Payer: Self-pay | Admitting: Family Medicine

## 2021-10-10 ENCOUNTER — Encounter: Payer: Medicaid Other | Admitting: Family Medicine

## 2021-10-20 ENCOUNTER — Ambulatory Visit: Payer: Medicaid Other | Admitting: Podiatry

## 2021-10-28 ENCOUNTER — Other Ambulatory Visit: Payer: Self-pay

## 2021-10-28 ENCOUNTER — Telehealth: Payer: Self-pay | Admitting: Family Medicine

## 2021-10-28 MED ORDER — ASPIRIN 81 MG PO TBEC: DELAYED_RELEASE_TABLET | ORAL | 10 refills | Status: DC

## 2021-10-28 NOTE — Telephone Encounter (Signed)
Pa submitted on cover my meds 10/27/21 Asprin refilled to [facility]

## 2021-10-28 NOTE — Telephone Encounter (Signed)
Leandra Kern. [facility] called in on patient behalf.  Awaiting Prior auth on  JANUMET XR 50-1000 MG TB24  ( Asked to re fax over to office )    Also needs refill on aspirin EC (ASPIRIN ADULT LOW STRENGTH) 81 MG tablet

## 2021-10-31 ENCOUNTER — Encounter: Payer: Medicaid Other | Admitting: Family Medicine

## 2021-11-11 ENCOUNTER — Ambulatory Visit: Payer: Medicaid Other | Admitting: Podiatry

## 2021-11-19 ENCOUNTER — Other Ambulatory Visit: Payer: Self-pay | Admitting: Family Medicine

## 2021-11-28 ENCOUNTER — Other Ambulatory Visit (HOSPITAL_COMMUNITY): Payer: Self-pay | Admitting: Internal Medicine

## 2021-11-28 DIAGNOSIS — Z1231 Encounter for screening mammogram for malignant neoplasm of breast: Secondary | ICD-10-CM

## 2021-12-10 ENCOUNTER — Ambulatory Visit (HOSPITAL_COMMUNITY)
Admission: RE | Admit: 2021-12-10 | Discharge: 2021-12-10 | Disposition: A | Discharge status: Home / Self Care | Payer: Medicaid Other | Source: Ambulatory Visit | Attending: Internal Medicine | Admitting: Internal Medicine

## 2021-12-10 ENCOUNTER — Encounter: Payer: Medicaid Other | Admitting: Family Medicine

## 2021-12-10 DIAGNOSIS — Z1231 Encounter for screening mammogram for malignant neoplasm of breast: Secondary | ICD-10-CM | POA: Insufficient documentation

## 2022-01-21 ENCOUNTER — Ambulatory Visit: Payer: Medicaid Other | Admitting: Podiatry

## 2022-05-12 ENCOUNTER — Other Ambulatory Visit: Payer: Self-pay | Admitting: Family Medicine

## 2022-07-01 ENCOUNTER — Ambulatory Visit (INDEPENDENT_AMBULATORY_CARE_PROVIDER_SITE_OTHER): Payer: Medicare Other | Admitting: Podiatry

## 2022-07-01 ENCOUNTER — Encounter: Payer: Self-pay | Admitting: Podiatry

## 2022-07-01 DIAGNOSIS — B351 Tinea unguium: Secondary | ICD-10-CM | POA: Diagnosis not present

## 2022-07-01 DIAGNOSIS — M79675 Pain in left toe(s): Secondary | ICD-10-CM

## 2022-07-01 DIAGNOSIS — E1159 Type 2 diabetes mellitus with other circulatory complications: Secondary | ICD-10-CM

## 2022-07-01 DIAGNOSIS — M79674 Pain in right toe(s): Secondary | ICD-10-CM

## 2022-07-01 NOTE — Progress Notes (Signed)
Complaint:  °Visit Type: Patient returns to my office for continued preventative foot care services. Complaint: Patient states" my nails have grown long and thick and become painful to walk and wear shoes" Patient has been diagnosed with DM with no foot complications. The patient presents for preventative foot care services. ° °Podiatric Exam: °Vascular: dorsalis pedis and posterior tibial pulses are palpable bilateral. Capillary return is immediate. Temperature gradient is WNL. Skin turgor WNL  °Sensorium: Normal Semmes Weinstein monofilament test. Normal tactile sensation bilaterally. °Nail Exam: Pt has thick disfigured discolored nails with subungual debris noted bilateral entire nail hallux through fifth toenails.  Hemorrhagic discoloration medial border right hallux.  Pain-free. °Ulcer Exam: There is no evidence of ulcer or pre-ulcerative changes or infection. °Orthopedic Exam: Muscle tone and strength are WNL. No limitations in general ROM. No crepitus or effusions noted. Foot type and digits show no abnormalities. Bony prominences are unremarkable. °Skin: No Porokeratosis. No infection or ulcers ° °Diagnosis:  °Onychomycosis, , Pain in right toe, pain in left toes ° °Treatment & Plan °Procedures and Treatment: Consent by patient was obtained for treatment procedures.   Debridement of mycotic and hypertrophic toenails, 1 through 5 bilateral and clearing of subungual debris. No ulceration, no infection noted.   °Return Visit-Office Procedure: Patient instructed to return to the office for a follow up visit 3 months for continued evaluation and treatment. ° ° ° °Belle Charlie DPM °

## 2022-07-15 ENCOUNTER — Other Ambulatory Visit: Payer: Self-pay | Admitting: Family Medicine

## 2022-08-19 ENCOUNTER — Other Ambulatory Visit: Payer: Self-pay | Admitting: Family Medicine

## 2022-08-27 ENCOUNTER — Other Ambulatory Visit: Payer: Self-pay | Admitting: Family Medicine

## 2022-08-27 DIAGNOSIS — K219 Gastro-esophageal reflux disease without esophagitis: Secondary | ICD-10-CM

## 2022-09-28 ENCOUNTER — Other Ambulatory Visit: Payer: Self-pay | Admitting: Family Medicine

## 2022-10-06 ENCOUNTER — Other Ambulatory Visit: Payer: Self-pay | Admitting: Family Medicine

## 2022-10-23 ENCOUNTER — Other Ambulatory Visit: Payer: Self-pay | Admitting: Family Medicine

## 2022-11-02 ENCOUNTER — Ambulatory Visit (INDEPENDENT_AMBULATORY_CARE_PROVIDER_SITE_OTHER): Payer: Medicare Other | Admitting: Podiatry

## 2022-11-02 ENCOUNTER — Encounter: Payer: Self-pay | Admitting: Podiatry

## 2022-11-02 DIAGNOSIS — B351 Tinea unguium: Secondary | ICD-10-CM

## 2022-11-02 DIAGNOSIS — M79674 Pain in right toe(s): Secondary | ICD-10-CM

## 2022-11-02 DIAGNOSIS — E1159 Type 2 diabetes mellitus with other circulatory complications: Secondary | ICD-10-CM | POA: Diagnosis not present

## 2022-11-02 DIAGNOSIS — M79675 Pain in left toe(s): Secondary | ICD-10-CM | POA: Diagnosis not present

## 2022-11-02 NOTE — Progress Notes (Signed)
Complaint:  Visit Type: Patient returns to my office for continued preventative foot care services. Complaint: Patient states" my nails have grown long and thick and become painful to walk and wear shoes" Patient has been diagnosed with DM with no foot complications. The patient presents for preventative foot care services.  Podiatric Exam: Vascular: dorsalis pedis and posterior tibial pulses are palpable bilateral. Capillary return is immediate. Temperature gradient is WNL. Skin turgor WNL  Sensorium: Normal Semmes Weinstein monofilament test. Normal tactile sensation bilaterally. Nail Exam: Pt has thick disfigured discolored nails with subungual debris noted bilateral entire nail hallux through fifth toenails Ulcer Exam: There is no evidence of ulcer or pre-ulcerative changes or infection. Orthopedic Exam: Muscle tone and strength are WNL. No limitations in general ROM. No crepitus or effusions noted. Foot type and digits show no abnormalities. Bony prominences are unremarkable. Skin: No Porokeratosis. No infection or ulcers  Diagnosis:  Onychomycosis, , Pain in right toe, pain in left toes  Treatment & Plan Procedures and Treatment: Consent by patient was obtained for treatment procedures.   Debridement of mycotic and hypertrophic toenails, 1 through 5 bilateral and clearing of subungual debris. No ulceration, no infection noted.  Return Visit-Office Procedure: Patient instructed to return to the office for a follow up visit 4  months for continued evaluation and treatment.    Helane Gunther DPM

## 2022-11-03 ENCOUNTER — Other Ambulatory Visit: Payer: Self-pay | Admitting: Family Medicine

## 2022-12-08 ENCOUNTER — Other Ambulatory Visit: Payer: Self-pay | Admitting: Family Medicine

## 2022-12-15 ENCOUNTER — Encounter: Payer: Self-pay | Admitting: *Deleted

## 2022-12-28 IMAGING — MG MM DIGITAL SCREENING BILAT W/ TOMO AND CAD
6 of 10 series · 6 of 30 positions shown · non-contrast
Comparison: Previous exam(s).

CLINICAL DATA: Screening.

EXAM:
DIGITAL SCREENING BILATERAL MAMMOGRAM WITH TOMOSYNTHESIS AND CAD
TECHNIQUE: Bilateral screening digital craniocaudal and mediolateral oblique
mammograms were obtained. Bilateral screening digital breast
tomosynthesis was performed. The images were evaluated with
computer-aided detection.

[L CC synth-2D (1 of 2)]
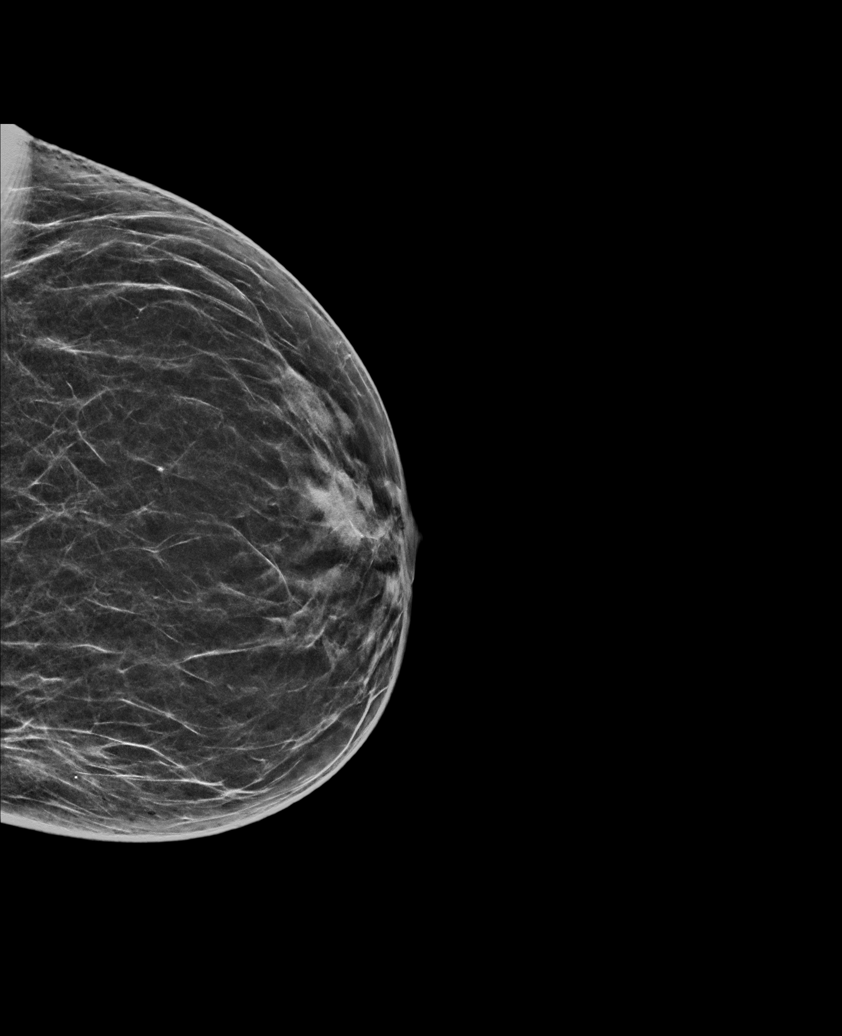

[L CC synth-2D (2 of 2)]
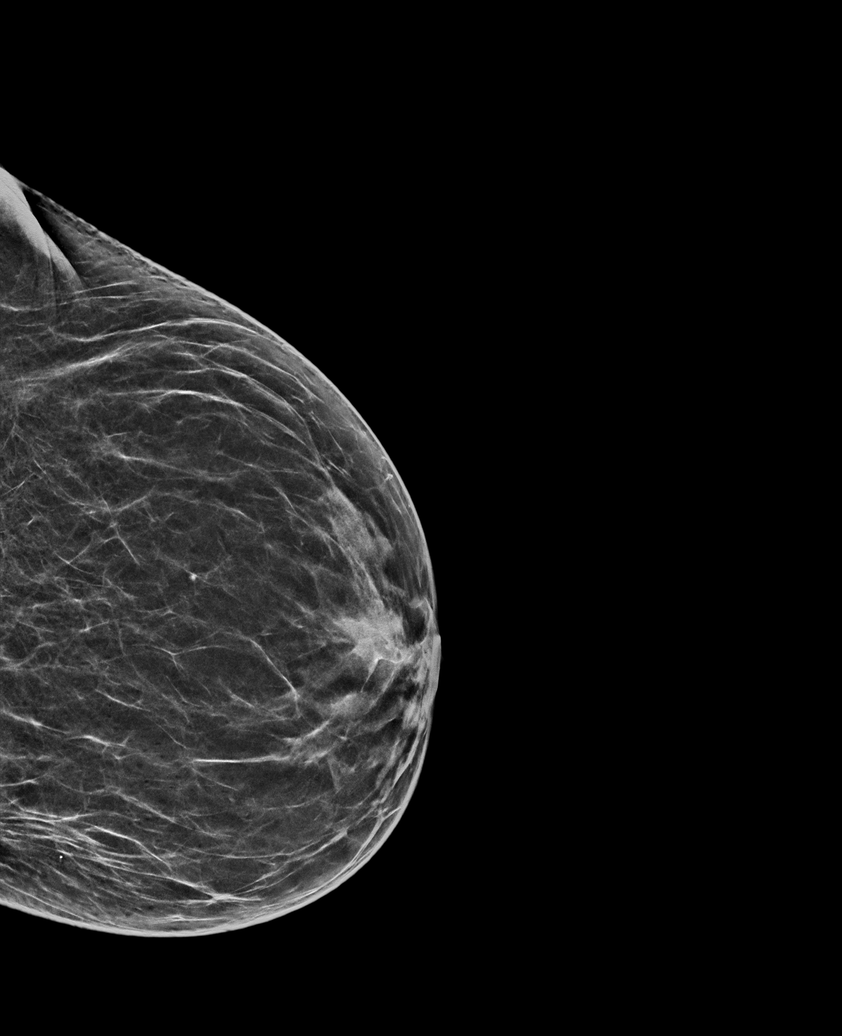

[R CC synth-2D]
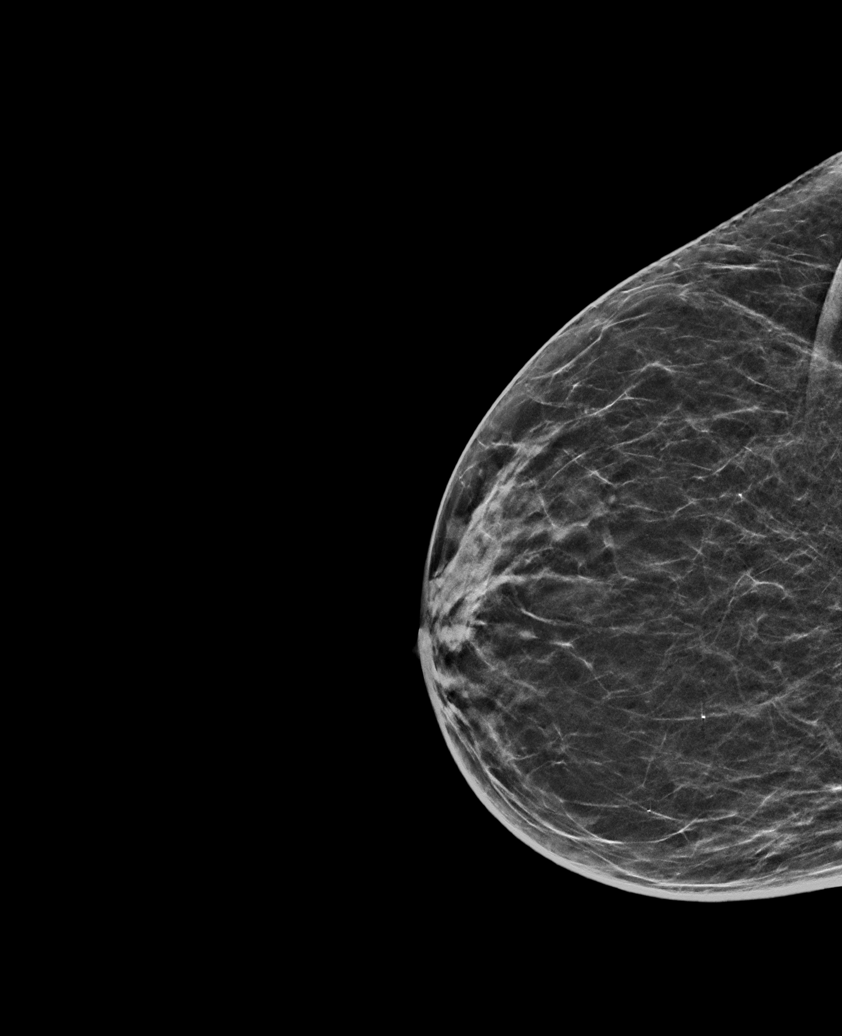

[L MLO synth-2D]
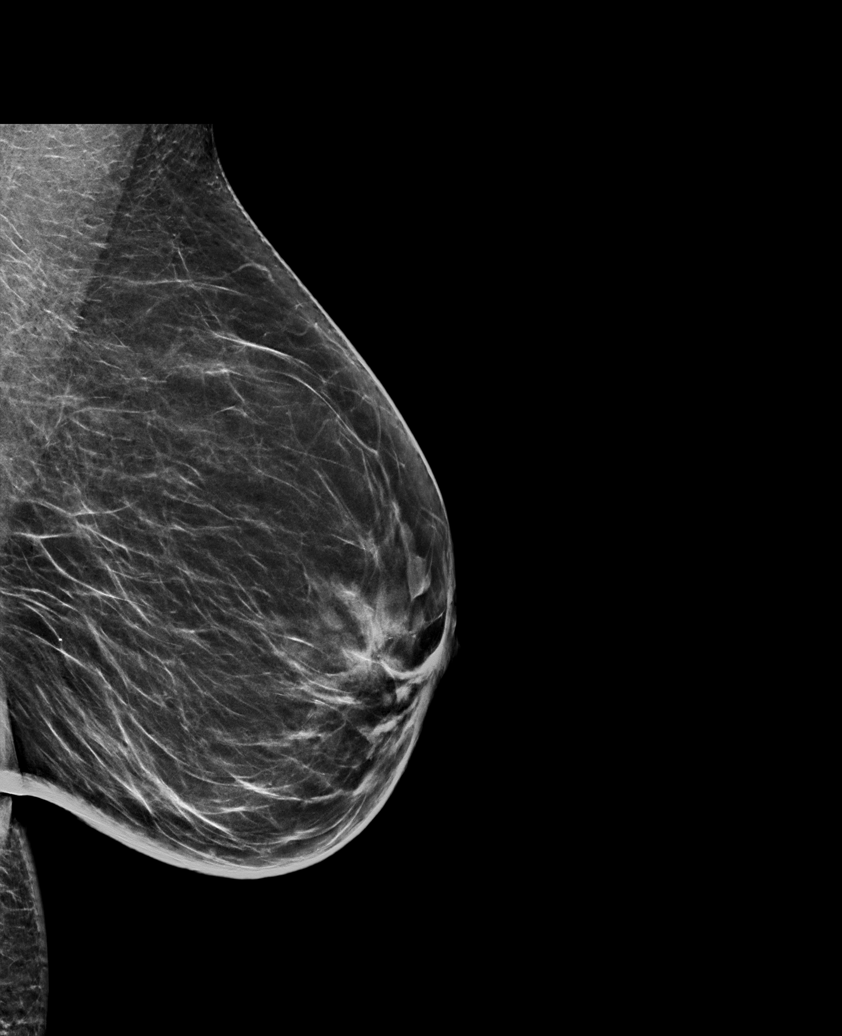

[R MLO synth-2D]
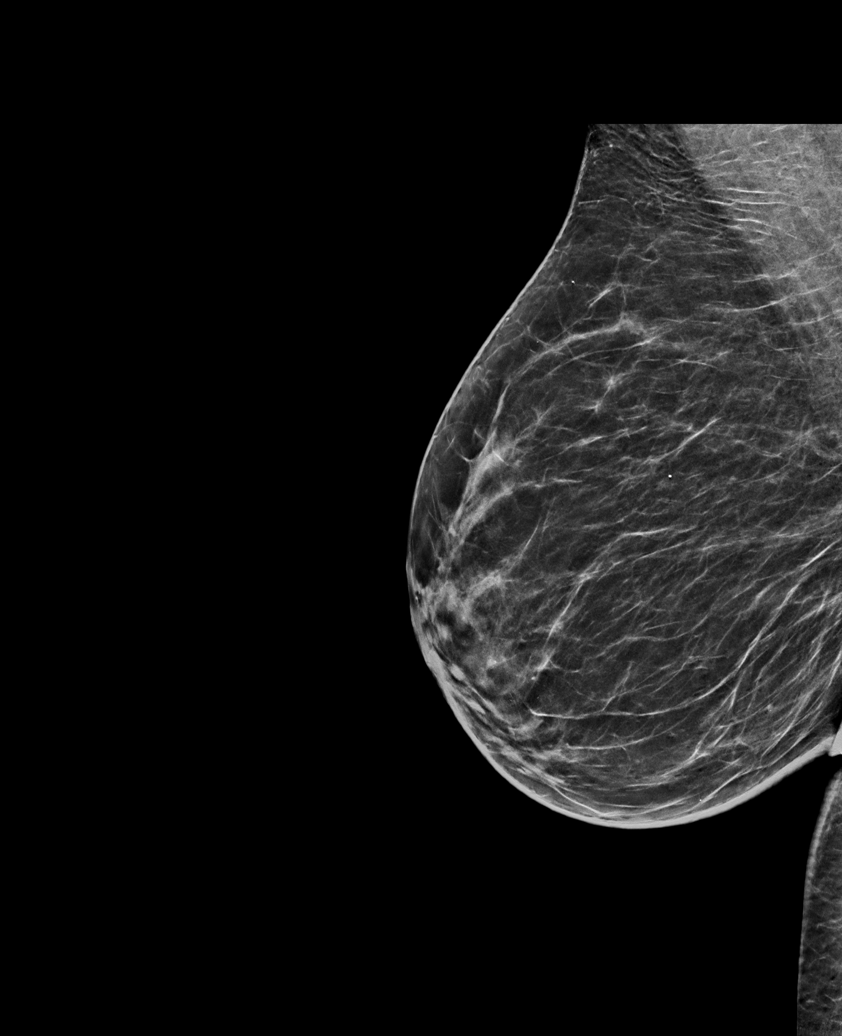

[L CC tomo · tomo slice 29/57.0]
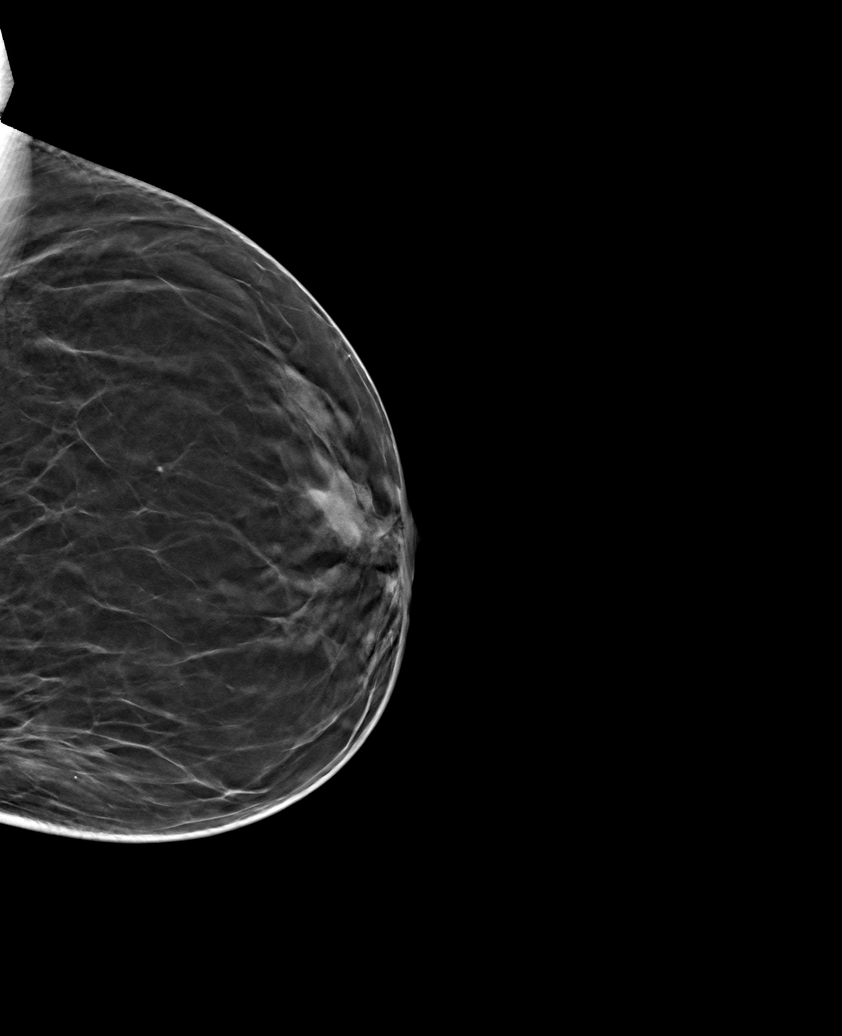

[6 of 30 positions shown; findings below may reference images not displayed]

ACR Breast Density Category b: There are scattered areas of
fibroglandular density.
FINDINGS: There are no findings suspicious for malignancy.
IMPRESSION: No mammographic evidence of malignancy. A result letter of this
screening mammogram will be mailed directly to the patient.

RECOMMENDATION:
Screening mammogram in one year. (Code:51-O-LD2)

BI-RADS CATEGORY  1: Negative.

## 2023-01-28 ENCOUNTER — Telehealth: Payer: Self-pay

## 2023-01-28 NOTE — Telephone Encounter (Signed)
 Copied from CRM [id number]. Topic: Clinical - Prescription Issue >> Jan 27, 2023 10:00 AM Deleta HERO wrote: Reason for CRM: Vernell from [facility] needs clarification on why the Lancets for the patient was denied, she states she faxed over the request and received a denial from [facility]. Vernell said the patient is completely out of this prescription and urgently needs a refill.  Callback #: [phone]

## 2023-03-05 ENCOUNTER — Ambulatory Visit: Payer: Medicare Other | Admitting: Podiatry

## 2023-03-08 ENCOUNTER — Encounter: Payer: Self-pay | Admitting: Podiatry

## 2023-03-08 ENCOUNTER — Ambulatory Visit (INDEPENDENT_AMBULATORY_CARE_PROVIDER_SITE_OTHER): Payer: Medicare Other | Admitting: Podiatry

## 2023-03-08 ENCOUNTER — Ambulatory Visit: Payer: Medicare Other | Admitting: Podiatry

## 2023-03-08 DIAGNOSIS — M79674 Pain in right toe(s): Secondary | ICD-10-CM

## 2023-03-08 DIAGNOSIS — E1159 Type 2 diabetes mellitus with other circulatory complications: Secondary | ICD-10-CM

## 2023-03-08 DIAGNOSIS — B351 Tinea unguium: Secondary | ICD-10-CM

## 2023-03-08 DIAGNOSIS — M79675 Pain in left toe(s): Secondary | ICD-10-CM | POA: Diagnosis not present

## 2023-03-08 NOTE — Progress Notes (Signed)
 Complaint:  Visit Type: Patient returns to my office for continued preventative foot care services. Complaint: Patient states" my nails have grown long and thick and become painful to walk and wear shoes" Patient has been diagnosed with DM with no foot complications. The patient presents for preventative foot care services.  Podiatric Exam: Vascular: dorsalis pedis and posterior tibial pulses are weakly  palpable bilateral. Capillary return is immediate. Temperature gradient is WNL. Skin turgor WNL  Sensorium: Normal Semmes Weinstein monofilament test. Normal tactile sensation bilaterally. Nail Exam: Pt has thick disfigured discolored nails with subungual debris noted bilateral entire nail hallux through fifth toenails.  Ulcer Exam: There is no evidence of ulcer or pre-ulcerative changes or infection. Orthopedic Exam: Muscle tone and strength are WNL. No limitations in general ROM. No crepitus or effusions noted. Foot type and digits show no abnormalities. Bony prominences are unremarkable. Skin: No Porokeratosis. No infection or ulcers  Diagnosis:  Onychomycosis, , Pain in right toe, pain in left toes  Treatment & Plan Procedures and Treatment: Consent by patient was obtained for treatment procedures.   Debridement of mycotic and hypertrophic toenails, 1 through 5 bilateral and clearing of subungual debris. No ulceration, no infection noted.   Return Visit-Office Procedure: Patient instructed to return to the office for a follow up visit 4 months for continued evaluation and treatment.    Helane Gunther DPM

## 2023-04-13 ENCOUNTER — Other Ambulatory Visit: Payer: Self-pay | Admitting: Family Medicine

## 2023-04-14 NOTE — Telephone Encounter (Signed)
 Rachael with [facility] called in inquiring about why the patients EASYMAX TEST test strip [Pharmacy Med Name: EASY MAX TEST STRIPS] [[id number]] was denied. After speaking with Caelyn I was informed to just send a message to the provider to specify why the refill was not appropriate. I did tell Rachael the patient hadn't been seen in over 2 years and that maybe why but that I would relay the message to the provider and have someone call her back. Please assist further

## 2023-04-21 ENCOUNTER — Ambulatory Visit: Payer: Self-pay

## 2023-04-21 NOTE — Telephone Encounter (Signed)
 [facility] called for clarification about why patient's test strips were denied. Phone number taken so they can be called back. Patient had previously been seen by [facility] per chart review. CAL for [facility] called who verified that this patient is no longer a patient of theirs. Phone number for current provider listed in Epic was obtained through chart review. [facility] was called back and spoke with [facility]. Updated on provider and gave phone number to be able to contact. [phone]  Copied from CRM [id number]. Topic: Clinical - Medication Question >> Apr 21, 2023 10:09 AM Izetta Dakin wrote: Reason for CRM: Patient's pharmacy requesting additional information on medication. [phone] Reason for Disposition  [1] Caller requests to speak ONLY to PCP AND [2] NON-URGENT question  Answer Assessment - Initial Assessment Questions 1. REASON FOR CALL or QUESTION: "What is your reason for calling today?" or "How can I best help you?" or "What question do you have that I can help answer?"     [facility] calling for clarification about why test strips were refused.  2. CALLER: Document the source of call. (e.g., laboratory, patient).     pharmacy  Protocols used: PCP Call - No Triage-A-AH

## 2023-06-24 ENCOUNTER — Other Ambulatory Visit: Payer: Self-pay | Admitting: Family Medicine

## 2023-07-06 ENCOUNTER — Ambulatory Visit (INDEPENDENT_AMBULATORY_CARE_PROVIDER_SITE_OTHER): Payer: Medicare Other | Admitting: Podiatry

## 2023-07-06 ENCOUNTER — Encounter: Payer: Self-pay | Admitting: Podiatry

## 2023-07-06 DIAGNOSIS — M79675 Pain in left toe(s): Secondary | ICD-10-CM

## 2023-07-06 DIAGNOSIS — B351 Tinea unguium: Secondary | ICD-10-CM | POA: Diagnosis not present

## 2023-07-06 DIAGNOSIS — M79674 Pain in right toe(s): Secondary | ICD-10-CM | POA: Diagnosis not present

## 2023-07-06 DIAGNOSIS — E1159 Type 2 diabetes mellitus with other circulatory complications: Secondary | ICD-10-CM | POA: Diagnosis not present

## 2023-07-06 NOTE — Progress Notes (Signed)
 Complaint:  Visit Type: Patient returns to my office for continued preventative foot care services. Complaint: Patient states" my nails have grown long and thick and become painful to walk and wear shoes" Patient has been diagnosed with DM with no foot complications. The patient presents for preventative foot care services.  Podiatric Exam: Vascular: dorsalis pedis and posterior tibial pulses are weakly  palpable bilateral. Capillary return is immediate. Temperature gradient is WNL. Skin turgor WNL  Sensorium: Normal Semmes Weinstein monofilament test. Normal tactile sensation bilaterally. Nail Exam: Pt has thick disfigured discolored nails with subungual debris noted bilateral entire nail hallux through fifth toenails.  Ulcer Exam: There is no evidence of ulcer or pre-ulcerative changes or infection. Orthopedic Exam: Muscle tone and strength are WNL. No limitations in general ROM. No crepitus or effusions noted. Foot type and digits show no abnormalities. Bony prominences are unremarkable. Skin: No Porokeratosis. No infection or ulcers  Diagnosis:  Onychomycosis, , Pain in right toe, pain in left toes  Treatment & Plan Procedures and Treatment: Consent by patient was obtained for treatment procedures.   Debridement of mycotic and hypertrophic toenails, 1 through 5 bilateral and clearing of subungual debris. No ulceration, no infection noted.   Return Visit-Office Procedure: Patient instructed to return to the office for a follow up visit 4 months for continued evaluation and treatment.    Helane Gunther DPM

## 2023-08-12 ENCOUNTER — Other Ambulatory Visit: Payer: Self-pay | Admitting: Internal Medicine

## 2023-08-12 NOTE — Telephone Encounter (Signed)
 Copied from CRM #[id number]. Topic: Clinical - Medication Refill >> Aug 12, 2023 11:50 AM Berwyn MATSU wrote: Medication: Accu-Chek Softclix Lancets lancets; per [facility] called in to advise that patient needs fills as she is completely out of fills for the lancets.   Has the patient contacted their pharmacy? Yes (Agent: If no, request that the patient contact the pharmacy for the refill. If patient does not wish to contact the pharmacy document the reason why and proceed with request.) (Agent: If yes, when and what did the pharmacy advise?)  This is the patient's preferred pharmacy:  [facility] [place] - [address] [place] [address] Phone: [phone] Fax: [phone]  Is this the correct pharmacy for this prescription? Yes If no, delete pharmacy and type the correct one.   Has the prescription been filled recently? Yes  Is the patient out of the medication? Yes  Has the patient been seen for an appointment in the last year OR does the patient have an upcoming appointment? Yes  Can we respond through MyChart?  NO  Agent: Please be advised that Rx refills may take up to 3 business days. We ask that you follow-up with your pharmacy.

## 2023-08-18 ENCOUNTER — Other Ambulatory Visit: Payer: Self-pay | Admitting: Family Medicine

## 2023-09-29 ENCOUNTER — Other Ambulatory Visit: Payer: Self-pay | Admitting: Family Medicine

## 2023-10-06 ENCOUNTER — Ambulatory Visit (INDEPENDENT_AMBULATORY_CARE_PROVIDER_SITE_OTHER): Admitting: Podiatry

## 2023-10-06 ENCOUNTER — Encounter: Payer: Self-pay | Admitting: Podiatry

## 2023-10-06 DIAGNOSIS — E1159 Type 2 diabetes mellitus with other circulatory complications: Secondary | ICD-10-CM | POA: Diagnosis not present

## 2023-10-06 DIAGNOSIS — M79674 Pain in right toe(s): Secondary | ICD-10-CM

## 2023-10-06 DIAGNOSIS — M79675 Pain in left toe(s): Secondary | ICD-10-CM

## 2023-10-06 DIAGNOSIS — B351 Tinea unguium: Secondary | ICD-10-CM | POA: Diagnosis not present

## 2023-10-06 NOTE — Progress Notes (Signed)
 Complaint:  Visit Type: Patient returns to my office for continued preventative foot care services. Complaint: Patient states" my nails have grown long and thick and become painful to walk and wear shoes" Patient has been diagnosed with DM with no foot complications. The patient presents for preventative foot care services.  Podiatric Exam: Vascular: dorsalis pedis and posterior tibial pulses are weakly  palpable bilateral. Capillary return is immediate. Temperature gradient is WNL. Skin turgor WNL  Sensorium: Normal Semmes Weinstein monofilament test. Normal tactile sensation bilaterally. Nail Exam: Pt has thick disfigured discolored nails with subungual debris noted bilateral entire nail hallux through fifth toenails.  Ulcer Exam: There is no evidence of ulcer or pre-ulcerative changes or infection. Orthopedic Exam: Muscle tone and strength are WNL. No limitations in general ROM. No crepitus or effusions noted. Foot type and digits show no abnormalities. Bony prominences are unremarkable. Skin: No Porokeratosis. No infection or ulcers  Diagnosis:  Onychomycosis, , Pain in right toe, pain in left toes  Treatment & Plan Procedures and Treatment: Consent by patient was obtained for treatment procedures.   Debridement of mycotic and hypertrophic toenails, 1 through 5 bilateral and clearing of subungual debris. No ulceration, no infection noted.   Return Visit-Office Procedure: Patient instructed to return to the office for a follow up visit 4 months for continued evaluation and treatment.    Helane Gunther DPM

## 2023-11-24 ENCOUNTER — Encounter (INDEPENDENT_AMBULATORY_CARE_PROVIDER_SITE_OTHER): Payer: Self-pay | Admitting: *Deleted

## 2023-12-01 ENCOUNTER — Other Ambulatory Visit (HOSPITAL_COMMUNITY): Payer: Self-pay | Admitting: Internal Medicine

## 2023-12-01 DIAGNOSIS — Z1231 Encounter for screening mammogram for malignant neoplasm of breast: Secondary | ICD-10-CM

## 2023-12-27 ENCOUNTER — Ambulatory Visit (HOSPITAL_COMMUNITY)

## 2024-01-05 ENCOUNTER — Encounter: Payer: Self-pay | Admitting: Podiatry

## 2024-01-05 ENCOUNTER — Ambulatory Visit: Admitting: Podiatry

## 2024-01-05 DIAGNOSIS — B351 Tinea unguium: Secondary | ICD-10-CM

## 2024-01-05 DIAGNOSIS — M79675 Pain in left toe(s): Secondary | ICD-10-CM

## 2024-01-05 DIAGNOSIS — E1159 Type 2 diabetes mellitus with other circulatory complications: Secondary | ICD-10-CM | POA: Diagnosis not present

## 2024-01-05 DIAGNOSIS — M79674 Pain in right toe(s): Secondary | ICD-10-CM

## 2024-01-05 NOTE — Progress Notes (Signed)
 Complaint:  Visit Type: Patient returns to my office for continued preventative foot care services. Complaint: Patient states" my nails have grown long and thick and become painful to walk and wear shoes" Patient has been diagnosed with DM with no foot complications. The patient presents for preventative foot care services.  Podiatric Exam: Vascular: dorsalis pedis and posterior tibial pulses are weakly  palpable bilateral. Capillary return is immediate. Temperature gradient is WNL. Skin turgor WNL  Sensorium: Normal Semmes Weinstein monofilament test. Normal tactile sensation bilaterally. Nail Exam: Pt has thick disfigured discolored nails with subungual debris noted bilateral entire nail hallux through fifth toenails.  Ulcer Exam: There is no evidence of ulcer or pre-ulcerative changes or infection. Orthopedic Exam: Muscle tone and strength are WNL. No limitations in general ROM. No crepitus or effusions noted. Foot type and digits show no abnormalities. Bony prominences are unremarkable. Skin: No Porokeratosis. No infection or ulcers  Diagnosis:  Onychomycosis, , Pain in right toe, pain in left toes  Treatment & Plan Procedures and Treatment: Consent by patient was obtained for treatment procedures.   Debridement of mycotic and hypertrophic toenails, 1 through 5 bilateral and clearing of subungual debris. No ulceration, no infection noted.   Return Visit-Office Procedure: Patient instructed to return to the office for a follow up visit 4 months for continued evaluation and treatment.    Helane Gunther DPM

## 2024-02-14 ENCOUNTER — Other Ambulatory Visit (HOSPITAL_COMMUNITY): Payer: Self-pay | Admitting: Internal Medicine

## 2024-02-14 DIAGNOSIS — Z1231 Encounter for screening mammogram for malignant neoplasm of breast: Secondary | ICD-10-CM

## 2024-04-05 ENCOUNTER — Ambulatory Visit: Admitting: Podiatry

## 2024-10-25 ENCOUNTER — Ambulatory Visit (HOSPITAL_COMMUNITY)
